# Patient Record
Sex: Male | Born: 1940
Health system: Southern US, Community
[De-identification: ages and names within clinical notes are randomized; demographics above are authoritative.]

## PROBLEM LIST (undated history)

## (undated) DIAGNOSIS — I10 Essential (primary) hypertension: Secondary | ICD-10-CM

## (undated) DIAGNOSIS — I4891 Unspecified atrial fibrillation: Secondary | ICD-10-CM

## (undated) DIAGNOSIS — I420 Dilated cardiomyopathy: Secondary | ICD-10-CM

## (undated) DIAGNOSIS — K219 Gastro-esophageal reflux disease without esophagitis: Secondary | ICD-10-CM

## (undated) DIAGNOSIS — G5603 Carpal tunnel syndrome, bilateral upper limbs: Principal | ICD-10-CM

## (undated) DIAGNOSIS — R0989 Other specified symptoms and signs involving the circulatory and respiratory systems: Secondary | ICD-10-CM

## (undated) DIAGNOSIS — I472 Ventricular tachycardia: Secondary | ICD-10-CM

## (undated) DIAGNOSIS — I4729 Other ventricular tachycardia: Secondary | ICD-10-CM

## (undated) DIAGNOSIS — G5620 Lesion of ulnar nerve, unspecified upper limb: Secondary | ICD-10-CM

## (undated) HISTORY — PX: TRANSURETHRAL RESECTION OF PROSTATE: SHX73

## (undated) HISTORY — PX: HEMORRHOID SURGERY: SHX153

## (undated) HISTORY — DX: Lesion of ulnar nerve, unspecified upper limb: G56.20

## (undated) HISTORY — PX: BACK SURGERY: SHX140

## (undated) HISTORY — DX: Dilated cardiomyopathy: I42.0

## (undated) HISTORY — DX: Ventricular tachycardia: I47.2

## (undated) HISTORY — PX: HERNIA REPAIR: SHX51

## (undated) HISTORY — PX: JOINT REPLACEMENT: SHX530

## (undated) HISTORY — PX: CARDIAC CATHETERIZATION: SHX172

## (undated) HISTORY — DX: Carpal tunnel syndrome, bilateral upper limbs: G56.03

## (undated) HISTORY — PX: FOOT SURGERY: SHX648

## (undated) HISTORY — DX: Other ventricular tachycardia: I47.29

## (undated) HISTORY — DX: Unspecified atrial fibrillation: I48.91

## (undated) HISTORY — PX: CHOLECYSTECTOMY: SHX55

---

## 2000-05-17 ENCOUNTER — Encounter (INDEPENDENT_AMBULATORY_CARE_PROVIDER_SITE_OTHER): Payer: Self-pay | Admitting: *Deleted

## 2000-05-17 ENCOUNTER — Ambulatory Visit (HOSPITAL_BASED_OUTPATIENT_CLINIC_OR_DEPARTMENT_OTHER): Admission: RE | Admit: 2000-05-17 | Discharge: 2000-05-17 | Payer: Self-pay | Admitting: Surgery

## 2009-06-25 ENCOUNTER — Encounter: Admission: RE | Admit: 2009-06-25 | Discharge: 2009-06-25 | Payer: Self-pay | Admitting: Neurosurgery

## 2009-10-01 ENCOUNTER — Encounter: Admission: RE | Admit: 2009-10-01 | Discharge: 2009-10-01 | Payer: Self-pay | Admitting: Neurosurgery

## 2009-12-23 ENCOUNTER — Inpatient Hospital Stay (HOSPITAL_COMMUNITY): Admission: RE | Admit: 2009-12-23 | Discharge: 2009-12-25 | Payer: Self-pay | Admitting: Orthopedic Surgery

## 2010-07-07 ENCOUNTER — Inpatient Hospital Stay (HOSPITAL_COMMUNITY): Admission: RE | Admit: 2010-07-07 | Discharge: 2010-07-09 | Payer: Self-pay | Admitting: Orthopedic Surgery

## 2011-01-08 LAB — DIFFERENTIAL
Basophils Relative: 1 % (ref 0–1)
Eosinophils Absolute: 0.2 10*3/uL (ref 0.0–0.7)
Eosinophils Relative: 3 % (ref 0–5)
Monocytes Absolute: 0.4 10*3/uL (ref 0.1–1.0)
Monocytes Relative: 8 % (ref 3–12)
Neutro Abs: 3.9 10*3/uL (ref 1.7–7.7)

## 2011-01-08 LAB — COMPREHENSIVE METABOLIC PANEL
ALT: 19 U/L (ref 0–53)
AST: 20 U/L (ref 0–37)
Albumin: 4.1 g/dL (ref 3.5–5.2)
Alkaline Phosphatase: 77 U/L (ref 39–117)
CO2: 28 mEq/L (ref 19–32)
Creatinine, Ser: 1 mg/dL (ref 0.4–1.5)
GFR calc Af Amer: >60 mL/min (ref >60)
Sodium: 139 mEq/L (ref 135–145)
Total Protein: 6.3 g/dL (ref 6.0–8.3)

## 2011-01-08 LAB — BASIC METABOLIC PANEL
BUN: 11 mg/dL (ref 6–23)
BUN: 23 mg/dL (ref 6–23)
CO2: 26 mEq/L (ref 19–32)
CO2: 28 mEq/L (ref 19–32)
Creatinine, Ser: 1 mg/dL (ref 0.4–1.5)
GFR calc Af Amer: >60 mL/min (ref >60)
GFR calc non Af Amer: >60 mL/min (ref >60)
Glucose, Bld: 175 mg/dL — ABNORMAL HIGH (ref 70–99)
Potassium: 4.5 mEq/L (ref 3.5–5.1)
Sodium: 136 mEq/L (ref 135–145)
Sodium: 137 mEq/L (ref 135–145)

## 2011-01-08 LAB — CBC
HCT: 29.6 % — ABNORMAL LOW (ref 39.0–52.0)
HCT: 44.2 % (ref 39.0–52.0)
Hemoglobin: 10.1 g/dL — ABNORMAL LOW (ref 13.0–17.0)
Hemoglobin: 15.7 g/dL (ref 13.0–17.0)
MCHC: 34.1 g/dL (ref 30.0–36.0)
MCHC: 34.7 g/dL (ref 30.0–36.0)
MCHC: 35.5 g/dL (ref 30.0–36.0)
MCV: 92.2 fL (ref 78.0–100.0)
Platelets: 187 10*3/uL (ref 150–400)
RBC: 3.21 MIL/uL — ABNORMAL LOW (ref 4.22–5.81)
RBC: 4.97 MIL/uL (ref 4.22–5.81)
RDW: 13.3 % (ref 11.5–15.5)
RDW: 13.7 % (ref 11.5–15.5)
WBC: 10.5 10*3/uL (ref 4.0–10.5)
WBC: 14.2 10*3/uL — ABNORMAL HIGH (ref 4.0–10.5)

## 2011-01-08 LAB — CROSSMATCH

## 2011-01-08 LAB — URINALYSIS, ROUTINE W REFLEX MICROSCOPIC
Protein, ur: NEGATIVE mg/dL
Specific Gravity, Urine: 1.018 (ref 1.005–1.030)
Urobilinogen, UA: 0.2 mg/dL (ref 0.0–1.0)

## 2011-01-08 LAB — PROTIME-INR: INR: 0.93 (ref 0.00–1.49)

## 2011-01-08 LAB — URINE CULTURE: Culture  Setup Time: 201109071438

## 2011-01-08 LAB — APTT: aPTT: 25 seconds (ref 24–37)

## 2011-01-08 LAB — SURGICAL PCR SCREEN: Staphylococcus aureus: NEGATIVE

## 2011-01-14 LAB — URINE CULTURE

## 2011-01-14 LAB — URINALYSIS, ROUTINE W REFLEX MICROSCOPIC
Glucose, UA: NEGATIVE mg/dL
Hgb urine dipstick: NEGATIVE
Protein, ur: NEGATIVE mg/dL
Specific Gravity, Urine: 1.013 (ref 1.005–1.030)
Urobilinogen, UA: 0.2 mg/dL (ref 0.0–1.0)
pH: 7 (ref 5.0–8.0)

## 2011-01-14 LAB — DIFFERENTIAL
Basophils Absolute: 0 10*3/uL (ref 0.0–0.1)
Basophils Relative: 0 % (ref 0–1)
Lymphocytes Relative: 23 % (ref 12–46)
Lymphs Abs: 1.5 10*3/uL (ref 0.7–4.0)
Monocytes Relative: 9 % (ref 3–12)
Neutro Abs: 4.1 10*3/uL (ref 1.7–7.7)
Neutrophils Relative %: 66 % (ref 43–77)

## 2011-01-14 LAB — CROSSMATCH
ABO/RH(D): A POS
Antibody Screen: NEGATIVE

## 2011-01-14 LAB — CBC
Hemoglobin: 14.9 g/dL (ref 13.0–17.0)
MCV: 93 fL (ref 78.0–100.0)
Platelets: 252 10*3/uL (ref 150–400)

## 2011-01-14 LAB — COMPREHENSIVE METABOLIC PANEL
BUN: 12 mg/dL (ref 6–23)
CO2: 28 mEq/L (ref 19–32)
Chloride: 103 mEq/L (ref 96–112)
GFR calc Af Amer: >60 mL/min (ref >60)
GFR calc non Af Amer: >60 mL/min (ref >60)
Glucose, Bld: 100 mg/dL — ABNORMAL HIGH (ref 70–99)
Potassium: 5.1 mEq/L (ref 3.5–5.1)

## 2011-01-14 LAB — APTT: aPTT: 28 seconds (ref 24–37)

## 2011-01-16 LAB — CBC
HCT: 27.7 % — ABNORMAL LOW (ref 39.0–52.0)
MCV: 94.1 fL (ref 78.0–100.0)
Platelets: 192 10*3/uL (ref 150–400)
RBC: 2.95 MIL/uL — ABNORMAL LOW (ref 4.22–5.81)
WBC: 10.5 10*3/uL (ref 4.0–10.5)
WBC: 9.6 10*3/uL (ref 4.0–10.5)

## 2011-01-16 LAB — BASIC METABOLIC PANEL
BUN: 11 mg/dL (ref 6–23)
Chloride: 100 mEq/L (ref 96–112)
Creatinine, Ser: 1.03 mg/dL (ref 0.4–1.5)
GFR calc Af Amer: >60 mL/min (ref >60)
GFR calc non Af Amer: >60 mL/min (ref >60)
Potassium: 3.8 mEq/L (ref 3.5–5.1)
Potassium: 4 mEq/L (ref 3.5–5.1)
Sodium: 131 mEq/L — ABNORMAL LOW (ref 135–145)

## 2011-03-13 NOTE — Op Note (Signed)
[place]  Patient:    Adam Bennett, Adam Bennett                            MRN: [id number] Proc. Date: 05/17/00 Attending:  Currie Paris, M.D.                           Operative Report  OFFICE MR# [id number]  PREOPERATIVE DIAGNOSIS:  Left inguinal hernia.  POSTOPERATIVE DIAGNOSIS:  Left inguinal hernia, indirect.  OPERATION:  Repair of left inguinal hernia.  SURGEON:  Dr. Jamey Ripa.  ANESTHESIA:  MAC.  HISTORY OF PRESENT ILLNESS:  The patient is a 70 year old with a recently acquired left inguinal hernia.  He desired to have this fixed.  DESCRIPTION OF PROCEDURE:  The patient was brought to the operating room, and the area was shaved, prepped and draped.  He was given IV sedation.  A combination of 1% Xylocaine with epinephrine and 0.5% Marcaine plain was mixed equally and used for local.  The area was infiltrated over the incision line as well as fascially above the anterior superior iliac spine.  The incision was made and deepened to the external oblique aponeurosis with additional local infiltration as needed.  Bleeders were electrocoagulated, clamped, and tied with 4-0 Vicryl.  External oblique was opened in line with its fibers. There was fairly diffuse indirect hernia present.  I was able to get around the cord and surround it with a Penrose drain.  The hernia contents were reduced easily, and sac was then stripped off the cord down to its base where it was twisted, clamped, and tied with 2-0 silk suture ligature followed by 2-0 silk tie and amputated.  It retracted neatly and into the deep ring. There was also some fatty tissue protruding out that was just lateral to the hernia sac, and this was trimmed off.  In addition, there was another second piece of what appeared to be preperitoneal material protruding medial to the sac, and this was almost like a second defect, but was lateral to the vessels. This was reduced, and I placed a small mesh  plug into the deep ring, and held it with some 2-0 Prolenes to include the defect.  I then placed a mesh patch on as an onlay graft over the entire inguinal floor, and sutured in with a running suture laterally and inferiorly, starting medially and working laterally.  It was tacked medially to the internal oblique and the tail was crossed laterally to extend well pass the deep ring. The ilio-hypergastric nerve was above this, and the ilioinguinal nerve came through the slit along with cord structures.  The wound appeared to be dry.  I closed the external oblique with 3-0 Vicryl, Scarpas with 3-0 Vicryl, and the skin with 4-0 Monocryl subcuticular plus Steri-Strips.  The patient tolerated the procedure well.  There were no intraoperative complications.  All counts were correct. DD:  05/17/00 TD:  05/18/00 Job: [id number]

## 2011-03-25 IMAGING — CR DG CHEST 2V
2 series · 2 of 2 positions shown · non-contrast
Comparison: None.

CLINICAL DATA: Preoperative film in patient for knee surgery.

CHEST - 2 VIEW

[view not recorded (1 of 2)]
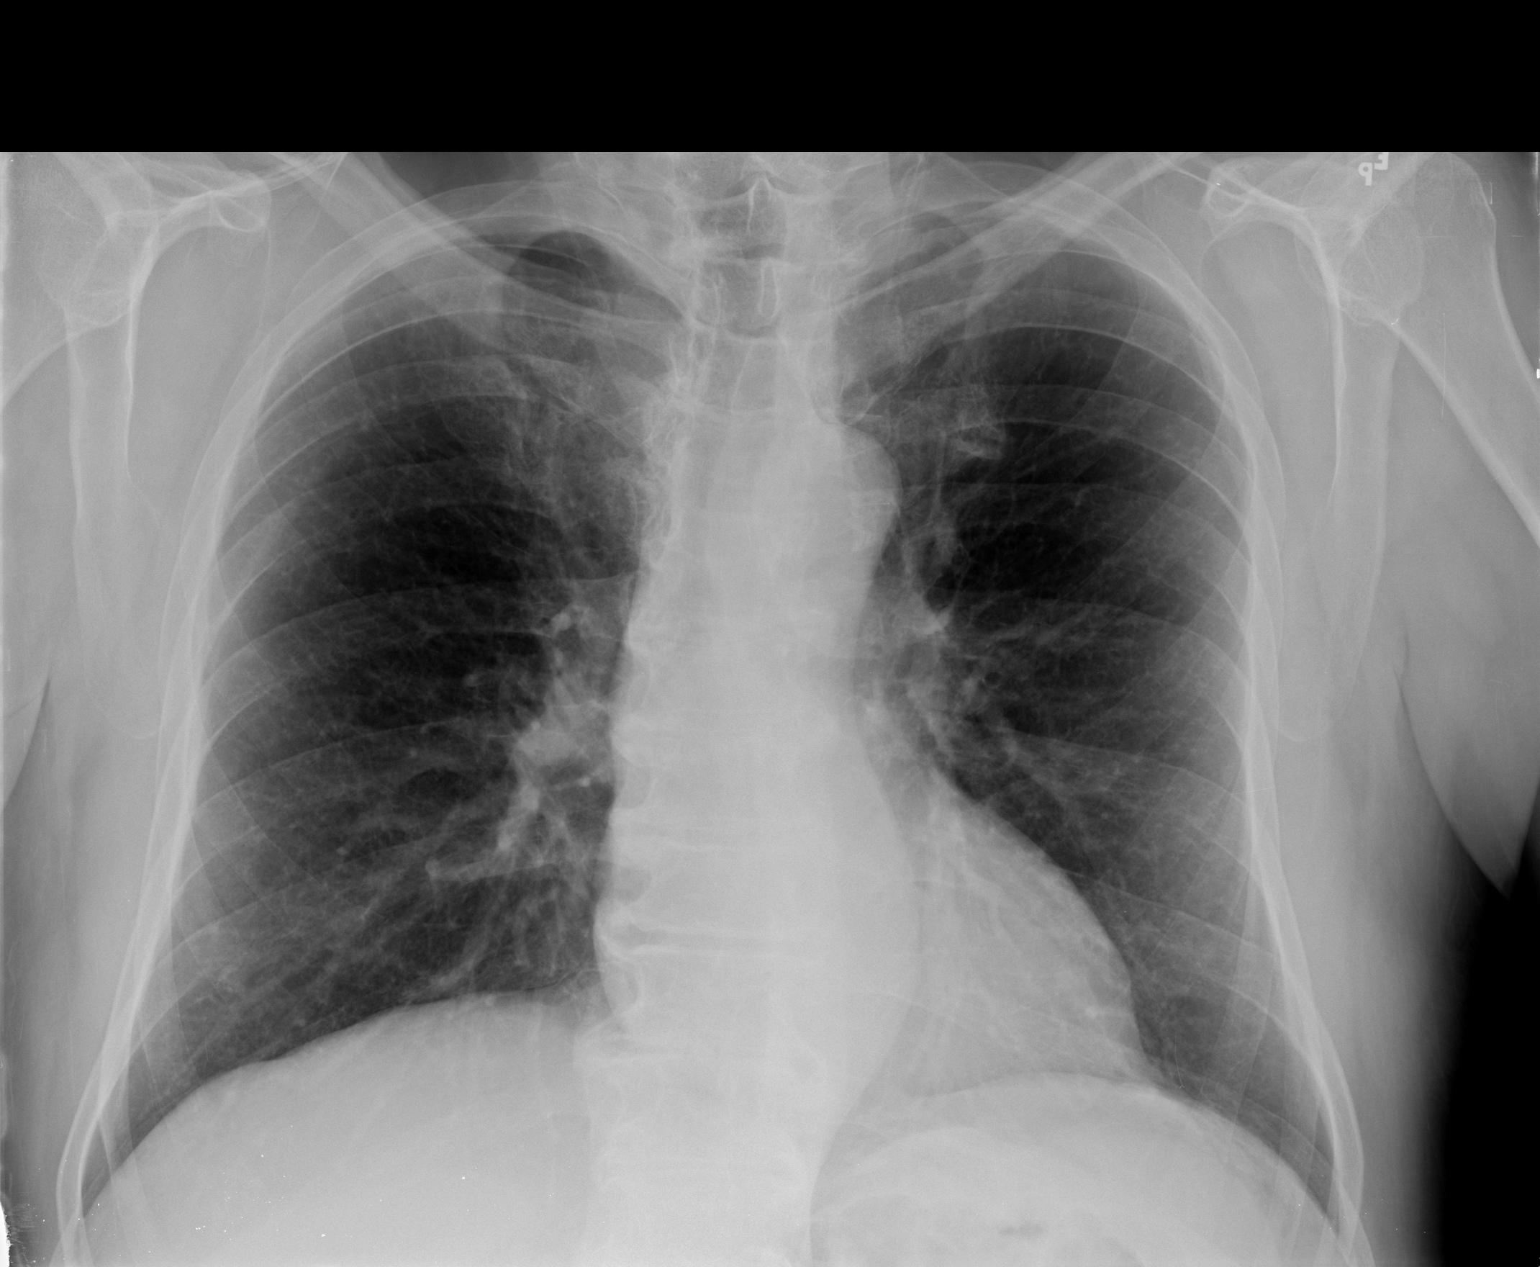

[view not recorded (2 of 2)]
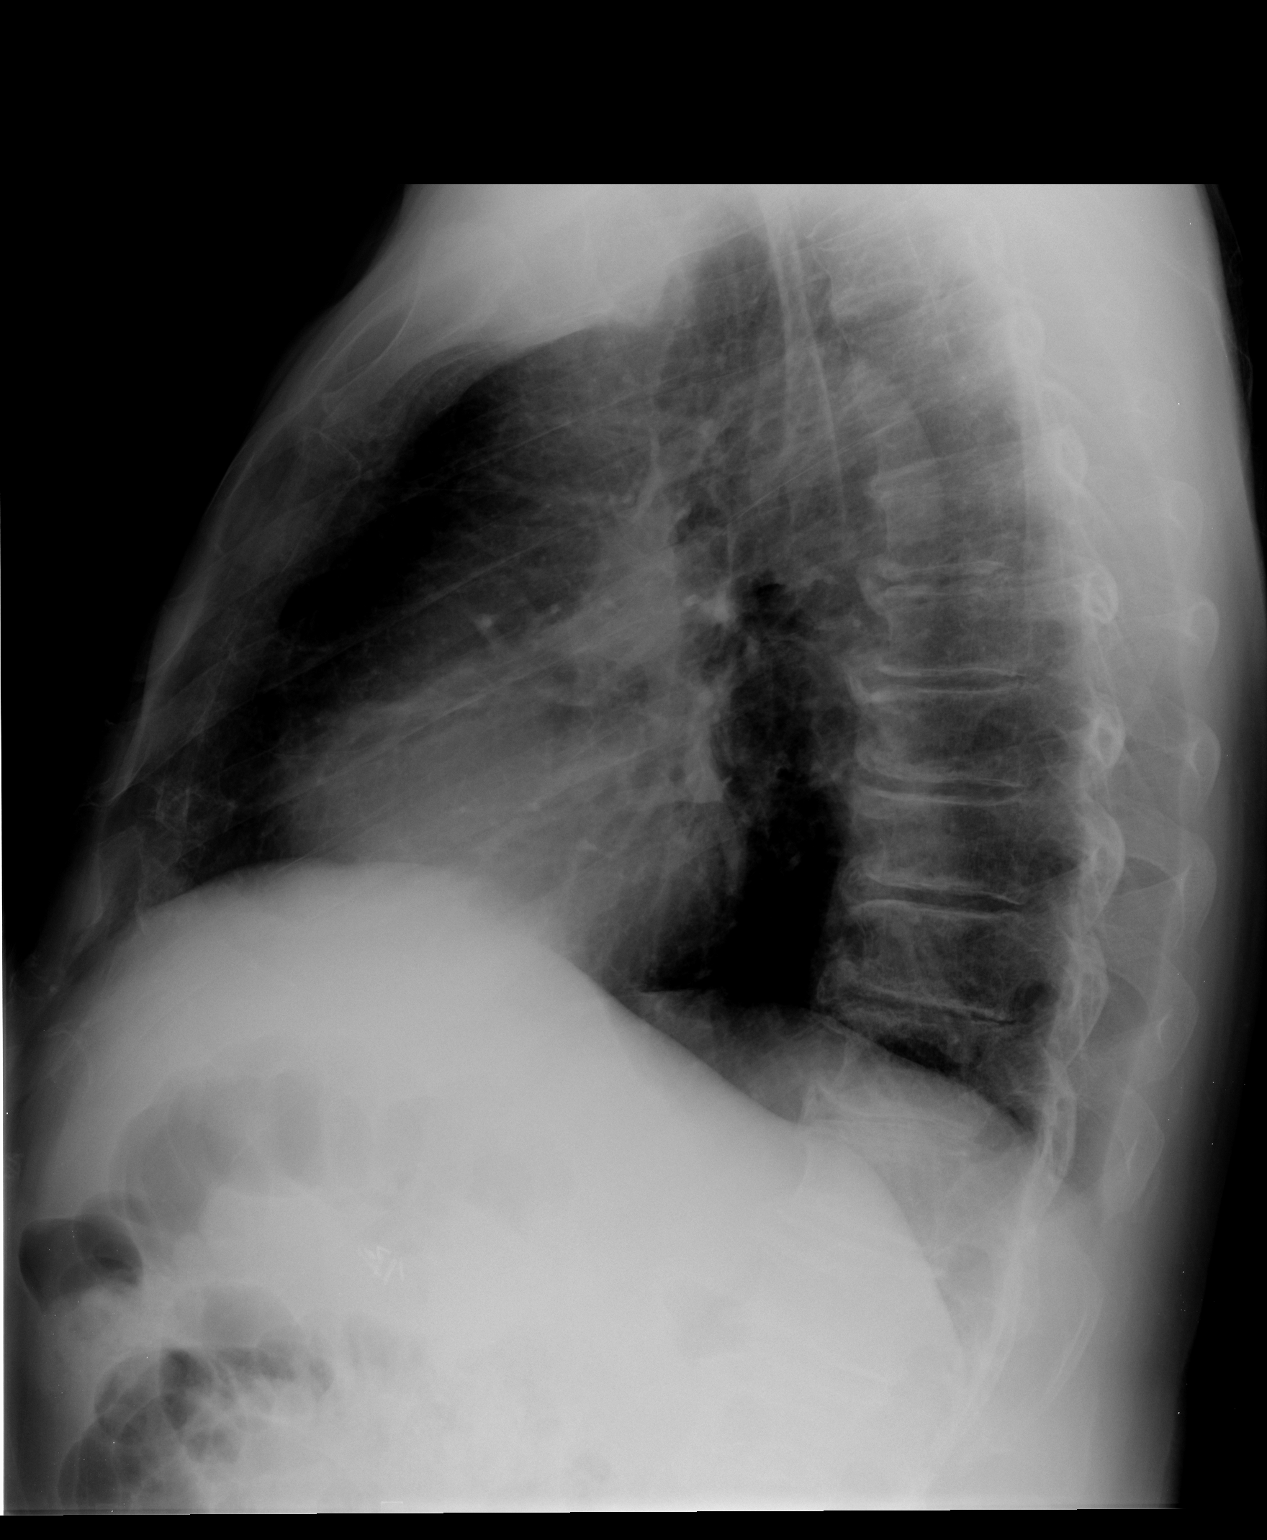

[2 of 2 positions shown; findings below may reference images not displayed]

FINDINGS: Lungs are clear.  No effusion.  Heart size normal.
IMPRESSION: No acute disease.

## 2014-06-15 ENCOUNTER — Encounter (HOSPITAL_COMMUNITY): Payer: Self-pay | Admitting: Pharmacy Technician

## 2014-06-15 ENCOUNTER — Other Ambulatory Visit: Payer: Self-pay | Admitting: Neurosurgery

## 2014-06-20 ENCOUNTER — Encounter (HOSPITAL_COMMUNITY): Payer: Self-pay

## 2014-06-20 ENCOUNTER — Encounter (HOSPITAL_COMMUNITY)
Admission: RE | Admit: 2014-06-20 | Discharge: 2014-06-20 | Disposition: A | Discharge status: Home / Self Care | Payer: Medicare Other | Source: Ambulatory Visit | Attending: Neurosurgery | Admitting: Neurosurgery

## 2014-06-20 HISTORY — DX: Gastro-esophageal reflux disease without esophagitis: K21.9

## 2014-06-20 HISTORY — DX: Essential (primary) hypertension: I10

## 2014-06-20 LAB — BASIC METABOLIC PANEL
ANION GAP: 13 (ref 5–15)
BUN: 14 mg/dL (ref 6–23)
CALCIUM: 9.4 mg/dL (ref 8.4–10.5)
CO2: 26 meq/L (ref 19–32)
CREATININE: 0.89 mg/dL (ref 0.50–1.35)
Chloride: 102 mEq/L (ref 96–112)
GFR calc Af Amer: >90 mL/min (ref >90)
GFR, EST NON AFRICAN AMERICAN: 84 mL/min — AB (ref >90)
Glucose, Bld: 109 mg/dL — ABNORMAL HIGH (ref 70–99)
Potassium: 4.8 mEq/L (ref 3.7–5.3)
SODIUM: 141 meq/L (ref 137–147)

## 2014-06-20 LAB — CBC
HCT: 44.9 % (ref 39.0–52.0)
Hemoglobin: 15.5 g/dL (ref 13.0–17.0)
MCH: 30.2 pg (ref 26.0–34.0)
MCHC: 34.5 g/dL (ref 30.0–36.0)
MCV: 87.5 fL (ref 78.0–100.0)
Platelets: 221 10*3/uL (ref 150–400)
RBC: 5.13 MIL/uL (ref 4.22–5.81)
RDW: 13.7 % (ref 11.5–15.5)
WBC: 8.8 10*3/uL (ref 4.0–10.5)

## 2014-06-20 LAB — SURGICAL PCR SCREEN
MRSA, PCR: NEGATIVE
STAPHYLOCOCCUS AUREUS: NEGATIVE

## 2014-06-20 NOTE — Pre-Procedure Instructions (Signed)
Adam Bennett  06/20/2014   Your procedure is scheduled on:  Friday, August 28 @ 9am  Report to [facility] Admitting at 6 AM.  Call this number if you have problems the morning of surgery: [phone]   Remember:   Do not eat food or drink liquids after midnight.   Take these medicines the morning of surgery with A SIP OF WATER: amLODipine (NORVASC), Chlorpheniramine Maleate(allergy), omeprazole (PRILOSEC), tetrahydrozoline  (VISINE)  STOP NAPROXEN ONE WEEK PRIOR TO SURGERY    Do not wear jewelry.  Do not wear lotions, powders, orcolognes. You may wear deodorant.  Men may shave face and neck.  Do not bring valuables to the hospital.  [facility] is not responsible    for any belongings or valuables.               Contacts, dentures or bridgework may not be worn into surgery.  Leave suitcase in the car. After surgery it may be brought to your room.  For patients admitted to the hospital, discharge time is determined by your                treatment team.               Patients discharged the day of surgery will not be allowed to drive  home.  Name and phone number of your driver:      Please read over the following fact sheets that you were given: Pain Booklet, Coughing and Deep Breathing, Blood Transfusion Information and Surgical Site Infection Prevention

## 2014-06-20 NOTE — Progress Notes (Signed)
This patient scored at an elevated risk for obstructive sleep apnea using the STOP BANG TOOL during a pre surgical screening.

## 2014-06-21 MED ORDER — CEFAZOLIN SODIUM-DEXTROSE 2-3 GM-% IV SOLR
2.0000 g | INTRAVENOUS | Status: AC
  Administered 2014-06-22: 2 g via INTRAVENOUS
  Filled 2014-06-21: qty 50

## 2014-06-21 NOTE — Anesthesia Preprocedure Evaluation (Addendum)
Anesthesia Evaluation   Patient awake    Reviewed: Allergy & Precautions, H&P , NPO status , Patient's Chart, lab work & pertinent test results  Airway Mallampati: III TM Distance: >3 FB Neck ROM: Full    Dental  (+) Teeth Intact, Dental Advisory Given   Pulmonary former smoker,          Cardiovascular hypertension, Pt. on medications  ECHO 05/2014 normal LV function.     Neuro/Psych    GI/Hepatic   Endo/Other    Renal/GU      Musculoskeletal   Abdominal   Peds  Hematology   Anesthesia Other Findings   Reproductive/Obstetrics                        Anesthesia Physical Anesthesia Plan  ASA: III  Anesthesia Plan: General   Post-op Pain Management:    Induction: Intravenous  Airway Management Planned: Oral ETT  Additional Equipment:   Intra-op Plan:   Post-operative Plan: Extubation in OR  Informed Consent: I have reviewed the patients History and Physical, chart, labs and discussed the procedure including the risks, benefits and alternatives for the proposed anesthesia with the patient or authorized representative who has indicated his/her understanding and acceptance.     Plan Discussed with:   Anesthesia Plan Comments:         Anesthesia Quick Evaluation

## 2014-06-21 NOTE — H&P (Signed)
Adam Bennett is an 73 y.o. male.   Chief Complaint: lumbar pain HPI: patient complaining of lumbar pain with radiation to both lower extremities with worsening while walking few blocks. i saw him 6 years ago  With lumbar pain and a that time we knew he had lumbar spondylolisthesis  Past Medical History  Diagnosis Date  . Hypertension   . GERD (gastroesophageal reflux disease)     Past Surgical History  Procedure Laterality Date  . Joint replacement      BILATER KNEE REPLACEMENTS  . Cardiac catheterization      DONE IN [place], 59YRS AGO    No family history on file. Social History:  reports that he has quit smoking. He does not have any smokeless tobacco history on file. He reports that he does not drink alcohol or use illicit drugs.  Allergies:  Allergies  Allergen Reactions  . Norco [Hydrocodone-Acetaminophen] Itching    No prescriptions prior to admission    Results for orders placed during the hospital encounter of 06/20/14 (from the past 48 hour(s))  SURGICAL PCR SCREEN     Status: None   Collection Time    06/20/14  1:44 PM      Result Value Ref Range   MRSA, PCR NEGATIVE  NEGATIVE   Staphylococcus aureus NEGATIVE  NEGATIVE   Comment:            The Xpert SA Assay (FDA     approved for NASAL specimens     in patients over 26 years of age),     is one component of     a comprehensive surveillance     program.  Test performance has     been validated by [employer] for patients greater     than or equal to 9 year old.     It is not intended     to diagnose infection nor to     guide or monitor treatment.  BASIC METABOLIC PANEL     Status: Abnormal   Collection Time    06/20/14  1:57 PM      Result Value Ref Range   Sodium 141  137 - 147 mEq/L   Potassium 4.8  3.7 - 5.3 mEq/L   Chloride 102  96 - 112 mEq/L   CO2 26  19 - 32 mEq/L   Glucose, Bld 109 (*) 70 - 99 mg/dL   BUN 14  6 - 23 mg/dL   Creatinine, Ser 0.89  0.50 - 1.35 mg/dL   Calcium 9.4   8.4 - 10.5 mg/dL   GFR calc non Af Amer 84 (*) >90 mL/min   GFR calc Af Amer >90  >90 mL/min   Comment: (NOTE)     The eGFR has been calculated using the CKD EPI equation.     This calculation has not been validated in all clinical situations.     eGFR's persistently <90 mL/min signify possible Chronic Kidney     Disease.   Anion gap 13  5 - 15  CBC     Status: None   Collection Time    06/20/14  1:57 PM      Result Value Ref Range   WBC 8.8  4.0 - 10.5 K/uL   RBC 5.13  4.22 - 5.81 MIL/uL   Hemoglobin 15.5  13.0 - 17.0 g/dL   HCT 44.9  39.0 - 52.0 %   MCV 87.5  78.0 - 100.0 fL  MCH 30.2  26.0 - 34.0 pg   MCHC 34.5  30.0 - 36.0 g/dL   RDW 13.7  11.5 - 15.5 %   Platelets 221  150 - 400 K/uL  TYPE AND SCREEN     Status: None   Collection Time    06/20/14  1:58 PM      Result Value Ref Range   ABO/RH(D) A POS     Antibody Screen NEG     Sample Expiration 07/04/2014     Dg Chest 2 View  06/20/2014   CLINICAL DATA:  Preop for lumbar surgery  EXAM: CHEST  2 VIEW  COMPARISON:  12/17/2009  FINDINGS: Cardiomediastinal silhouette is stable. No acute infiltrate or pleural effusion. No pulmonary edema. Degenerative changes thoracic spine.  IMPRESSION: No active cardiopulmonary disease.   Electronically Signed   By: Lahoma Crocker M.D.   On: 06/20/2014 14:39    Review of Systems  Constitutional: Negative.   HENT: Negative.   Respiratory: Negative.   Cardiovascular:       Arterial hypertension  Gastrointestinal: Negative.   Genitourinary: Negative.   Musculoskeletal: Positive for back pain.  Skin: Negative.   Neurological: Positive for sensory change and focal weakness.  Endo/Heme/Allergies: Negative.   Psychiatric/Behavioral: Negative.     There were no vitals taken for this visit. Physical Exam hent, nl. Neck, nl. Cv, nl.lungs, clear. Abdomen, soft. Extremities, nl. NEURO 3/5 WEAKNESS OF FEET df. SLR POSITIVE AT 80 DEGREES. MRI SHOWS STENOSIS AT L3-4, 4-5 WITH SPONDYLOLISTHESIS AT  L4-5.   Assessment/Plan Decompression at l34,45 with fusion at l4-5, using cages and pedicle screws. He and his wife are aware of risks and benefits.had cardiac clearance  Tarique Loveall M 06/21/2014, 5:58 PM

## 2014-06-21 NOTE — Progress Notes (Signed)
Anesthesia Chart Review:  Patient is a 73 year old male scheduled for L3 laminectomy and L4-5 diskectomy/posterolateral arthrodesis on 06/22/14 by Dr. Joya Salm.  History includes HTN, GERD, former smoker. He reported a cardiac cath at [facility] 7 years ago.  Cardiologist is Dr. Agustin Cree at [facility] who cleared patient for this procedure.  There is no mention of CAD or CHF history in his 06/11/14 note.   Echo on 06/14/14 [facility]) showed: Technically difficult study with suboptimal views. No clear views of subcostal.  Overall LV systolic function with normal EF between 55-60%. Diastolic filling pattern indicates impaired relaxation. LA is mildly dilated.   EKG on 06/20/14 showed NSR, LAD. Less voltage criteria for LVH when compared to prior tracing.  Preoperative labs and CXR noted.   Anticipate that he can proceed as planned.  George Hugh [facility] Short Stay Center/Anesthesiology Phone [phone] 06/21/2014 4:59 PM

## 2014-06-22 ENCOUNTER — Encounter (HOSPITAL_COMMUNITY)
Admission: RE | Disposition: A | Discharge status: Skilled Nursing Facility | Payer: Medicare Other | Source: Ambulatory Visit | Attending: Neurosurgery

## 2014-06-22 ENCOUNTER — Inpatient Hospital Stay (HOSPITAL_COMMUNITY): Payer: Medicare Other | Admitting: Anesthesiology

## 2014-06-22 ENCOUNTER — Inpatient Hospital Stay (HOSPITAL_COMMUNITY): Payer: Medicare Other

## 2014-06-22 ENCOUNTER — Inpatient Hospital Stay (HOSPITAL_COMMUNITY)
Admission: RE | Admit: 2014-06-22 | Discharge: 2014-06-29 | DRG: 460 | Disposition: A | Discharge status: Skilled Nursing Facility | Payer: Medicare Other | Source: Ambulatory Visit | Attending: Neurosurgery | Admitting: Neurosurgery

## 2014-06-22 ENCOUNTER — Encounter (HOSPITAL_COMMUNITY): Payer: Self-pay | Admitting: *Deleted

## 2014-06-22 ENCOUNTER — Encounter (HOSPITAL_COMMUNITY): Payer: Medicare Other | Admitting: Vascular Surgery

## 2014-06-22 DIAGNOSIS — Z7982 Long term (current) use of aspirin: Secondary | ICD-10-CM

## 2014-06-22 DIAGNOSIS — M4310 Spondylolisthesis, site unspecified: Principal | ICD-10-CM | POA: Diagnosis present

## 2014-06-22 DIAGNOSIS — R55 Syncope and collapse: Secondary | ICD-10-CM | POA: Diagnosis not present

## 2014-06-22 DIAGNOSIS — K644 Residual hemorrhoidal skin tags: Secondary | ICD-10-CM | POA: Diagnosis not present

## 2014-06-22 DIAGNOSIS — I480 Paroxysmal atrial fibrillation: Secondary | ICD-10-CM

## 2014-06-22 DIAGNOSIS — J9819 Other pulmonary collapse: Secondary | ICD-10-CM | POA: Diagnosis not present

## 2014-06-22 DIAGNOSIS — Z79899 Other long term (current) drug therapy: Secondary | ICD-10-CM

## 2014-06-22 DIAGNOSIS — M48061 Spinal stenosis, lumbar region without neurogenic claudication: Secondary | ICD-10-CM | POA: Diagnosis present

## 2014-06-22 DIAGNOSIS — R339 Retention of urine, unspecified: Secondary | ICD-10-CM | POA: Diagnosis not present

## 2014-06-22 DIAGNOSIS — K219 Gastro-esophageal reflux disease without esophagitis: Secondary | ICD-10-CM | POA: Diagnosis present

## 2014-06-22 DIAGNOSIS — I4891 Unspecified atrial fibrillation: Secondary | ICD-10-CM | POA: Diagnosis not present

## 2014-06-22 DIAGNOSIS — I959 Hypotension, unspecified: Secondary | ICD-10-CM | POA: Diagnosis not present

## 2014-06-22 DIAGNOSIS — M549 Dorsalgia, unspecified: Secondary | ICD-10-CM | POA: Diagnosis present

## 2014-06-22 DIAGNOSIS — Z87891 Personal history of nicotine dependence: Secondary | ICD-10-CM

## 2014-06-22 DIAGNOSIS — I10 Essential (primary) hypertension: Secondary | ICD-10-CM | POA: Diagnosis present

## 2014-06-22 DIAGNOSIS — M4316 Spondylolisthesis, lumbar region: Secondary | ICD-10-CM

## 2014-06-22 DIAGNOSIS — Z96659 Presence of unspecified artificial knee joint: Secondary | ICD-10-CM | POA: Diagnosis not present

## 2014-06-22 DIAGNOSIS — E860 Dehydration: Secondary | ICD-10-CM | POA: Diagnosis not present

## 2014-06-22 DIAGNOSIS — Z885 Allergy status to narcotic agent status: Secondary | ICD-10-CM

## 2014-06-22 DIAGNOSIS — D649 Anemia, unspecified: Secondary | ICD-10-CM | POA: Diagnosis not present

## 2014-06-22 DIAGNOSIS — Z8249 Family history of ischemic heart disease and other diseases of the circulatory system: Secondary | ICD-10-CM | POA: Diagnosis not present

## 2014-06-22 DIAGNOSIS — R319 Hematuria, unspecified: Secondary | ICD-10-CM | POA: Diagnosis not present

## 2014-06-22 HISTORY — DX: Spondylolisthesis, lumbar region: M43.16

## 2014-06-22 HISTORY — DX: Other specified symptoms and signs involving the circulatory and respiratory systems: R09.89

## 2014-06-22 LAB — PREPARE RBC (CROSSMATCH)

## 2014-06-22 SURGERY — POSTERIOR LUMBAR FUSION 1 LEVEL
Anesthesia: General | Site: Back

## 2014-06-22 MED ORDER — SODIUM CHLORIDE 0.9 % IJ SOLN: 3.0000 mL | INTRAMUSCULAR | Status: DC | PRN

## 2014-06-22 MED ORDER — PROMETHAZINE HCL 25 MG/ML IJ SOLN
6.2500 mg | INTRAMUSCULAR | Status: DC | PRN
Start: 2014-06-22 — End: 2014-06-22

## 2014-06-22 MED ORDER — NALOXONE HCL 0.4 MG/ML IJ SOLN: 0.4000 mg | INTRAMUSCULAR | Status: DC | PRN

## 2014-06-22 MED ORDER — FENTANYL CITRATE 0.05 MG/ML IJ SOLN
INTRAMUSCULAR | Status: DC | PRN
  Administered 2014-06-22 (×3): 50 ug via INTRAVENOUS
  Administered 2014-06-22: 100 ug via INTRAVENOUS
  Administered 2014-06-22 (×2): 50 ug via INTRAVENOUS

## 2014-06-22 MED ORDER — FENTANYL 10 MCG/ML IV SOLN
INTRAVENOUS | Status: DC
  Administered 2014-06-22: 75 ug/h via INTRAVENOUS
  Filled 2014-06-22 (×2): qty 50

## 2014-06-22 MED ORDER — LIDOCAINE HCL (CARDIAC) 20 MG/ML IV SOLN
INTRAVENOUS | Status: AC
  Filled 2014-06-22: qty 5

## 2014-06-22 MED ORDER — ALBUMIN HUMAN 5 % IV SOLN
INTRAVENOUS | Status: DC | PRN
  Administered 2014-06-22 (×2): via INTRAVENOUS

## 2014-06-22 MED ORDER — SODIUM CHLORIDE 0.9 % IV SOLN
INTRAVENOUS | Status: DC
  Administered 2014-06-23 (×2): via INTRAVENOUS

## 2014-06-22 MED ORDER — AMLODIPINE BESYLATE 10 MG PO TABS
10.0000 mg | ORAL_TABLET | Freq: Every day | ORAL | Status: DC
  Administered 2014-06-23 – 2014-06-25 (×3): 10 mg via ORAL
  Filled 2014-06-22 (×4): qty 1

## 2014-06-22 MED ORDER — FENTANYL CITRATE 0.05 MG/ML IJ SOLN
INTRAMUSCULAR | Status: AC
  Filled 2014-06-22: qty 2

## 2014-06-22 MED ORDER — ROCURONIUM BROMIDE 50 MG/5ML IV SOLN
INTRAVENOUS | Status: AC
  Filled 2014-06-22: qty 1

## 2014-06-22 MED ORDER — ACETAMINOPHEN 325 MG PO TABS
650.0000 mg | ORAL_TABLET | ORAL | Status: DC | PRN
  Administered 2014-06-22 – 2014-06-27 (×7): 650 mg via ORAL
  Filled 2014-06-22 (×7): qty 2

## 2014-06-22 MED ORDER — CEFAZOLIN SODIUM 1-5 GM-% IV SOLN
1.0000 g | Freq: Three times a day (TID) | INTRAVENOUS | Status: AC
  Administered 2014-06-22 – 2014-06-23 (×2): 1 g via INTRAVENOUS
  Filled 2014-06-22 (×2): qty 50

## 2014-06-22 MED ORDER — PHENOL 1.4 % MT LIQD
1.0000 | OROMUCOSAL | Status: DC | PRN
  Administered 2014-06-22: 1 via OROMUCOSAL
  Filled 2014-06-22: qty 177

## 2014-06-22 MED ORDER — SODIUM CHLORIDE 0.9 % IJ SOLN: 9.0000 mL | INTRAMUSCULAR | Status: DC | PRN

## 2014-06-22 MED ORDER — 0.9 % SODIUM CHLORIDE (POUR BTL) OPTIME
TOPICAL | Status: DC | PRN
  Administered 2014-06-22: 1000 mL

## 2014-06-22 MED ORDER — ROCURONIUM BROMIDE 100 MG/10ML IV SOLN
INTRAVENOUS | Status: DC | PRN
  Administered 2014-06-22: 10 mg via INTRAVENOUS
  Administered 2014-06-22: 20 mg via INTRAVENOUS
  Administered 2014-06-22: 50 mg via INTRAVENOUS
  Administered 2014-06-22 (×2): 10 mg via INTRAVENOUS

## 2014-06-22 MED ORDER — LACTATED RINGERS IV SOLN
INTRAVENOUS | Status: DC | PRN
  Administered 2014-06-22 (×3): via INTRAVENOUS

## 2014-06-22 MED ORDER — FENTANYL CITRATE 0.05 MG/ML IJ SOLN
25.0000 ug | INTRAMUSCULAR | Status: DC | PRN
  Administered 2014-06-22 (×3): 50 ug via INTRAVENOUS

## 2014-06-22 MED ORDER — GLYCOPYRROLATE 0.2 MG/ML IJ SOLN
INTRAMUSCULAR | Status: DC | PRN
  Administered 2014-06-22: .8 mg via INTRAVENOUS

## 2014-06-22 MED ORDER — PROPOFOL 10 MG/ML IV BOLUS
INTRAVENOUS | Status: DC | PRN
  Administered 2014-06-22: 170 mg via INTRAVENOUS

## 2014-06-22 MED ORDER — ONDANSETRON HCL 4 MG/2ML IJ SOLN
INTRAMUSCULAR | Status: AC
  Filled 2014-06-22: qty 2

## 2014-06-22 MED ORDER — ZOLPIDEM TARTRATE 5 MG PO TABS: 5.0000 mg | ORAL_TABLET | Freq: Every evening | ORAL | Status: DC | PRN

## 2014-06-22 MED ORDER — ACETAMINOPHEN 10 MG/ML IV SOLN
INTRAVENOUS | Status: AC
  Administered 2014-06-22: 1000 mg via INTRAVENOUS
  Filled 2014-06-22: qty 100

## 2014-06-22 MED ORDER — LACTATED RINGERS IV SOLN
INTRAVENOUS | Status: DC
  Administered 2014-06-22: 50 mL/h via INTRAVENOUS
  Administered 2014-06-25: 21:00:00 via INTRAVENOUS

## 2014-06-22 MED ORDER — LIDOCAINE HCL (CARDIAC) 20 MG/ML IV SOLN
INTRAVENOUS | Status: DC | PRN
  Administered 2014-06-22: 50 mg via INTRAVENOUS

## 2014-06-22 MED ORDER — SODIUM CHLORIDE 0.9 % IJ SOLN
3.0000 mL | Freq: Two times a day (BID) | INTRAMUSCULAR | Status: DC
  Administered 2014-06-25 – 2014-06-28 (×4): 3 mL via INTRAVENOUS

## 2014-06-22 MED ORDER — DIAZEPAM 5 MG PO TABS
ORAL_TABLET | ORAL | Status: AC
  Filled 2014-06-22: qty 2

## 2014-06-22 MED ORDER — VANCOMYCIN HCL 1000 MG IV SOLR
INTRAVENOUS | Status: AC
  Filled 2014-06-22: qty 1000

## 2014-06-22 MED ORDER — NEOSTIGMINE METHYLSULFATE 10 MG/10ML IV SOLN
INTRAVENOUS | Status: DC | PRN
  Administered 2014-06-22: 5 mg via INTRAVENOUS

## 2014-06-22 MED ORDER — LISINOPRIL 5 MG PO TABS
5.0000 mg | ORAL_TABLET | Freq: Every day | ORAL | Status: DC
  Administered 2014-06-23 – 2014-06-25 (×3): 5 mg via ORAL
  Filled 2014-06-22 (×4): qty 1

## 2014-06-22 MED ORDER — ONDANSETRON HCL 4 MG/2ML IJ SOLN
INTRAMUSCULAR | Status: DC | PRN
  Administered 2014-06-22: 4 mg via INTRAVENOUS

## 2014-06-22 MED ORDER — ACETAMINOPHEN 650 MG RE SUPP: 650.0000 mg | RECTAL | Status: DC | PRN

## 2014-06-22 MED ORDER — PHENYLEPHRINE 40 MCG/ML (10ML) SYRINGE FOR IV PUSH (FOR BLOOD PRESSURE SUPPORT)
PREFILLED_SYRINGE | INTRAVENOUS | Status: AC
  Filled 2014-06-22: qty 10

## 2014-06-22 MED ORDER — PROPOFOL 10 MG/ML IV BOLUS
INTRAVENOUS | Status: AC
  Filled 2014-06-22: qty 20

## 2014-06-22 MED ORDER — DIAZEPAM 5 MG PO TABS
10.0000 mg | ORAL_TABLET | Freq: Four times a day (QID) | ORAL | Status: DC | PRN
  Administered 2014-06-22 – 2014-06-25 (×9): 10 mg via ORAL
  Filled 2014-06-22 (×11): qty 2

## 2014-06-22 MED ORDER — VANCOMYCIN HCL 1000 MG IV SOLR
INTRAVENOUS | Status: DC | PRN
  Administered 2014-06-22: 1000 mg

## 2014-06-22 MED ORDER — DIPHENHYDRAMINE HCL 12.5 MG/5ML PO ELIX: 12.5000 mg | ORAL_SOLUTION | Freq: Four times a day (QID) | ORAL | Status: DC | PRN

## 2014-06-22 MED ORDER — FENTANYL CITRATE 0.05 MG/ML IJ SOLN
INTRAMUSCULAR | Status: AC
  Filled 2014-06-22: qty 5

## 2014-06-22 MED ORDER — MEPERIDINE HCL 25 MG/ML IJ SOLN
6.2500 mg | INTRAMUSCULAR | Status: DC | PRN
Start: 2014-06-22 — End: 2014-06-22

## 2014-06-22 MED ORDER — SODIUM CHLORIDE 0.9 % IV SOLN
250.0000 mL | INTRAVENOUS | Status: DC
  Administered 2014-06-27: 250 mL via INTRAVENOUS

## 2014-06-22 MED ORDER — ONDANSETRON HCL 4 MG/2ML IJ SOLN: 4.0000 mg | Freq: Four times a day (QID) | INTRAMUSCULAR | Status: DC | PRN

## 2014-06-22 MED ORDER — FENTANYL 10 MCG/ML IV SOLN
INTRAVENOUS | Status: DC
  Administered 2014-06-22: 30 ug via INTRAVENOUS
  Administered 2014-06-22: 15:00:00 via INTRAVENOUS
  Filled 2014-06-22 (×2): qty 50

## 2014-06-22 MED ORDER — PHENYLEPHRINE HCL 10 MG/ML IJ SOLN
INTRAMUSCULAR | Status: DC | PRN
  Administered 2014-06-22: 80 ug via INTRAVENOUS
  Administered 2014-06-22 (×2): 40 ug via INTRAVENOUS

## 2014-06-22 MED ORDER — BUPIVACAINE LIPOSOME 1.3 % IJ SUSP
INTRAMUSCULAR | Status: DC | PRN
  Administered 2014-06-22: 20 mL

## 2014-06-22 MED ORDER — MENTHOL 3 MG MT LOZG
1.0000 | LOZENGE | OROMUCOSAL | Status: DC | PRN
  Filled 2014-06-22 (×2): qty 9

## 2014-06-22 MED ORDER — GLYCOPYRROLATE 0.2 MG/ML IJ SOLN
INTRAMUSCULAR | Status: AC
  Filled 2014-06-22: qty 4

## 2014-06-22 MED ORDER — BUPIVACAINE LIPOSOME 1.3 % IJ SUSP
20.0000 mL | Freq: Once | INTRAMUSCULAR | Status: DC
  Filled 2014-06-22: qty 20

## 2014-06-22 MED ORDER — NEOSTIGMINE METHYLSULFATE 10 MG/10ML IV SOLN
INTRAVENOUS | Status: AC
  Filled 2014-06-22: qty 1

## 2014-06-22 MED ORDER — DEXTROSE 5 % IV SOLN
10.0000 mg | INTRAVENOUS | Status: DC | PRN
  Administered 2014-06-22: 25 ug/min via INTRAVENOUS

## 2014-06-22 MED ORDER — ARTIFICIAL TEARS OP OINT
TOPICAL_OINTMENT | OPHTHALMIC | Status: DC | PRN
  Administered 2014-06-22: 1 via OPHTHALMIC

## 2014-06-22 MED ORDER — ONDANSETRON HCL 4 MG/2ML IJ SOLN: 4.0000 mg | INTRAMUSCULAR | Status: DC | PRN

## 2014-06-22 MED ORDER — THROMBIN 20000 UNITS EX SOLR
CUTANEOUS | Status: DC | PRN
  Administered 2014-06-22: 10:00:00 via TOPICAL

## 2014-06-22 MED ORDER — SODIUM CHLORIDE 0.9 % IV SOLN
Freq: Once | INTRAVENOUS | Status: DC
Start: 2014-06-22 — End: 2014-06-29

## 2014-06-22 MED ORDER — DIPHENHYDRAMINE HCL 50 MG/ML IJ SOLN: 12.5000 mg | Freq: Four times a day (QID) | INTRAMUSCULAR | Status: DC | PRN

## 2014-06-22 SURGICAL SUPPLY — 83 items
APL SKNCLS STERI-STRIP NONHPOA (GAUZE/BANDAGES/DRESSINGS) ×1
APL SRG 60D 8 XTD TIP BNDBL (TIP) ×1
BENZOIN TINCTURE PRP APPL 2/3 (GAUZE/BANDAGES/DRESSINGS) ×2 IMPLANT
BLADE SURG ROTATE 9660 (MISCELLANEOUS) IMPLANT
BUR ACORN 6.0 (BURR) ×3 IMPLANT
BUR ACORN 6.0 PRECISION (BURR) ×1 IMPLANT
BUR MATCHSTICK NEURO 3.0 LAGG (BURR) ×2 IMPLANT
CANISTER SUCT 3000ML (MISCELLANEOUS) ×2 IMPLANT
CAP LOCKING THREADED (Cap) ×4 IMPLANT
CONT SPEC 4OZ CLIKSEAL STRL BL (MISCELLANEOUS) ×2 IMPLANT
COVER BACK TABLE 24X17X13 BIG (DRAPES) IMPLANT
COVER TABLE BACK 60X90 (DRAPES) ×2 IMPLANT
CROSSLINK SPINAL FUSION (Cage) ×1 IMPLANT
DRAPE C-ARM 42X72 X-RAY (DRAPES) ×4 IMPLANT
DRAPE LAPAROTOMY 100X72X124 (DRAPES) ×2 IMPLANT
DRAPE POUCH INSTRU U-SHP 10X18 (DRAPES) ×2 IMPLANT
DRSG OPSITE POSTOP 4X6 (GAUZE/BANDAGES/DRESSINGS) ×1 IMPLANT
DRSG OPSITE POSTOP 4X8 (GAUZE/BANDAGES/DRESSINGS) ×2 IMPLANT
DURAPREP 26ML APPLICATOR (WOUND CARE) ×2 IMPLANT
DURASEAL APPLICATOR TIP (TIP) ×1 IMPLANT
DURASEAL SPINE SEALANT 3ML (MISCELLANEOUS) ×1 IMPLANT
ELECT REM PT RETURN 9FT ADLT (ELECTROSURGICAL) ×2
ELECTRODE REM PT RTRN 9FT ADLT (ELECTROSURGICAL) ×1 IMPLANT
EVACUATOR 1/8 PVC DRAIN (DRAIN) IMPLANT
EVACUATOR 3/16  PVC DRAIN (DRAIN) ×1
EVACUATOR 3/16 PVC DRAIN (DRAIN) ×1 IMPLANT
GAUZE SPONGE 4X4 12PLY STRL (GAUZE/BANDAGES/DRESSINGS) ×2 IMPLANT
GAUZE SPONGE 4X4 16PLY XRAY LF (GAUZE/BANDAGES/DRESSINGS) ×2 IMPLANT
GLOVE BIOGEL M 8.0 STRL (GLOVE) ×3 IMPLANT
GLOVE BIOGEL PI IND STRL 7.0 (GLOVE) IMPLANT
GLOVE BIOGEL PI IND STRL 7.5 (GLOVE) IMPLANT
GLOVE BIOGEL PI INDICATOR 7.0 (GLOVE) ×2
GLOVE BIOGEL PI INDICATOR 7.5 (GLOVE) ×1
GLOVE ECLIPSE 7.5 STRL STRAW (GLOVE) ×6 IMPLANT
GLOVE EXAM NITRILE LRG STRL (GLOVE) IMPLANT
GLOVE EXAM NITRILE MD LF STRL (GLOVE) IMPLANT
GLOVE EXAM NITRILE XL STR (GLOVE) IMPLANT
GLOVE EXAM NITRILE XS STR PU (GLOVE) IMPLANT
GLOVE SS BIOGEL STRL SZ 7 (GLOVE) IMPLANT
GLOVE SUPERSENSE BIOGEL SZ 7 (GLOVE) ×1
GLOVE SURG SS PI 7.0 STRL IVOR (GLOVE) ×2 IMPLANT
GOWN STRL REUS W/ TWL LRG LVL3 (GOWN DISPOSABLE) ×3 IMPLANT
GOWN STRL REUS W/ TWL XL LVL3 (GOWN DISPOSABLE) IMPLANT
GOWN STRL REUS W/TWL 2XL LVL3 (GOWN DISPOSABLE) IMPLANT
GOWN STRL REUS W/TWL LRG LVL3 (GOWN DISPOSABLE) ×6
GOWN STRL REUS W/TWL XL LVL3 (GOWN DISPOSABLE) ×8
GRAFT DURAGEN MATRIX 1WX1L (Tissue) ×1 IMPLANT
KIT BASIN OR (CUSTOM PROCEDURE TRAY) ×2 IMPLANT
KIT INFUSE LRG II (Orthopedic Implant) ×1 IMPLANT
KIT ROOM TURNOVER OR (KITS) ×2 IMPLANT
MILL MEDIUM DISP (BLADE) ×1 IMPLANT
NDL HYPO 18GX1.5 BLUNT FILL (NEEDLE) IMPLANT
NDL HYPO 21X1.5 SAFETY (NEEDLE) IMPLANT
NEEDLE HYPO 18GX1.5 BLUNT FILL (NEEDLE) IMPLANT
NEEDLE HYPO 21X1.5 SAFETY (NEEDLE) IMPLANT
NEEDLE HYPO 25X1 1.5 SAFETY (NEEDLE) IMPLANT
NS IRRIG 1000ML POUR BTL (IV SOLUTION) ×2 IMPLANT
PACK LAMINECTOMY NEURO (CUSTOM PROCEDURE TRAY) ×2 IMPLANT
PAD ABD 8X10 STRL (GAUZE/BANDAGES/DRESSINGS) IMPLANT
PAD ARMBOARD 7.5X6 YLW CONV (MISCELLANEOUS) ×6 IMPLANT
PATTIES SURGICAL .5 X1 (DISPOSABLE) ×2 IMPLANT
PATTIES SURGICAL .5 X3 (DISPOSABLE) IMPLANT
ROD CREO 50MM (Rod) ×2 IMPLANT
SCREW SPINE CREO 6.5X50 (Screw) ×4 IMPLANT
SPACER RISE 8X22 11-17MM-15 (Spacer) ×2 IMPLANT
SPONGE LAP 4X18 X RAY DECT (DISPOSABLE) IMPLANT
SPONGE NEURO XRAY DETECT 1X3 (DISPOSABLE) IMPLANT
SPONGE SURGIFOAM ABS GEL 100 (HEMOSTASIS) ×2 IMPLANT
STRIP CLOSURE SKIN 1/2X4 (GAUZE/BANDAGES/DRESSINGS) ×2 IMPLANT
SUT ETHILON 2 0 FS 18 (SUTURE) ×1 IMPLANT
SUT PROLENE 6 0 BV (SUTURE) ×2 IMPLANT
SUT VIC AB 1 CT1 18XBRD ANBCTR (SUTURE) ×2 IMPLANT
SUT VIC AB 1 CT1 8-18 (SUTURE) ×4
SUT VIC AB 2-0 CP2 18 (SUTURE) ×2 IMPLANT
SUT VIC AB 3-0 SH 8-18 (SUTURE) ×2 IMPLANT
SYR 20CC LL (SYRINGE) IMPLANT
SYR 20ML ECCENTRIC (SYRINGE) ×2 IMPLANT
SYR 5ML LL (SYRINGE) IMPLANT
TAPE STRIPS DRAPE STRL (GAUZE/BANDAGES/DRESSINGS) ×1 IMPLANT
TOWEL OR 17X24 6PK STRL BLUE (TOWEL DISPOSABLE) ×2 IMPLANT
TOWEL OR 17X26 10 PK STRL BLUE (TOWEL DISPOSABLE) ×2 IMPLANT
TRAY FOLEY CATH 14FRSI W/METER (CATHETERS) ×2 IMPLANT
WATER STERILE IRR 1000ML POUR (IV SOLUTION) ×2 IMPLANT

## 2014-06-22 NOTE — Progress Notes (Signed)
UR completed.  Takako Minckler, RN BSN MHA CCM Trauma/Neuro ICU Case Manager [phone]  

## 2014-06-22 NOTE — Transfer of Care (Signed)
Immediate Anesthesia Transfer of Care Note  Patient: Adam Bennett  Procedure(s) Performed: Procedure(s) with comments: L3 Laminectomy with L4-5 Diskectomy/cages/posterolateral arthrodesis/pedicle screws (N/A) - L3 Laminectomy with L4-5 Diskectomy/cages/posterolateral arthrodesis/pedicle screws  Patient Location: PACU  Anesthesia Type:General  Level of Consciousness: awake, alert  and oriented  Airway & Oxygen Therapy: Patient Spontanous Breathing and Patient connected to nasal cannula oxygen  Post-op Assessment: Report given to PACU RN, Post -op Vital signs reviewed and stable and Patient moving all extremities X 4  Post vital signs: Reviewed and stable  Complications: No apparent anesthesia complications

## 2014-06-22 NOTE — Anesthesia Postprocedure Evaluation (Signed)
  Anesthesia Post-op Note  Patient: Adam Bennett  Procedure(s) Performed: Procedure(s) with comments: L3 Laminectomy with L4-5 Diskectomy/cages/posterolateral arthrodesis/pedicle screws (N/A) - L3 Laminectomy with L4-5 Diskectomy/cages/posterolateral arthrodesis/pedicle screws  Patient Location: PACU  Anesthesia Type:General  Level of Consciousness: alert  and oriented  Airway and Oxygen Therapy: Patient Spontanous Breathing and Patient connected to nasal cannula oxygen  Post-op Pain: mild  Post-op Assessment: Post-op Vital signs reviewed, Patient's Cardiovascular Status Stable and Respiratory Function Stable  Post-op Vital Signs: Reviewed and stable  Last Vitals:  Filed Vitals:   06/22/14 1340  BP: 108/60  Pulse: 76  Temp:   Resp: 18    Complications: No apparent anesthesia complications

## 2014-06-22 NOTE — Anesthesia Procedure Notes (Signed)
Procedure Name: Intubation Date/Time: 06/22/2014 9:06 AM Performed by: Gaylene Brooks Pre-anesthesia Checklist: Timeout performed, Patient identified, Emergency Drugs available, Suction available and Patient being monitored Patient Re-evaluated:Patient Re-evaluated prior to inductionOxygen Delivery Method: Circle system utilized Preoxygenation: Pre-oxygenation with 100% oxygen Intubation Type: IV induction Ventilation: Mask ventilation without difficulty Laryngoscope Size: Miller and 2 Grade View: Grade II Tube type: Oral Tube size: 7.5 mm Number of attempts: 1 Airway Equipment and Method: Stylet Placement Confirmation: ETT inserted through vocal cords under direct vision,  breath sounds checked- equal and bilateral,  positive ETCO2 and CO2 detector Secured at: 23 cm Tube secured with: Tape Dental Injury: Teeth and Oropharynx as per pre-operative assessment

## 2014-06-22 NOTE — Progress Notes (Signed)
Patient is an admission from PACU. Patient is alert and oriented x4. Patient is oriented to the room. Family at bedside.

## 2014-06-23 MED ORDER — HYDROMORPHONE 0.3 MG/ML IV SOLN
INTRAVENOUS | Status: DC
  Administered 2014-06-23: 01:00:00 via INTRAVENOUS
  Administered 2014-06-23: 1.5 mg via INTRAVENOUS
  Administered 2014-06-24 (×2): 0.6 mg via INTRAVENOUS
  Filled 2014-06-23 (×2): qty 25

## 2014-06-23 MED ORDER — FAMOTIDINE 20 MG PO TABS
20.0000 mg | ORAL_TABLET | Freq: Two times a day (BID) | ORAL | Status: DC
  Administered 2014-06-23 – 2014-06-29 (×12): 20 mg via ORAL
  Filled 2014-06-23 (×5): qty 1
  Filled 2014-06-23: qty 2
  Filled 2014-06-23 (×6): qty 1

## 2014-06-23 MED ORDER — DIPHENHYDRAMINE HCL 12.5 MG/5ML PO ELIX
12.5000 mg | ORAL_SOLUTION | Freq: Four times a day (QID) | ORAL | Status: DC | PRN
  Administered 2014-06-23 (×2): 12.5 mg via ORAL
  Filled 2014-06-23 (×2): qty 10

## 2014-06-23 MED ORDER — DOCUSATE SODIUM 100 MG PO CAPS
100.0000 mg | ORAL_CAPSULE | Freq: Two times a day (BID) | ORAL | Status: DC
  Administered 2014-06-23 – 2014-06-29 (×13): 100 mg via ORAL
  Filled 2014-06-23 (×13): qty 1

## 2014-06-23 MED ORDER — DIPHENHYDRAMINE HCL 50 MG/ML IJ SOLN
12.5000 mg | Freq: Four times a day (QID) | INTRAMUSCULAR | Status: DC | PRN
Start: 2014-06-23 — End: 2014-06-24

## 2014-06-23 MED ORDER — ONDANSETRON HCL 4 MG/2ML IJ SOLN: 4.0000 mg | Freq: Four times a day (QID) | INTRAMUSCULAR | Status: DC | PRN

## 2014-06-23 MED ORDER — NALOXONE HCL 0.4 MG/ML IJ SOLN: 0.4000 mg | INTRAMUSCULAR | Status: DC | PRN

## 2014-06-23 MED ORDER — SODIUM CHLORIDE 0.9 % IJ SOLN: 9.0000 mL | INTRAMUSCULAR | Status: DC | PRN

## 2014-06-23 NOTE — Op Note (Signed)
Adam Bennett, Adam Bennett                   ACCOUNT NO.:  [id number]  MEDICAL RECORD NO.:  [id number]  LOCATION:  [id number]                        FACILITY:  [facility]  PHYSICIAN:  Leeroy Cha, M.D.   DATE OF BIRTH:  1940/12/07  DATE OF PROCEDURE:  06/22/2014 DATE OF DISCHARGE:                              OPERATIVE REPORT   PREOPERATIVE DIAGNOSIS:  Lumbar stenosis at L3-L4, L4-L5 with grade 1 and grade 2 spondylolisthesis at L4-L5.  Chronic radiculopathy lumbar.  POSTOPERATIVE DIAGNOSIS:  Lumbar stenosis at L3-L4, L4-L5 with grade 1 and grade 2 spondylolisthesis at L4-L5.  Chronic radiculopathy lumbar.  PROCEDURE:  Bilateral L3-L4 laminectomy and foraminotomy bilaterally, L4- L5 diskectomy more than normal to introduce 2 cages.  Decompression of the thecal sac.  Lysis of adhesions.  Pedicle screws at L4-L5. Posterolateral arthrodesis from L3-L5 using BMP and autograft. Microscope.  Cell Saver.  C-arm.  SURGEON:  Leeroy Cha, M.D.  ASSISTANT:  Dr. Kathyrn Bennett.  CLINICAL HISTORY:  Adam Bennett is a gentleman who was seen in my office complaining of back pain with radiation to both legs.  I have been following this gentleman for many years, but now he is getting worse. Any type of walking activity bring the pain down to both lower extremities.  X-ray showed severe stenosis at the level of 3-4, and 4-5 with spondylolisthesis at 4-5.  Surgery was advised.  He and his wife knew the risk of the surgery including infection, CSF leak, paralysis, no improvement whatsoever, need for further surgery.  Also, he had been taking anti-inflammatory for a long period of time, so he is fully aware of the possibility of hematoma.  PROCEDURE:  The patient was taken to the OR, and after intubation, he was positioned in a prone manner.  The back was cleaned with DuraPrep and drapes were applied.  Midline incision from L2 to L4-5 was made and muscle were retracted all the way laterally until we were able to  feel and see the facet of 3-4 and 4-5.  Immediately, we found that the patient has a diffuse oozing most likely secondary to the anti- inflammatory.  X-ray showed that we were at the level of L4-5.  We proceeded to remove the spinous process of L3-L4 as well as the lamina. The area was quite narrow.  The patient had quite a bit of stenosis with thickening of the yellow ligament.  Removal was done and at the end we had plenty of space for the thecal sac.  Foraminotomy was accomplished. Then, we removed the facet of L4 bilaterally.  This was done using a drill.  Lysis was accomplished.  We retracted thecal sac in the left side and we entered the disk space with total gross diskectomy accomplished.  The diskectomy went medially and laterally.  The same procedure was done on the right side.  The patient had quite a bit of calcification with adhesion, and there was a small opening in the dura, which was taken care with 3 stitches of 6-0 Nurolon with the help of the microscope.  We proceeded with total diskectomy medially and way laterally.  We introduced 2 cages expandable, which were brought all the way  up to 17 mm.  The cage had BMP inside.  So the rest of the disk space was filled with BMP and autograft.  We had good decompression of the thecal sac at the L3, L4, and L5 nerve root.  Valsalva 3 times up to 40 was negative.  Then, using the C-arm first in AP view and then a lateral view, we made a hole in the pedicle of L4-L5.  Prior to introduction of the screws, we feel all 4 quadrants just to be sure that we were surrounded by bone.  We introduced 4 screws of 6.5 x 50.  They were kept in place with a rod and caps.  A cross-link from right to left was used.  Then, we went laterally and we removed the periosteum of 3-4 and 4-5, lateral facet, as well as the takeoff of the transverse process.  A mix of BMP and autograft was used for arthrodesis.  A drain was left.  The area was irrigated.   Vancomycin powder was left in the muscle area, and the wound was closed with several layers of Vicryl and nylon.  The patient did well, he woke up, moving all 4 extremities.          ______________________________ Leeroy Cha, M.D.     EB/MEDQ  D:  06/22/2014  T:  06/23/2014  Job:  [id number]

## 2014-06-23 NOTE — Progress Notes (Signed)
Patient ID: Adam Bennett, male   DOB: 06-Jun-1941, 73 y.o.   MRN: [id number] Subjective: Patient reports no headache, appropriate back pain, no leg pain or numbness tingling or weakness.  Objective: Vital signs in last 24 hours: Temp:  [98 F (36.7 C)-100.4 F (38 C)] 98.5 F (36.9 C) (08/29 0630) Pulse Rate:  [66-111] 96 (08/29 0630) Resp:  [12-22] 16 (08/29 0800) BP: (108-147)/(57-96) 145/88 mmHg (08/29 0630) SpO2:  [94 %-100 %] 97 % (08/29 0800) Weight:  [95.709 kg (211 lb)] 95.709 kg (211 lb) (08/28 1637)  Intake/Output from previous day: 08/28 0701 - 08/29 0700 In: 3150 [I.V.:2200; Blood:450; IV Piggyback:500] Out: 8250 [Urine:3845; Drains:400; Blood:1000] Intake/Output this shift:    Neurologic: Grossly normal  Lab Results: Lab Results  Component Value Date   WBC 8.8 06/20/2014   HGB 15.5 06/20/2014   HCT 44.9 06/20/2014   MCV 87.5 06/20/2014   PLT 221 06/20/2014   Lab Results  Component Value Date   INR 0.93 07/02/2010   BMET Lab Results  Component Value Date   NA 141 06/20/2014   K 4.8 06/20/2014   CL 102 06/20/2014   CO2 26 06/20/2014   GLUCOSE 109* 06/20/2014   BUN 14 06/20/2014   CREATININE 0.89 06/20/2014   CALCIUM 9.4 06/20/2014    Studies/Results: Dg Lumbar Spine 2-3 Views  06/22/2014   CLINICAL DATA:  Post fusion  EXAM: LUMBAR SPINE - 2-3 VIEW; DG C-ARM 61-120 MIN  COMPARISON:  None  FINDINGS: Two views of the lumbar spine submitted. Pedicular screws are noted L4 and L5 vertebral body. Postsurgical intervertebral discs material noted L4-L5 level. There is disc space flattening with mild anterior spurring at L5-S1 level.  IMPRESSION: Postsurgical changes at L4-L5 level.  The alignment is preserved.  Fluoroscopy time was 34 seconds.   Electronically Signed   By: Lahoma Crocker M.D.   On: 06/22/2014 13:09   Dg Lumbar Spine 1 View  06/22/2014   CLINICAL DATA:  L4-5 fusion.  EXAM: LUMBAR SPINE - 1 VIEW  COMPARISON:  MRI scan of June 13, 2014.  FINDINGS: Single lateral  intraoperative view of the lumbar spine demonstrates surgical retractors in the soft tissues posterior to L3, L4 and L5. One surgical probe is seen directed toward the posterior portion of the L4 vertebral body, while another is directed toward the posterior portion of the L5 vertebral body. Degenerative disc disease is noted at L4-5 and L5-S1.  IMPRESSION: Surgical localization as described above.   Electronically Signed   By: Sabino Dick M.D.   On: 06/22/2014 13:00   Dg C-arm 1-60 Min  06/22/2014   CLINICAL DATA:  Post fusion  EXAM: LUMBAR SPINE - 2-3 VIEW; DG C-ARM 61-120 MIN  COMPARISON:  None  FINDINGS: Two views of the lumbar spine submitted. Pedicular screws are noted L4 and L5 vertebral body. Postsurgical intervertebral discs material noted L4-L5 level. There is disc space flattening with mild anterior spurring at L5-S1 level.  IMPRESSION: Postsurgical changes at L4-L5 level.  The alignment is preserved.  Fluoroscopy time was 34 seconds.   Electronically Signed   By: Lahoma Crocker M.D.   On: 06/22/2014 13:09    Assessment/Plan: Continue flat bedrest for now. Continue pain control   LOS: 1 day    Suren Payne S 06/23/2014, 8:47 AM

## 2014-06-24 MED ORDER — OXYCODONE-ACETAMINOPHEN 5-325 MG PO TABS
2.0000 | ORAL_TABLET | ORAL | Status: DC | PRN
  Administered 2014-06-24 – 2014-06-25 (×4): 2 via ORAL
  Administered 2014-06-26 – 2014-06-27 (×2): 1 via ORAL
  Filled 2014-06-24 (×7): qty 2

## 2014-06-24 MED ORDER — HYDROMORPHONE HCL PF 1 MG/ML IJ SOLN: 0.5000 mg | INTRAMUSCULAR | Status: DC | PRN

## 2014-06-24 NOTE — Progress Notes (Signed)
Wasted 18 ml of fentanyl PCA syringe with Richie Washington and Emergency planning/management officer.

## 2014-06-24 NOTE — Progress Notes (Signed)
Wasted 7.5 ml of Fentanyl PCA with [facility]

## 2014-06-24 NOTE — Progress Notes (Signed)
No issues overnight. Pt has appropriate back pain.   EXAM:  BP 118/75  Pulse 105  Temp(Src) 98.5 F (36.9 C) (Oral)  Resp 20  Ht 6\' 3"  (1.905 m)  Wt 95.709 kg (211 lb)  BMI 26.37 kg/m2  SpO2 96%  Awake, alert, oriented  Speech fluent, appropriate  Dressing has some serosanguinous shadowing, no obvious CSF leak Hemovac in place  IMPRESSION:  73 y.o. male POD#2 s/p L4-5 PLIF, neurologically well - Fever likely related to atelectasis   PLAN: - Remain flat in bed through weekend - Keep Hemovac in place - Encouraged IS use

## 2014-06-25 ENCOUNTER — Inpatient Hospital Stay (HOSPITAL_COMMUNITY): Payer: Medicare Other

## 2014-06-25 ENCOUNTER — Encounter (HOSPITAL_COMMUNITY): Payer: Self-pay | Admitting: Internal Medicine

## 2014-06-25 DIAGNOSIS — I10 Essential (primary) hypertension: Secondary | ICD-10-CM | POA: Diagnosis present

## 2014-06-25 DIAGNOSIS — R55 Syncope and collapse: Secondary | ICD-10-CM | POA: Diagnosis not present

## 2014-06-25 DIAGNOSIS — I4891 Unspecified atrial fibrillation: Secondary | ICD-10-CM | POA: Diagnosis not present

## 2014-06-25 DIAGNOSIS — I959 Hypotension, unspecified: Secondary | ICD-10-CM | POA: Diagnosis not present

## 2014-06-25 DIAGNOSIS — K219 Gastro-esophageal reflux disease without esophagitis: Secondary | ICD-10-CM | POA: Diagnosis present

## 2014-06-25 HISTORY — DX: Syncope and collapse: R55

## 2014-06-25 HISTORY — DX: Essential (primary) hypertension: I10

## 2014-06-25 HISTORY — DX: Hypotension, unspecified: I95.9

## 2014-06-25 HISTORY — DX: Unspecified atrial fibrillation: I48.91

## 2014-06-25 LAB — BASIC METABOLIC PANEL
Anion gap: 9 (ref 5–15)
BUN: 15 mg/dL (ref 6–23)
CHLORIDE: 100 meq/L (ref 96–112)
CO2: 27 meq/L (ref 19–32)
CREATININE: 1.09 mg/dL (ref 0.50–1.35)
Calcium: 8.5 mg/dL (ref 8.4–10.5)
GFR calc non Af Amer: 66 mL/min — ABNORMAL LOW (ref >90)
GFR, EST AFRICAN AMERICAN: 77 mL/min — AB (ref >90)
Glucose, Bld: 156 mg/dL — ABNORMAL HIGH (ref 70–99)
POTASSIUM: 4.8 meq/L (ref 3.7–5.3)
SODIUM: 136 meq/L — AB (ref 137–147)

## 2014-06-25 LAB — TYPE AND SCREEN
ABO/RH(D): A POS
Antibody Screen: NEGATIVE
UNIT DIVISION: 0

## 2014-06-25 LAB — CBC
HCT: 34.6 % — ABNORMAL LOW (ref 39.0–52.0)
Hemoglobin: 11.7 g/dL — ABNORMAL LOW (ref 13.0–17.0)
MCH: 29.6 pg (ref 26.0–34.0)
MCHC: 33.8 g/dL (ref 30.0–36.0)
MCV: 87.6 fL (ref 78.0–100.0)
PLATELETS: 179 10*3/uL (ref 150–400)
RBC: 3.95 MIL/uL — ABNORMAL LOW (ref 4.22–5.81)
RDW: 13.5 % (ref 11.5–15.5)
WBC: 14.5 10*3/uL — AB (ref 4.0–10.5)

## 2014-06-25 LAB — MAGNESIUM: Magnesium: 1.8 mg/dL (ref 1.5–2.5)

## 2014-06-25 LAB — TSH: TSH: 0.781 u[IU]/mL (ref 0.350–4.500)

## 2014-06-25 MED ORDER — METOPROLOL TARTRATE 1 MG/ML IV SOLN
2.5000 mg | Freq: Once | INTRAVENOUS | Status: AC
  Administered 2014-06-25: 2.5 mg via INTRAVENOUS
  Filled 2014-06-25: qty 5

## 2014-06-25 MED ORDER — IOHEXOL 350 MG/ML SOLN
80.0000 mL | Freq: Once | INTRAVENOUS | Status: AC | PRN
  Administered 2014-06-25: 80 mL via INTRAVENOUS

## 2014-06-25 MED ORDER — BISACODYL 10 MG RE SUPP: 10.0000 mg | Freq: Every day | RECTAL | Status: DC | PRN

## 2014-06-25 MED ORDER — SODIUM CHLORIDE 0.9 % IV SOLN
INTRAVENOUS | Status: AC
  Administered 2014-06-25: 16:00:00 via INTRAVENOUS

## 2014-06-25 MED FILL — Sodium Chloride IV Soln 0.9%: INTRAVENOUS | Qty: 1000 | Status: AC

## 2014-06-25 MED FILL — Heparin Sodium (Porcine) Inj 1000 Unit/ML: INTRAMUSCULAR | Qty: 30 | Status: AC

## 2014-06-25 MED FILL — Sodium Chloride Irrigation Soln 0.9%: Qty: 3000 | Status: AC

## 2014-06-25 NOTE — Consult Note (Signed)
[facility] Medical Consultation  Adam Bennett TDV:[id number] DOB: November 18, 1940 DOA: 06/22/2014 PCP: [name], MD   Requesting physician: Joya Salm Date of consultation: 06/25/14 Reason for consultation: syncope  Impression/Recommendations Near  Syncope secondary to hypotension: better after fluid bolus. Hold lisinopril and norvasc. Continue IVF. CXR ok. Check ua to r/o UTI. hgb ok    Atrial fibrillation, with RVR, new. Given hypotension, recent surgery will get CT angio of chest to r/o PE.  Patient just had echo with [facility] in [place]. Will need that report when the office opens.  Check TSH. As blood pressure is better, will give small dose of metoprolol.    Hypotension, unspecified: see above.     Essential hypertension, benign    GERD (gastroesophageal reflux disease)   [facility] to follow.  Thanks, Dr. Joya Salm, for this consultation.  Chief Complaint: near syncope  HPI:  Adam Bennett is a 73 year old white male with a history of hypertension, status post lumbar laminectomy and discectomy 3 days ago by Dr. Joya Salm. He had been on bedrest for several days. While in a chair today for the first time with physical therapy, he became lightheaded. Blood pressure was checked and noted to be 76/51. He was given a liter of saline and placed back in bed. Blood pressure is currently 103/51. Of note, 6 AM blood pressure this morning was 88/37. He received a fluid bolus at that time. He received 10 mg of amlodipine and 5 mg of lisinopril at 1 PM today. EKG was checked and shows atrial fibrillation with a rate of about 110. Currently he feels very weak. Preoperative EKG showed normal sinus rhythm. Patient reports having had an echocardiogram in [place] with [facility] as part of his preoperative evaluation. He reports also having had carotid Dopplers at [facility]. Both were reportedly unremarkable.  Patient reports that he still feels weak. Denies cough, shortness  of breath, palpitations, chest pain vomiting, diarrhea, fevers or chills. No history of heart disease, DVT or PE. He reports he has a history of "heart murmur". Patient has had sequential compression devices postoperatively  Review of Systems:  Systems reviewed. As above otherwise negative.  Past Medical History  Diagnosis Date  . Hypertension   . GERD (gastroesophageal reflux disease)    Past Surgical History  Procedure Laterality Date  . Joint replacement      BILATER KNEE REPLACEMENTS  . Cardiac catheterization      DONE IN [place], 82YRS AGO  . Cholecystectomy    . Hernia repair    . Foot surgery     Social History:  reports that he has never smoked. He does not have any smokeless tobacco history on file. He reports that he drinks alcohol. He reports that he does not use illicit drugs.  Allergies  Allergen Reactions  . Norco [Hydrocodone-Acetaminophen] Itching  . Oxycodone Itching    CAN TAKE WITH BENADRYL   Family History  Problem Relation Age of Onset  . Congestive Heart Failure Father   . Congestive Heart Failure Brother   . CAD Father   . CAD Brother   . Hypertension    . Diabetes    . Colon cancer Mother     Prior to Admission medications   Medication Sig Start Date End Date Taking? Authorizing Provider  amLODipine (NORVASC) 10 MG tablet Take 10 mg by mouth daily.   Yes Historical Provider, MD  aspirin EC 81 MG tablet Take 81 mg by mouth daily.   Yes Historical Provider,  MD  Chlorpheniramine Maleate (ALLERGY PO) Take 1 tablet by mouth daily as needed (allergies).   Yes Historical Provider, MD  lisinopril (PRINIVIL,ZESTRIL) 5 MG tablet Take 5 mg by mouth daily.   Yes Historical Provider, MD  Multiple Vitamin (MULTIVITAMIN WITH MINERALS) TABS tablet Take 1 tablet by mouth daily.   Yes Historical Provider, MD  naproxen sodium (ANAPROX) 220 MG tablet Take 220 mg by mouth 2 (two) times daily with a meal.   Yes Historical Provider, MD  omeprazole (PRILOSEC) 20 MG  capsule Take 20 mg by mouth 2 (two) times daily before a meal.   Yes Historical Provider, MD  tetrahydrozoline (VISINE) 0.05 % ophthalmic solution Place 1 drop into both eyes daily as needed (dry eyes).   Yes Historical Provider, MD   Physical Exam: Blood pressure 103/51, pulse 107, temperature 97.4 F (36.3 C), temperature source Oral, resp. rate 18, height 6\' 3"  (1.905 m), weight 95.709 kg (211 lb), SpO2 98.00%. Filed Vitals:   06/25/14 1621  BP: 103/51  Pulse: 107  Temp:   Resp:     BP 103/51  Pulse 107  Temp(Src) 97.4 F (36.3 C) (Oral)  Resp 18  Ht 6\' 3"  (1.905 m)  Wt 95.709 kg (211 lb)  BMI 26.37 kg/m2  SpO2 98%  Telemetry: Atrial fibrillation with rate of 130  General Appearance:    Alert, slightly pale, anxious appearing.  Breathing nonlabored.oriented.   Head:    Normocephalic, without obvious abnormality, atraumatic  Eyes:    PERRL, conjunctiva/corneas clear, EOM's intact       Nose:   Nares normal, septum midline, mucosa normal, no drainage   or sinus tenderness  Throat:   Lips, mucosa, and tongue normal; teeth and gums normal  Neck:   Supple, symmetrical, trachea midline, no adenopathy;       thyroid:  No enlargement/tenderness/nodules; no carotid   bruit or JVD  Back:     dressing clean dry and intact.   Lungs:     Clear to auscultation bilaterally, respirations unlabored  Chest wall:    No tenderness or deformity  Heart:    irregularly irregular without murmurs gallops rubs   Abdomen:     Soft, non-tender, bowel sounds active all four quadrants,    no masses, no organomegaly  Genitalia:    deferred   Rectal:    for   Extremities:   Extremities normal, atraumatic, no cyanosis or edema  Pulses:   2+ and symmetric all extremities  Skin:   Skin color, texture, turgor normal, no rashes or lesions  Lymph nodes:   Cervical, supraclavicular, and axillary nodes normal  Neurologic:   CNII-XII intact. no focal deficits noted.     psychiatric: Anxious. Cooperative.    Labs on Admission:  Basic Metabolic Panel:  Recent Labs Lab 06/20/14 1357 06/25/14 1547  NA 141 136*  K 4.8 4.8  CL 102 100  CO2 26 27  GLUCOSE 109* 156*  BUN 14 15  CREATININE 0.89 1.09  CALCIUM 9.4 8.5   Liver Function Tests: No results found for this basename: AST, ALT, ALKPHOS, BILITOT, PROT, ALBUMIN,  in the last 168 hours No results found for this basename: LIPASE, AMYLASE,  in the last 168 hours No results found for this basename: AMMONIA,  in the last 168 hours CBC:  Recent Labs Lab 06/20/14 1357 06/25/14 1547  WBC 8.8 14.5*  HGB 15.5 11.7*  HCT 44.9 34.6*  MCV 87.5 87.6  PLT 221 179   Cardiac Enzymes:  No results found for this basename: CKTOTAL, CKMB, CKMBINDEX, TROPONINI,  in the last 168 hours BNP: No components found with this basename: POCBNP,  CBG: No results found for this basename: GLUCAP,  in the last 168 hours  Radiological Exams on Admission: Dg Chest Port 1 View  06/25/2014   CLINICAL DATA:  Syncope and shortness of breath  EXAM: PORTABLE CHEST - 1 VIEW  COMPARISON:  PA and lateral chest of June 20, 2014  FINDINGS: The lungs are adequately inflated. There is no focal infiltrate. The heart and pulmonary vascularity are normal. There is no pleural effusion. There is degenerative disc change of the midcervical spine.  IMPRESSION: There is no acute cardiopulmonary abnormality.   Electronically Signed   By: David  Martinique   On: 06/25/2014 15:41    EKG: Atrial fibrillation with rapid ventricular response Left axis deviation Minimal voltage criteria for LVH, may be normal variant Nonspecific ST abnormality  Time spent: 90 minutes  Delfina Redwood, MD [facility] Pager [phone]  If 7PM-7AM, please contact night-coverage [email or web address] Password [email or web address] 06/25/2014, 5:59 PM

## 2014-06-25 NOTE — Evaluation (Addendum)
Physical Therapy Evaluation Patient Details Name: Adam Bennett MRN: [id number] DOB: 1941-02-16 Today's Date: 06/25/2014   History of Present Illness  73 y.o. s/p L3 Laminectomy with L4-5 Diskectomy/cages/posterolateral arthrodesis/pedicle screws (N/A) - L3 Laminectomy with L4-5 Diskectomy/cages/posterolateral arthrodesis/pedicle screws  Clinical Impression  Patient is s/p above surgery resulting in the deficits listed below (see PT Problem List).  Patient will benefit from skilled PT to increase their independence and safety with mobility (while adhering to their precautions) to allow discharge home with wife. Pt limited in evaluation due to recent syncopal episode with nursing. Pt had became orthostatic (see in Vitals tab); with LEs elevated pt became more alert and BP recovered; nursing requesting (A) transferring pt back to bed. Anticipate pt will progress well with therapy once orthostatics are resolved. Pt very motivated and requesting LE exercises for while in bed tonight.      Follow Up Recommendations Home health PT;Supervision/Assistance - 24 hour;Other (comment) (may progress and not require HHPT )    Equipment Recommendations  None recommended by PT    Recommendations for Other Services       Precautions / Restrictions Precautions Precautions: Back;Fall Precaution Booklet Issued: Yes (comment) Precaution Comments: pt given handout and discussed with wife at end of session; pt too fatigued to review at end of session Required Braces or Orthoses: Spinal Brace Spinal Brace: Lumbar corset;Applied in sitting position Restrictions Weight Bearing Restrictions: No      Mobility  Bed Mobility Overal bed mobility: Needs Assistance Bed Mobility: Rolling;Sit to Sidelying Rolling: Min assist Sidelying to sit: Max assist     Sit to sidelying: Mod assist General bed mobility comments: cues for log rolling technique and at this time pt was more alert and cooperative; (A) to bring LEs  into sidelying position due to generalized weakness and pain   Transfers Overall transfer level: Needs assistance Equipment used: Rolling walker (2 wheeled) Transfers: Sit to/from [facility] Sit to Stand: Min assist;+2 safety/equipment Stand pivot transfers: +2 safety/equipment;Min assist       General transfer comment: 2 person (A) for safety due to recent syncopal episode; pt was alert and communicating; pt also (A) with transfers; limited to SPT only due to syncopal episode ; max cues for safety and sequencing; use of RW to balance   Ambulation/Gait             General Gait Details: pivotal steps only due to weakness and recent syncopal episode   Stairs            Wheelchair Mobility    Modified Rankin (Stroke Patients Only)       Balance Overall balance assessment: Needs assistance Sitting-balance support: Feet supported;No upper extremity supported Sitting balance-Leahy Scale: Fair     Standing balance support: During functional activity;Bilateral upper extremity supported Standing balance-Leahy Scale: Poor Standing balance comment: relies on RW for balance and (A) to maintain upright position                              Pertinent Vitals/Pain Pain Assessment: 0-10 Pain Score: 4  Pain Location: at surgical site  Pain Descriptors / Indicators: Operative site guarding;Cramping;Sharp Pain Intervention(s): Monitored during session;Limited activity within patient's tolerance;Repositioned;Premedicated before session    [place] expects to be discharged to:: Private residence Living Arrangements: Spouse/significant other Available Help at Discharge: Family;Available 24 hours/day Type of Home: House Home Access: Stairs to enter Entrance Stairs-Rails: None Entrance Stairs-Number of  Steps: 1 Home Layout: One level Home Equipment: [place] - 2 wheels;Bedside commode;Shower seat      Prior Function Level of  Independence: Independent               Hand Dominance   Dominant Hand: Right    Extremity/Trunk Assessment   Upper Extremity Assessment: Defer to OT evaluation           Lower Extremity Assessment: Generalized weakness      Cervical / Trunk Assessment: Normal  Communication   Communication: No difficulties  Cognition Arousal/Alertness: Lethargic;Suspect due to medications Behavior During Therapy: [facility] for tasks assessed/performed Overall Cognitive Status: Within Functional Limits for tasks assessed Area of Impairment: Following commands;Orientation Orientation Level: Disoriented to;Place     Following Commands: Follows one step commands with increased time       General Comments: Pt had recently had syncope episode and nursing requesting (A) from PT for mobility; initially pt disoriented; with LEs elevated pt become more alert and oriented    General Comments General comments (skin integrity, edema, etc.): watch orthostatics; pt with recent syncopal episode; educated wife on "BAT" and handout  for back precautions     Exercises General Exercises - Lower Extremity Ankle Circles/Pumps: AROM;Both;15 reps;Supine Heel Slides: AROM;Strengthening;Both;5 reps;Supine      Assessment/Plan    PT Assessment Patient needs continued PT services  PT Diagnosis Difficulty walking;Generalized weakness;Acute pain   PT Problem List Decreased strength;Decreased balance;Decreased activity tolerance;Decreased mobility;Decreased knowledge of precautions;Decreased safety awareness;Decreased knowledge of use of DME;Pain  PT Treatment Interventions DME instruction;Gait training;Stair training;Functional mobility training;Therapeutic activities;Therapeutic exercise;Balance training;Neuromuscular re-education;Patient/family education   PT Goals (Current goals can be found in the Care Plan section) Acute Rehab PT Goals Patient Stated Goal: to get stronger and come home  PT Goal  Formulation: With patient/family Time For Goal Achievement: 06/30/14 Potential to Achieve Goals: Good    Frequency Min 5X/week   Barriers to discharge        Co-evaluation               End of Session Equipment Utilized During Treatment: Gait belt;Back brace;Oxygen Activity Tolerance: Other (comment) (secondary to lightheadedness) Patient left: in bed;with call bell/phone within reach;with bed alarm set;with family/visitor present Nurse Communication: Mobility status;Precautions         Time: 4536-4680 PT Time Calculation (min): 23 min   Charges:   PT Evaluation $Initial PT Evaluation Tier I: 1 Procedure PT Treatments $Therapeutic Activity: 8-22 mins   PT G CodesGustavus Bryant, [place]  [phone] 06/25/2014, 4:57 PM

## 2014-06-25 NOTE — Evaluation (Signed)
Occupational Therapy Evaluation Patient Details Name: Adam Bennett MRN: [id number] DOB: 09/10/1941 Today's Date: 06/25/2014    History of Present Illness 73 y.o. s/p L3 Laminectomy with L4-5 Diskectomy/cages/posterolateral arthrodesis/pedicle screws (N/A) - L3 Laminectomy with L4-5 Diskectomy/cages/posterolateral arthrodesis/pedicle screws   Clinical Impression   Pt s/p above. Pt independent with ADLs, PTA. Feel pt will benefit from acute OT to increase independence prior to d/c.     Follow Up Recommendations  No OT follow up;Supervision/Assistance - 24 hour    Equipment Recommendations  Other (comment) (AE)    Recommendations for Other Services       Precautions / Restrictions Precautions Precautions: Back;Fall Precaution Booklet Issued: No Precaution Comments: Educated on precautions Required Braces or Orthoses: Spinal Brace Spinal Brace: Lumbar corset;Applied in sitting position Restrictions Weight Bearing Restrictions: No      Mobility Bed Mobility Overal bed mobility: Needs Assistance Bed Mobility: Rolling;Sidelying to Sit Rolling: Mod assist Sidelying to sit: Max assist       General bed mobility comments: Heavy assist to lift trunk to sit EOB.  Transfers Overall transfer level: Needs assistance Equipment used: Rolling walker (2 wheeled) Transfers: Sit to/from [facility] Sit to Stand: +2 physical assistance;Mod assist Stand pivot transfers: +2 physical assistance;Min assist       General transfer comment: Assist from elevated bed. OT able to assist pt with sit to stand from chair with Max A. Cues for technique.     Balance   Mod A for balance sitting EOB donning brace.                                         ADL Overall ADL's : Needs assistance/impaired     Grooming: Set up;Sitting           Upper Body Dressing : Minimal assistance; sitting (Pt did require Mod A for balance and assistance with back brace  sitting EOB without back support)    Lower Body Dressing: Maximal assistance;Sit to/from stand;With adaptive equipment   Toilet Transfer: +2 for physical assistance;Minimal assistance;Stand-pivot;RW (chair)           Functional mobility during ADLs: +2 for physical assistance;Minimal assistance;Rolling walker (stand pivot) General ADL Comments: Educated on AE for LB ADLs and pt practiced with reacher and sockaid. Educated on back brace wear as well as use of cup for teeth care and placement of grooming items to avoid breaking precautions.      Vision                     Perception     Praxis      Pertinent Vitals/Pain Pain Assessment: No/denies pain     Hand Dominance Right   Extremity/Trunk Assessment Upper Extremity Assessment Upper Extremity Assessment: Overall WFL for tasks assessed   Lower Extremity Assessment Lower Extremity Assessment: Defer to PT evaluation       Communication Communication Communication: No difficulties   Cognition Arousal/Alertness: Awake/alert Behavior During Therapy: WFL for tasks assessed/performed Overall Cognitive Status: Within Functional Limits for tasks assessed                     General Comments       Exercises       Shoulder Instructions      Home Living Family/patient expects to be discharged to:: Private residence Living Arrangements: Spouse/significant other Available Help at  Discharge: Family;Available 24 hours/day Type of Home: House Home Access: Stairs to enter [employer] of Steps: 1 Entrance Stairs-Rails: None Home Layout: One level     Bathroom Shower/Tub: Tub/shower unit;Walk-in shower   Bathroom Toilet: Handicapped height (sink close)     Home Equipment: Walker - 2 wheels;Bedside commode;Shower seat          Prior Functioning/Environment Level of Independence: Independent             OT Diagnosis: Acute pain;Generalized weakness   OT Problem List: Decreased  strength;Decreased range of motion;Decreased activity tolerance;Impaired balance (sitting and/or standing);Decreased knowledge of use of DME or AE;Decreased knowledge of precautions;Pain   OT Treatment/Interventions: Self-care/ADL training;DME and/or AE instruction;Therapeutic activities;Patient/family education;Balance training    OT Goals(Current goals can be found in the care plan section) Acute Rehab OT Goals Patient Stated Goal: not stated OT Goal Formulation: With patient Time For Goal Achievement: 08/01/14 Potential to Achieve Goals: Good ADL Goals Pt Will Perform Grooming: with modified independence;standing Pt Will Perform Lower Body Dressing: with modified independence;with adaptive equipment;sit to/from stand Pt Will Transfer to Toilet: with modified independence;ambulating;grab bars (elevated commode) Additional ADL Goal #1: Pt will independently verbalize 3/3 back precautions.  Additional ADL Goal #2: Pt will perform bed mobility at supervision level as precursor for ADLs.   OT Frequency: Min 2X/week   Barriers to D/C:            Co-evaluation              End of Session Equipment Utilized During Treatment: Gait belt;Rolling walker;Back brace  Activity Tolerance: Patient tolerated treatment well Patient left: in chair;with call bell/phone within reach;with family/visitor present   Time: 1345-1419 OT Time Calculation (min): 34 min Charges:  OT General Charges $OT Visit: 1 Procedure OT Evaluation $Initial OT Evaluation Tier I: 1 Procedure OT Treatments $Self Care/Home Management : 8-22 mins G-CodesBenito Mccreedy OTR/L [phone] 06/25/2014, 3:04 PM

## 2014-06-25 NOTE — Progress Notes (Signed)
Patient ID: Adam Bennett, male   DOB: 1940-12-05, 73 y.o.   MRN: [id number] Stable. Cbc, lytes wnl. No weakness,no headache. hospitalist to help [place]

## 2014-06-25 NOTE — Care Management Note (Addendum)
  Page 1 of 1   06/25/2014     3:33:41 PM CARE MANAGEMENT NOTE 06/25/2014  Patient:  Adam Bennett, Adam Bennett   Account Number:  [id number]  Date Initiated:  06/25/2014  Documentation initiated by:  Lorne Skeens  Subjective/Objective Assessment:   Patient was admitted for a lumbar lam with decompression and hardware. Lives at home with spouse.     Action/Plan:   Will follow for discharge needs pending PT/OT evals and physician orders.   Anticipated DC Date:     Anticipated DC Plan:  HOME/SELF CARE         Choice offered to / List presented to:             Status of service:   Medicare Important Message given?  YES (If response is "NO", the following Medicare IM given date fields will be blank) Date Medicare IM given:  06/25/2014 Medicare IM given by:  Lorne Skeens Date Additional Medicare IM given:   Additional Medicare IM given by:    Discharge Disposition:    Per UR Regulation:    If discussed at Long Length of Stay Meetings, dates discussed:    Comments:  06/25/14 [place] RN, MSN, CM- Medicare IM letter provided.

## 2014-06-25 NOTE — Progress Notes (Signed)
PT got patient up to chair with 2 assist. 30 minutes later patient stated that he felt like he was going to faint.  VSs completed and unstable. Notified Dr. Joya Salm. 1liter bolus, stat CXR and labs ordered. He stated to get patient back to bed when VSs were stable and patient did not feel faint.  PT assisted with getting patient back to bed.  Wife and family in the room during this time.

## 2014-06-25 NOTE — Progress Notes (Signed)
Patient ID: Adam Bennett, male   DOB: 13-Apr-1941, 73 y.o.   MRN: [id number] No weakness, wound dry. Plan oob

## 2014-06-26 DIAGNOSIS — K219 Gastro-esophageal reflux disease without esophagitis: Secondary | ICD-10-CM

## 2014-06-26 LAB — URINALYSIS, ROUTINE W REFLEX MICROSCOPIC
BILIRUBIN URINE: NEGATIVE
Glucose, UA: NEGATIVE mg/dL
Ketones, ur: NEGATIVE mg/dL
Leukocytes, UA: NEGATIVE
NITRITE: NEGATIVE
PH: 6 (ref 5.0–8.0)
Protein, ur: 30 mg/dL — AB
Urobilinogen, UA: 1 mg/dL (ref 0.0–1.0)

## 2014-06-26 LAB — URINE MICROSCOPIC-ADD ON

## 2014-06-26 MED ORDER — TAMSULOSIN HCL 0.4 MG PO CAPS
0.8000 mg | ORAL_CAPSULE | Freq: Every day | ORAL | Status: DC
  Administered 2014-06-26 – 2014-06-29 (×4): 0.8 mg via ORAL
  Filled 2014-06-26 (×4): qty 2

## 2014-06-26 MED ORDER — BISACODYL 10 MG RE SUPP
10.0000 mg | Freq: Every day | RECTAL | Status: DC | PRN
  Administered 2014-06-26: 10 mg via RECTAL
  Filled 2014-06-26: qty 1

## 2014-06-26 MED ORDER — METOPROLOL TARTRATE 1 MG/ML IV SOLN
5.0000 mg | INTRAVENOUS | Status: DC | PRN
  Administered 2014-06-26: 5 mg via INTRAVENOUS
  Filled 2014-06-26: qty 5

## 2014-06-26 MED ORDER — SODIUM CHLORIDE 0.9 % IV SOLN
INTRAVENOUS | Status: DC
  Administered 2014-06-26: 14:00:00 via INTRAVENOUS

## 2014-06-26 NOTE — Progress Notes (Signed)
RN alerted that pt HR was sustaining in the 130's. Pt in afib. BP 122/62. pt complaining "I just dont feel good". MD notified orders given for metoprolol. Will continue to monitor pt.

## 2014-06-26 NOTE — Clinical Documentation Improvement (Signed)
Clinical Documentation Clarification #1:   Possible Clinical Conditions?   ______Dural Tear ______Other Condition ______Cannot Clinically Determine    Risk Factors: There was a small opening in the dura, which was taken care with 3 stitches of 6-0 Nurolon with the help of the  Microscope, per 8/29 Op note.  Treatment: Continue flat bedrest for now per 8/29 progress notes.  ________________________________________________________________________________________________ Clinical Documentation Clarification #2:   Possible Clinical Conditions?  ______Acute Blood Loss Anemia ______Other Condition ______Cannot Clinically Determine   Risk Factors: Diffuse oozing most likely secondary to the anti inflammatory, per 8/29 progress notes. EBL: 1000 ml per 8/28 Anesthesia record.  Labs: h/h: 8/26: 15.5/44.9 8/31: 11.7/34.6  Treatment: Cell saver: 450 ml per 8/28 Anesthesia record. Albumin, human: 500 ml per 8/28 Anesthesia record. LR: 2200 ml per 8/28 Anesthesia record.   Thank You, Theron Arista, Clinical Documentation Specialist:  [phone]  [facility]

## 2014-06-26 NOTE — Progress Notes (Signed)
UR COMPLETED  

## 2014-06-26 NOTE — Progress Notes (Signed)
RN notified by off-going RN that pt. Has not voided since foley discontinued. Bladder scan of pt. Bladder showed 200cc. Pt. Denies any pain or discomfort to abdomen at this time. RN will continue to monitor for changes in condition. Kolby Myung, Katherine Roan

## 2014-06-26 NOTE — Progress Notes (Signed)
Patient ID: Adam Bennett, male   DOB: May 12, 1941, 73 y.o.   MRN: [id number] Stable, some weakness at the right quadriceps level, sensory niormal. Foley in place. On flomax. Rehabilitation medicine to see.

## 2014-06-26 NOTE — Progress Notes (Signed)
Physical Therapy Treatment Patient Details Name: Adam Bennett MRN: [id number] DOB: Nov 15, 1940 Today's Date: 06/26/2014    History of Present Illness 73 y.o. s/p L3 Laminectomy with L4-5 Diskectomy/cages/posterolateral arthrodesis/pedicle screws (N/A) - L3 Laminectomy with L4-5 Diskectomy/cages/posterolateral arthrodesis/pedicle screws    PT Comments    Pt slowly progressing. Provided LE there ex plan to improve LE strength. Pt with improved amb to 2' with RW with max encouragement. Pt con't to require min to modA for transfers. If pt can not achieve supervision level of function for safe transition home with spouse pt will need ST-SNF to allow for maximal functional recovery for safe transition home with spouse.   Follow Up Recommendations  Home health PT;Supervision/Assistance - 24 hour     Equipment Recommendations  Rolling walker with 5" wheels;3in1 (PT)    Recommendations for Other Services       Precautions / Restrictions Precautions Precautions: Back;Fall Precaution Booklet Issued: Yes (comment) Precaution Comments: pt re-educated due to poor recall Required Braces or Orthoses: Spinal Brace Spinal Brace: Lumbar corset;Applied in sitting position Restrictions Weight Bearing Restrictions: No    Mobility  Bed Mobility Overal bed mobility: Needs Assistance Bed Mobility: Rolling;Sit to Sidelying Rolling: Supervision (max directional v/c's for technique) Sidelying to sit: Mod assist       General bed mobility comments: pt extremely slow, max directional v/c's, mina for LE management, modA for trunk elevation, pt with difficulty pushing self up with both R and L UEs  Transfers Overall transfer level: Needs assistance Equipment used: Rolling walker (2 wheeled) Transfers: Sit to/from Stand Sit to Stand: Mod assist;From elevated surface         General transfer comment: max v/c's for safe hand placement, modA to initiate transfers, max encouragement to achieve terminal  knee extension once in standing  Ambulation/Gait Ambulation/Gait assistance: Mod assist;+2 safety/equipment Ambulation Distance (Feet): 50 Feet Assistive device: Rolling walker (2 wheeled) Gait Pattern/deviations: Step-to pattern;Narrow base of support;Trunk flexed Gait velocity: slow   General Gait Details: pt initiatl modA to maintain balance with 2nd person for IV pole and chair but as the "stiffness" improved pt able to increased step length. however remained in trunk flexion and significant bilat UE WBing. max encouragement to complete 50 feet of ambulation   Stairs            Wheelchair Mobility    Modified Rankin (Stroke Patients Only)       Balance Overall balance assessment: Needs assistance Sitting-balance support: Feet supported;Single extremity supported Sitting balance-Leahy Scale: Poor Sitting balance - Comments: pt with weak trunk to maintain static sitting balance     Standing balance-Leahy Scale: Poor Standing balance comment: requires RW                    Cognition Arousal/Alertness: Awake/alert Behavior During Therapy: Flat affect Overall Cognitive Status: Within Functional Limits for tasks assessed                      Exercises General Exercises - Lower Extremity Gluteal Sets: AROM;Both;10 reps;Seated Long Arc Quad: AROM;Both;10 reps;Seated Hip Flexion/Marching: AROM;Both;10 reps;Seated    General Comments        Pertinent Vitals/Pain Pain Assessment: 0-10 Pain Score: 8  Pain Location: at surgical site Pain Descriptors / Indicators: Constant Pain Intervention(s): Patient requesting pain meds-RN notified    Home Living                      Prior Function  PT Goals (current goals can now be found in the care plan section) Acute Rehab PT Goals Patient Stated Goal: to get stronger and come home  Progress towards PT goals: Progressing toward goals    Frequency  Min 5X/week    PT Plan Current  plan remains appropriate    Co-evaluation             End of Session Equipment Utilized During Treatment: Gait belt;Back brace Activity Tolerance: Patient limited by fatigue;Patient limited by pain Patient left: in chair;with call bell/phone within reach;with family/visitor present;with chair alarm set     Time: 9390-3009 PT Time Calculation (min): 38 min  Charges:  $Gait Training: 23-37 mins $Therapeutic Activity: 8-22 mins                    G Codes:      Kingsley Callander 06/26/2014, 9:48 AM  Kittie Plater, PT, DPT Pager #: [phone] Office #: [phone]

## 2014-06-26 NOTE — Progress Notes (Signed)
Bladder scan was done on Pt because Pt has not void since foley being d/c'd 1330 06-25-14. Bladder scan showed 470cc. Pt was In and Out cath per order and 500cc was removed.

## 2014-06-26 NOTE — Consult Note (Signed)
[facility] Progress Note   Adam Bennett DTO:[id number] DOB: 05/05/1941 DOA: 06/22/2014 PCP: [name], MD  Brief narrative: Adam Bennett is a 73 y.o. male admitted for lumbar lumbar laminectomy and discectomy on 8/28. Developed orthostatic hypotension, syncope and rapid- a-fib on 8/31 and therefore, hospitalist team consulted.     Subjective: No complaints.   Assessment/Plan: Principal Problem:  Syncope - likely due to orthostasis and rapid a-fib  - CT chest negative for PE    Hypotension, unspecified - due to dehydration- noted urine specific gravity > 1.046 - resolved with IVF- will cont slow IVF at 50 cc/ hr and monitor I and O   Active Problems:  Atrial fibrillation - reviewed ECHO report from [facility]- has mild dilatation of left atrium - I spoke with his cardiology Dr Kirkland Hun who is currently suspecting this episode was likely related to recent surgery and severe dehydration- he is recommeneding  we continue ASA for now and hold off on further anticoagulation. He will see the patient in the office in a few wks and consider an event monitor.     Spondylolisthesis of lumbar region - management per NS     Essential hypertension, benign - BP still too low to resume antihypertensives  Urinary retention - foley replaced yesterday - recommend voiding trial in next 24-48 hrs - cont Flomax    GERD (gastroesophageal reflux disease) - cont Pepcid    Code Status: full code Family Communication: with family at bedside Disposition Plan: per NS   Antibiotics: Anti-infectives   Start     Dose/Rate Route Frequency Ordered Stop   06/22/14 1530  ceFAZolin (ANCEF) IVPB 1 g/50 mL premix     1 g 100 mL/hr over 30 Minutes Intravenous Every 8 hours 06/22/14 1522 06/23/14 0213   06/22/14 1243  vancomycin (VANCOCIN) powder  Status:  Discontinued       As needed 06/22/14 1243 06/22/14 1338   06/22/14 1231  vancomycin (VANCOCIN) 1000 MG powder    Comments:  Latricia Heft   : cabinet override      06/22/14 1231 06/23/14 0044   06/22/14 0600  ceFAZolin (ANCEF) IVPB 2 g/50 mL premix     2 g 100 mL/hr over 30 Minutes Intravenous On call to O.R. 06/21/14 1406 06/22/14 0914         Objective: Filed Weights   06/22/14 0610 06/22/14 1637  Weight: 95.709 kg (211 lb) 95.709 kg (211 lb)    Intake/Output Summary (Last 24 hours) at 06/26/14 1336 Last data filed at 06/26/14 1000  Gross per 24 hour  Intake   2475 ml  Output   1750 ml  Net    725 ml     Vitals Filed Vitals:   06/25/14 2055 06/26/14 0151 06/26/14 0539 06/26/14 0957  BP: 108/65 99/56 106/62 107/66  Pulse: 109 93 103 110  Temp: 100.2 F (37.9 C) 97.8 F (36.6 C) 99.2 F (37.3 C) 98.4 F (36.9 C)  TempSrc: Oral Oral Oral Oral  Resp: 18 20 20 20   Height:      Weight:      SpO2: 96% 97% 99% 99%    Exam: General: No acute respiratory distress Lungs: Clear to auscultation bilaterally without wheezes or crackles Cardiovascular: Regular rate and rhythm without murmur gallop or rub normal S1 and S2 Abdomen: Nontender, nondistended, soft, bowel sounds positive, no rebound, no ascites, no appreciable mass Extremities: No significant cyanosis, clubbing, or edema bilateral lower extremities  Data Reviewed: Basic Metabolic Panel:  Recent Labs Lab 06/20/14 1357 06/25/14 1547 06/25/14 2030  NA 141 136*  --   K 4.8 4.8  --   CL 102 100  --   CO2 26 27  --   GLUCOSE 109* 156*  --   BUN 14 15  --   CREATININE 0.89 1.09  --   CALCIUM 9.4 8.5  --   MG  --   --  1.8   Liver Function Tests: No results found for this basename: AST, ALT, ALKPHOS, BILITOT, PROT, ALBUMIN,  in the last 168 hours No results found for this basename: LIPASE, AMYLASE,  in the last 168 hours No results found for this basename: AMMONIA,  in the last 168 hours CBC:  Recent Labs Lab 06/20/14 1357 06/25/14 1547  WBC 8.8 14.5*  HGB 15.5 11.7*  HCT 44.9 34.6*  MCV 87.5 87.6  PLT 221 179   Cardiac  Enzymes: No results found for this basename: CKTOTAL, CKMB, CKMBINDEX, TROPONINI,  in the last 168 hours BNP (last 3 results) No results found for this basename: PROBNP,  in the last 8760 hours CBG: No results found for this basename: GLUCAP,  in the last 168 hours  Recent Results (from the past 240 hour(s))  SURGICAL PCR SCREEN     Status: None   Collection Time    06/20/14  1:44 PM      Result Value Ref Range Status   MRSA, PCR NEGATIVE  NEGATIVE Final   Staphylococcus aureus NEGATIVE  NEGATIVE Final   Comment:            The Xpert SA Assay (FDA     approved for NASAL specimens     in patients over 45 years of age),     is one component of     a comprehensive surveillance     program.  Test performance has     been validated by [employer] for patients greater     than or equal to 18 year old.     It is not intended     to diagnose infection nor to     guide or monitor treatment.     Studies:  Recent x-ray studies have been reviewed in detail by the Attending Physician  Scheduled Meds:  Scheduled Meds: . sodium chloride   Intravenous Once  . bupivacaine liposome  20 mL Infiltration Once  . docusate sodium  100 mg Oral BID  . famotidine  20 mg Oral BID  . sodium chloride  3 mL Intravenous Q12H  . tamsulosin  0.8 mg Oral Daily   Continuous Infusions: . sodium chloride    . sodium chloride 100 mL/hr at 06/23/14 1856  . lactated ringers 50 mL/hr at 06/25/14 2129    Time spent on care of this patient: >35 min   [place], MD 06/26/2014, 1:36 PM  LOS: 4 days   [facility] Office  [phone] Pager - Text Page per [email or web address]  If 7PM-7AM, please contact night-coverage [email or web address]

## 2014-06-26 NOTE — Progress Notes (Signed)
Patient ID: Adam Bennett, male   DOB: Jan 25, 1941, 73 y.o.   MRN: [id number] Wound dry. Ambulated with help

## 2014-06-26 NOTE — Progress Notes (Signed)
On coming RN notified of pt. Inability to void after foley catheter discontinued. Bladder scan completed. 470cc noted in bladder. On coming RN to complete I/O cath per order. Tyisha Cressy, Katherine Roan

## 2014-06-26 NOTE — Progress Notes (Addendum)
Occupational Therapy Treatment Patient Details Name: Adam Bennett MRN: [id number] DOB: 10/10/1941 Today's Date: 06/26/2014    History of present illness 73 y.o. s/p L3 Laminectomy with L4-5 Diskectomy/cages/posterolateral arthrodesis/pedicle screws (N/A) - L3 Laminectomy with L4-5 Diskectomy/cages/posterolateral arthrodesis/pedicle screws   OT comments  Pt progressing. Education provided. Recommending HHOT upon d/c if pt can achieve supervision level. If not, recommend SNF for d/c prior to d/c home.   Follow Up Recommendations  Home health OT;Supervision/Assistance - 24 hour    Equipment Recommendations  Other (comment) (AE)    Recommendations for Other Services      Precautions / Restrictions Precautions Precautions: Back;Fall Precaution Booklet Issued: Yes (comment) Precaution Comments: Reviewed precautions Required Braces or Orthoses: Spinal Brace Spinal Brace: Lumbar corset;Applied in sitting position Restrictions Weight Bearing Restrictions: No       Mobility Bed Mobility Overal bed mobility: Needs Assistance Bed Mobility: Rolling;Sidelying to Sit;Sit to Sidelying Rolling: Supervision Sidelying to sit: Min guard     Sit to sidelying: Min assist;Mod assist General bed mobility comments: Cues for technique. Assist with LE's when going to sidelying position.  Transfers Overall transfer level: Needs assistance Equipment used: Rolling walker (2 wheeled) Transfers: Sit to/from Stand Sit to Stand: Mod assist;Max assist         General transfer comment: cues for technique.    Balance Overall balance assessment: Needs assistance  Sitting balance-Leahy Scale: Poor (decreased balance while trying to don brace) Standing balance support: During functional activity Standing balance-Leahy Scale: Poor Standing balance comment: requires RW                   ADL Overall ADL's : Needs assistance/impaired     Grooming: Wash/dry face;Oral care;Moderate  assistance;Sitting;Standing           Upper Body Dressing : Moderate assistance;Sitting (back brace)       Toilet Transfer: Moderate assistance;Ambulation;BSC;RW           Functional mobility during ADLs: Moderate assistance;Rolling walker General ADL Comments: Educated on safety-use of bag on walker, safe shoewear, rugs, and recommended sitting for LB ADLs. Reviewed to use cup for teeth care and placement of grooming items. Pt ambulated to bathroom and sat on BSC to brush teeth (legs needed a break). Stood at sink to perform another grooming task and Mod A required for balance.  Discussed incorporating back precautions into functional activities. Reviewed how to use reacher for LB dressing. Educated to be pumping ankles. Discussed positioning of pillows.      Vision                     Perception     Praxis      Cognition   Behavior During Therapy: WFL for tasks assessed/performed Overall Cognitive Status: Within Functional Limits for tasks assessed                       Extremity/Trunk Assessment                  Shoulder Instructions       General Comments      Pertinent Vitals/ Pain       Pain Assessment: 0-10 Pain Score: 4  Pain Location: back Pain Descriptors / Indicators: Aching Pain Intervention(s): Repositioned  Home Living  Prior Functioning/Environment              Frequency Min 2X/week     Progress Toward Goals  OT Goals(current goals can now be found in the care plan section)  Progress towards OT goals: Progressing toward goals  Acute Rehab OT Goals Patient Stated Goal: get stronger OT Goal Formulation: With patient Time For Goal Achievement: 08/01/14 Potential to Achieve Goals: Good ADL Goals Pt Will Perform Grooming: with modified independence;standing Pt Will Perform Lower Body Dressing: with modified independence;with adaptive equipment;sit  to/from stand Pt Will Transfer to Toilet: with modified independence;ambulating;grab bars (elevated commode) Additional ADL Goal #1: Pt will independently verbalize 3/3 back precautions.  Additional ADL Goal #2: Pt will perform bed mobility at supervision level as precursor for ADLs.   Plan Discharge plan needs to be updated    Co-evaluation                 End of Session Equipment Utilized During Treatment: Gait belt;Rolling walker;Back brace   Activity Tolerance Patient tolerated treatment well   Patient Left in bed;with call bell/phone within reach;with bed alarm set   Nurse Communication          Time: 8413-2440 OT Time Calculation (min): 30 min  Charges: OT General Charges $OT Visit: 1 Procedure OT Treatments $Self Care/Home Management : 8-22 mins $Therapeutic Activity: 8-22 mins  Benito Mccreedy OTR/L [phone] 06/26/2014, 11:56 AM

## 2014-06-26 NOTE — Progress Notes (Signed)
Physical medicine and rehabilitation consult requested. [employer] Medicare will not approve this patient for inpatient rehabilitation services. Recommendations are for home versus skilled nursing facility.

## 2014-06-27 ENCOUNTER — Encounter (HOSPITAL_COMMUNITY): Payer: Self-pay | Admitting: Physician Assistant

## 2014-06-27 DIAGNOSIS — I10 Essential (primary) hypertension: Secondary | ICD-10-CM

## 2014-06-27 DIAGNOSIS — D649 Anemia, unspecified: Secondary | ICD-10-CM

## 2014-06-27 DIAGNOSIS — I4891 Unspecified atrial fibrillation: Secondary | ICD-10-CM

## 2014-06-27 LAB — BASIC METABOLIC PANEL
ANION GAP: 13 (ref 5–15)
BUN: 12 mg/dL (ref 6–23)
CO2: 24 meq/L (ref 19–32)
CREATININE: 0.75 mg/dL (ref 0.50–1.35)
Calcium: 8.6 mg/dL (ref 8.4–10.5)
Chloride: 100 mEq/L (ref 96–112)
GFR calc Af Amer: >90 mL/min (ref >90)
GFR calc non Af Amer: >90 mL/min (ref >90)
GLUCOSE: 145 mg/dL — AB (ref 70–99)
Potassium: 4.4 mEq/L (ref 3.7–5.3)
Sodium: 137 mEq/L (ref 137–147)

## 2014-06-27 LAB — HEMOGLOBIN A1C
HEMOGLOBIN A1C: 6.1 % — AB (ref <5.7)
Mean Plasma Glucose: 128 mg/dL — ABNORMAL HIGH (ref <117)

## 2014-06-27 LAB — CBC
HEMATOCRIT: 33.2 % — AB (ref 39.0–52.0)
HEMOGLOBIN: 11.3 g/dL — AB (ref 13.0–17.0)
MCH: 30.4 pg (ref 26.0–34.0)
MCHC: 34 g/dL (ref 30.0–36.0)
MCV: 89.2 fL (ref 78.0–100.0)
Platelets: 299 10*3/uL (ref 150–400)
RBC: 3.72 MIL/uL — ABNORMAL LOW (ref 4.22–5.81)
RDW: 13.7 % (ref 11.5–15.5)
WBC: 7.5 10*3/uL (ref 4.0–10.5)

## 2014-06-27 LAB — POCT I-STAT 4, (NA,K, GLUC, HGB,HCT)
Glucose, Bld: 138 mg/dL — ABNORMAL HIGH (ref 70–99)
HCT: 35 % — ABNORMAL LOW (ref 39.0–52.0)
Hemoglobin: 11.9 g/dL — ABNORMAL LOW (ref 13.0–17.0)
Potassium: 4.7 mEq/L (ref 3.7–5.3)
SODIUM: 135 meq/L — AB (ref 137–147)

## 2014-06-27 MED ORDER — HYDROCODONE-ACETAMINOPHEN 7.5-325 MG/15ML PO SOLN
10.0000 mL | ORAL | Status: DC | PRN
  Administered 2014-06-27 – 2014-06-29 (×6): 10 mL via ORAL
  Filled 2014-06-27 (×6): qty 15

## 2014-06-27 MED ORDER — DILTIAZEM HCL 25 MG/5ML IV SOLN
15.0000 mg | Freq: Once | INTRAVENOUS | Status: DC
  Filled 2014-06-27: qty 5

## 2014-06-27 MED ORDER — MINERAL OIL RE ENEM
1.0000 | ENEMA | Freq: Once | RECTAL | Status: AC
  Administered 2014-06-27: 1 via RECTAL
  Filled 2014-06-27: qty 1

## 2014-06-27 MED ORDER — SODIUM CHLORIDE 0.9 % IV BOLUS (SEPSIS)
250.0000 mL | Freq: Once | INTRAVENOUS | Status: AC
  Administered 2014-06-27: 250 mL via INTRAVENOUS

## 2014-06-27 MED ORDER — DILTIAZEM HCL 30 MG PO TABS
60.0000 mg | ORAL_TABLET | Freq: Three times a day (TID) | ORAL | Status: DC
  Administered 2014-06-27 – 2014-06-28 (×3): 60 mg via ORAL
  Filled 2014-06-27 (×3): qty 2

## 2014-06-27 NOTE — Progress Notes (Signed)
Patient ID: Adam Bennett, male   DOB: [birth year], 73 y.o.   MRN: [id number] Temp down this am. Pain in lower quadrant. Foley working. Vital signs wnl. Rehabilitation medicine to see

## 2014-06-27 NOTE — Consult Note (Signed)
Cardiology Consultation Note  Patient ID: Adam Bennett, MRN: [id number], DOB/AGE: [birth year] 73 y.o. Admit date: 06/22/2014   Date of Consult: 06/27/2014 Primary Physician: Adam Brome, MD Primary Cardiologist: Dr. Agustin Bennett (sees him for HTN)  Chief Complaint: weakness Reason for Consult: post-op AF  HPI: Adam Bennett is a 73 y/o M with history of HTN, GERD and no significant cardiac history who presented to [facility] 8/29 for planned lumbar laminectomy and decompression due to known spinal stenosis, spondylolisthesis, and lumbar radiculopathic symptoms. Pre-op EKG 06/20/14 showed NSR. Admitting HR 06/23/14 was 75. During his stay since surgery it appears his HR has ranged from 60s then occasionally low 100s. On 06/24/14 he had low grade fever felt due to atelectasis. On 06/25/14 PT got the patient up to a chair with 2 person assist and he felt very weak like he was going to faint. He did not fully pass out. BP was 76/51, pulse documented as 68 at 2:40pm. He was given a liter of NS and placed back in bed. Next several HRs - 106->64->98->107->126. EKG was obtained at 4:39pm showing atrial fib with RVR 117bpm, left axis deviation, nonspecific ST-T changes. TSH was normal. IV beta blocker was given once on 8/31 and again on 9/1 due to higher HRs (briefly 130-150s at times) once BP had improved. CTA was negative for PE. His home Lisinopril/Norvasc were held and he was given IV fluids. Note UA showed high specific gravity and moderate Hgb. He has had some constipation as well as urinary retention requiring foley. HR currently about 115. He is unaware of palpitations. He just feels tired. He is very active at home usually albeit with leg pain recently due to radiculopathy symptoms. He denies any recent chest pain, SOB, nausea, vomiting, LEE or syncope. He does have intermittent blood in his sinus drainage that he attributes to chronic sinus issues but no major h/o bleeding. No h/o TIA/CVA. Temp 100.3 yesterday but normalized  today without any obvious source of infection. It appears he did convert to NSR for a brief period of time ~11am-12pm yesterday but went back into AF.   2D Echo 06/14/14 done for pre-op eval from [facility] cardiologist: NSR with extra systolic beats. Technically difficult with suboptimal views. Mildly dilated LV. EF 55-60%. Diastolic filling pattern indicates impaired relaxation. LA mildly dilated. Mild AV sclerosis. Trace TR. RVSP 29mmHg. Per primary cardiologist notes he was supposed to have a sleep study recently but it was postponed.  Past Medical History  Diagnosis Date  . Hypertension   . GERD (gastroesophageal reflux disease)   . Chronic sinus complaints       Most Recent Cardiac Studies: 2D Echo 06/14/14 NSR with extra systolic beats. Technically difficult with suboptimal views. Mildly dilated LV. EF 55-60%. Diastolic filling pattern indicates impaired relaxation. LA mildly dilated. Mild AV sclerosis. Trace TR. RVSP 32mmHg.    Surgical History:  Past Surgical History  Procedure Laterality Date  . Joint replacement      BILATER KNEE REPLACEMENTS  . Cardiac catheterization      DONE IN [place], 71YRS AGO  . Cholecystectomy    . Hernia repair    . Foot surgery    . Transurethral resection of prostate       Home Meds: Prior to Admission medications   Medication Sig Start Date End Date Taking? Authorizing Provider  amLODipine (NORVASC) 10 MG tablet Take 10 mg by mouth daily.   Yes Historical Provider, MD  aspirin EC 81 MG tablet Take 81 mg  by mouth daily.   Yes Historical Provider, MD  Chlorpheniramine Maleate (ALLERGY PO) Take 1 tablet by mouth daily as needed (allergies).   Yes Historical Provider, MD  lisinopril (PRINIVIL,ZESTRIL) 5 MG tablet Take 5 mg by mouth daily.   Yes Historical Provider, MD  Multiple Vitamin (MULTIVITAMIN WITH MINERALS) TABS tablet Take 1 tablet by mouth daily.   Yes Historical Provider, MD  naproxen sodium (ANAPROX) 220 MG tablet Take 220 mg by mouth  2 (two) times daily with a meal.   Yes Historical Provider, MD  omeprazole (PRILOSEC) 20 MG capsule Take 20 mg by mouth 2 (two) times daily before a meal.   Yes Historical Provider, MD  tetrahydrozoline (VISINE) 0.05 % ophthalmic solution Place 1 drop into both eyes daily as needed (dry eyes).   Yes Historical Provider, MD    Inpatient Medications:  . sodium chloride   Intravenous Once  . bupivacaine liposome  20 mL Infiltration Once  . diltiazem  15 mg Intravenous Once  . diltiazem  60 mg Oral 3 times per day  . docusate sodium  100 mg Oral BID  . famotidine  20 mg Oral BID  . mineral oil  1 enema Rectal Once  . sodium chloride  250 mL Intravenous Once  . sodium chloride  3 mL Intravenous Q12H  . tamsulosin  0.8 mg Oral Daily   . sodium chloride    . sodium chloride 100 mL/hr at 06/23/14 1856  . sodium chloride 50 mL/hr at 06/26/14 1402    Allergies:  Allergies  Allergen Reactions  . Norco [Hydrocodone-Acetaminophen] Itching  . Oxycodone Itching    CAN TAKE WITH BENADRYL    History   Social History  . Marital Status: Married    Spouse Name: N/A    Number of Children: N/A  . Years of Education: N/A   Occupational History  . Not on file.   Social History Main Topics  . Smoking status: Never Smoker   . Smokeless tobacco: Not on file  . Alcohol Use: Yes     Comment: occasional/rare  . Drug Use: No  . Sexual Activity: Not on file   Other Topics Concern  . Not on file   Social History Narrative  . No narrative on file     Family History  Problem Relation Age of Onset  . Congestive Heart Failure Father   . Congestive Heart Failure Brother   . CAD Father   . CAD Brother   . Hypertension    . Diabetes    . Colon cancer Mother      Review of Systems: General: negative for chills or weight changes.  Cardiovascular: negative for chest pain, edema, orthopnea, palpitations, paroxysmal nocturnal dyspnea, shortness of breath or dyspnea on  exertion Dermatological: negative for rash Respiratory: negative for cough or wheezing. Occasionally has blood tinged nasal drainage from chronic sinus issues per wife/patient (coughed this up x 1 but no significant other coughing) Urologic: negative for hematuria Abdominal: negative for nausea, vomiting, diarrhea, bright red blood per rectum, melena, or hematemesis Neurologic: negative for visual changes, syncope, or dizziness All other systems reviewed and are otherwise negative except as noted above.  Labs:  Lab Results  Component Value Date   WBC 14.5* 06/25/2014   HGB 11.7* 06/25/2014   HCT 34.6* 06/25/2014   MCV 87.6 06/25/2014   PLT 179 06/25/2014     Recent Labs Lab 06/25/14 1547  NA 136*  K 4.8  CL 100  CO2 27  BUN 15  CREATININE 1.09  CALCIUM 8.5  GLUCOSE 156*   Radiology/Studies:  Dg Chest 2 View  06/20/2014   CLINICAL DATA:  Preop for lumbar surgery  EXAM: CHEST  2 VIEW  COMPARISON:  12/17/2009  FINDINGS: Cardiomediastinal silhouette is stable. No acute infiltrate or pleural effusion. No pulmonary edema. Degenerative changes thoracic spine.  IMPRESSION: No active cardiopulmonary disease.   Electronically Signed   By: Lahoma Crocker M.D.   On: 06/20/2014 14:39   Dg Lumbar Spine 2-3 Views 06/22/2014   CLINICAL DATA:  Post fusion  EXAM: LUMBAR SPINE - 2-3 VIEW; DG C-ARM 61-120 MIN  COMPARISON:  None  FINDINGS: Two views of the lumbar spine submitted. Pedicular screws are noted L4 and L5 vertebral body. Postsurgical intervertebral discs material noted L4-L5 level. There is disc space flattening with mild anterior spurring at L5-S1 level.  IMPRESSION: Postsurgical changes at L4-L5 level.  The alignment is preserved.  Fluoroscopy time was 34 seconds.   Electronically Signed   By: Lahoma Crocker M.D.   On: 06/22/2014 13:09   Ct Angio Chest Pe W/cm &/or Wo Cm 06/25/2014   CLINICAL DATA:  Hypoxia.  Recent lumbar spine surgery.  EXAM: CT ANGIOGRAPHY CHEST WITH CONTRAST  TECHNIQUE:  Multidetector CT imaging of the chest was performed using the standard protocol during bolus administration of intravenous contrast. Multiplanar CT image reconstructions and MIPs were obtained to evaluate the vascular anatomy.  CONTRAST:  24mL OMNIPAQUE IOHEXOL 350 MG/ML SOLN  COMPARISON:  Current chest radiograph  FINDINGS: There is no evidence of a pulmonary embolus. The thoracic aorta is normal in caliber with no evidence of dissection.  Heart is normal in size. No neck base, axillary, mediastinal or hilar masses or pathologically enlarged lymph nodes. Prominent soft tissue along the hila is most likely due to sub cm lymph nodes.  Small, right greater left, pleural effusions. There is dependent subsegmental atelectasis. No lung consolidation or convincing pulmonary edema. No pneumothorax  Limited visualization of the upper abdomen is unremarkable.  There are degenerative changes throughout the visualized spine.  Review of the MIP images confirms the above findings.  IMPRESSION: 1. No evidence of a pulmonary embolus. 2. Small bilateral pleural effusions. Dependent subsegmental atelectasis. No convincing pneumonia or pulmonary edema.   Electronically Signed   By: Lajean Manes M.D.   On: 06/25/2014 19:56   Dg Lumbar Spine 1 View 06/22/2014   CLINICAL DATA:  L4-5 fusion.  EXAM: LUMBAR SPINE - 1 VIEW  COMPARISON:  MRI scan of June 13, 2014.  FINDINGS: Single lateral intraoperative view of the lumbar spine demonstrates surgical retractors in the soft tissues posterior to L3, L4 and L5. One surgical probe is seen directed toward the posterior portion of the L4 vertebral body, while another is directed toward the posterior portion of the L5 vertebral body. Degenerative disc disease is noted at L4-5 and L5-S1.  IMPRESSION: Surgical localization as described above.   Electronically Signed   By: Sabino Dick M.D.   On: 06/22/2014 13:00   Dg Chest Port 1 View 06/25/2014   CLINICAL DATA:  Syncope and shortness of  breath  EXAM: PORTABLE CHEST - 1 VIEW  COMPARISON:  PA and lateral chest of June 20, 2014  FINDINGS: The lungs are adequately inflated. There is no focal infiltrate. The heart and pulmonary vascularity are normal. There is no pleural effusion. There is degenerative disc change of the midcervical spine.  IMPRESSION: There is no acute cardiopulmonary abnormality.   Electronically Signed  By: David  Martinique   On: 06/25/2014 15:41   Dg C-arm 1-60 Min 06/22/2014   CLINICAL DATA:  Post fusion  EXAM: LUMBAR SPINE - 2-3 VIEW; DG C-ARM 61-120 MIN  COMPARISON:  None  FINDINGS: Two views of the lumbar spine submitted. Pedicular screws are noted L4 and L5 vertebral body. Postsurgical intervertebral discs material noted L4-L5 level. There is disc space flattening with mild anterior spurring at L5-S1 level.  IMPRESSION: Postsurgical changes at L4-L5 level.  The alignment is preserved.  Fluoroscopy time was 34 seconds.   Electronically Signed   By: Lahoma Crocker M.D.   On: 06/22/2014 13:09   EKG:  EKGs reviewed and show atrial fibrillation with left anxis deviation, mild LVH, nonspecific ST-T changes Pre-op EKG from 8/26 showed NSR with left axis deviation, less voltage criteria for LVH, nonsepcific inferior TW changes  Physical Exam: Blood pressure 129/81, pulse 115, temperature 98.1 F (36.7 C), temperature source Oral, resp. rate 17, height 6\' 3"  (1.905 m), weight 211 lb (95.709 kg), SpO2 96.00%. General: Well developed, well nourished WM, in no acute distress. Head: Normocephalic, atraumatic, sclera non-icteric, no xanthomas, nares are without discharge.  Neck: Negative for carotid bruits. JVD not elevated. Lungs: Clear bilaterally to auscultation without wheezes, rales, or rhonchi. Breathing is unlabored. Heart: Irregularly irregular with S1 S2. No murmurs, rubs, or gallops appreciated. Abdomen: Soft, non-tender, non-distended with normoactive bowel sounds. No hepatomegaly. No rebound/guarding. No obvious  abdominal masses. Msk:  Strength and tone appear normal for age. Extremities: No clubbing or cyanosis. No edema.  Distal pedal pulses are 2+ and equal bilaterally. Neuro: Alert and oriented X 3. No facial asymmetry. No focal deficit. Moves all extremities spontaneously. Psych:  Responds to questions appropriately with a normal affect.   Assessment and Plan:  73 y/o M with h/o HTN, GERD, sinus problems and no prior cardiac hx was admitted 8/29 for planned lumbar surgery. On 8/31 had near-fainting episode with hypotension BP 76/41, was placed on telemetry shortly later that day and noted to be in atrial fib with RVR. Brief conversion 06/26/14 then back to AF. Onset not completely clear as HR's on 8/29 & 8/30 were also in low 100s at times; pulse at time of near-fainting episode was documented as 68. CTA for PE and TSH wnl.  1. Lumbar radiculopathy with stenosis/spondylolisthesis s/p laminectomy/decompression 06/25/14 2. PAF, newly recognized  - Dr. Wynelle Cleveland spoke with primary cardiologist Dr. Kirkland Hun 9/1 "who is currently suspecting this episode was likely related to recent surgery and severe dehydration- he is recommeneding we continue ASA for now and hold off on further anticoagulation. He will see the patient in the office in a few wks and consider an event monitor."  - CHADSVASC = 2 for age, HTN - have also sent off A1C given hyperglycemia - if atrial fib persists beyond expected post-op healing course, we may have to revisit issue of anticoagulation - will d/c IV diltiazem order and just continue oral dilt as ordered (do not feel he needs both) - he should f/u for sleep study as originally planned by primary cardiologist 3. Hypertension with near-syncope when hypotensive 06/25/14  - usual meds on hold while we deal with #2 4. Mild anemia  - Hgb 15->11, possibly related to post-op and IV fluids  - continue to trend and observe for any significant bleeding 5. Urinary retention/abnormal UA  -  suspect picture of dehydration, being tx with IVF  - hematuria may be due to foley but would recommend f/u  for this  Addendum: I originally ordered f/u BMET/CBC for AM but wife is anxious about why we aren't doing these now. Either way is fine with me so for piece of mind I have put them in to be ordered today.  Signed, Melina Copa PA-C 06/27/2014, 12:18 PM

## 2014-06-27 NOTE — Progress Notes (Signed)
Occupational Therapy Treatment Patient Details Name: TYSHAUN VINZANT MRN: [id number] DOB: 1941/03/09 Today's Date: 06/27/2014    History of present illness 73 y.o. s/p L3 Laminectomy with L4-5 Diskectomy/cages/posterolateral arthrodesis/pedicle screws (N/A) - L3 Laminectomy with L4-5 Diskectomy/cages/posterolateral arthrodesis/pedicle screws   OT comments  Pt progressing. Pt's HR below 120 in session. Pt able to ambulate in hallway and ambulated to bathroom to perform grooming tasks. Recommending SNF at this time unless pt can achieve supervision/Mod I level prior to d/c.   Follow Up Recommendations  Supervision/Assistance - 24 hour;SNF    Equipment Recommendations  Other (comment) (AE)    Recommendations for Other Services      Precautions / Restrictions Precautions Precautions: Back;Fall Precaution Booklet Issued: Yes (comment) Precaution Comments: Reviewed precautions Required Braces or Orthoses: Spinal Brace Spinal Brace: Lumbar corset;Applied in sitting position Restrictions Weight Bearing Restrictions: No       Mobility Bed Mobility Overal bed mobility: Needs Assistance Bed Mobility: Rolling;Sidelying to Sit;Sit to Sidelying Rolling: Supervision Sidelying to sit: Mod assist     Sit to sidelying: Min assist General bed mobility comments: Assist with trunk to sit EOB. Assist with LE when getting back in bed.  Transfers Overall transfer level: Needs assistance Equipment used: Rolling walker (2 wheeled) Transfers: Sit to/from Stand Sit to Stand: Mod assist         General transfer comment: Assist to rise.        ADL Overall ADL's : Needs assistance/impaired     Grooming: Wash/dry face;Oral care;Moderate assistance;Sitting;Standing           Upper Body Dressing : Moderate assistance;Sitting;Standing (back brace)       Toilet Transfer: Minimal assistance;Ambulation;BSC           Functional mobility during ADLs: Minimal assistance;+2 for  safety/equipment;Rolling walker General ADL Comments: Pt ambulated to bathroom and performed grooming tasks. Reviewed to use cup for teeth care-cues for precautions during session. Educated on back brace wear. Reviewed to use bag on walker.  Pt ambulated short distance in hallway.  Decreased balance while standing.       Vision                     Perception     Praxis      Cognition   Behavior During Therapy: WFL for tasks assessed/performed Overall Cognitive Status: Within Functional Limits for tasks assessed                       Extremity/Trunk Assessment               Exercises     Shoulder Instructions       General Comments      Pertinent Vitals/ Pain       Pain Assessment: 0-10 Pain Score:  (15) Pain Location: RLE Pain Intervention(s): Repositioned;Monitored during session  Home Living                                          Prior Functioning/Environment              Frequency Min 2X/week     Progress Toward Goals  OT Goals(current goals can now be found in the care plan section)  Progress towards OT goals: Progressing toward goals  Acute Rehab OT Goals Patient Stated Goal: get stronger OT Goal Formulation: With patient Time For  Goal Achievement: 08/01/14 Potential to Achieve Goals: Good ADL Goals Pt Will Perform Grooming: with modified independence;standing Pt Will Perform Lower Body Dressing: with modified independence;with adaptive equipment;sit to/from stand Pt Will Transfer to Toilet: with modified independence;ambulating;grab bars (3 in 1 over commode) Additional ADL Goal #1: Pt will independently verbalize 3/3 back precautions.  Additional ADL Goal #2: Pt will perform bed mobility at supervision level as precursor for ADLs.   Plan Discharge plan needs to be updated    Co-evaluation                 End of Session Equipment Utilized During Treatment: Gait belt;Rolling walker;Back brace    Activity Tolerance Patient tolerated treatment well   Patient Left in bed;with call bell/phone within reach;with bed alarm set;with family/visitor present   Nurse Communication          Time: 2060-1561 OT Time Calculation (min): 21 min  Charges: OT General Charges $OT Visit: 1 Procedure OT Treatments $Self Care/Home Management : 8-22 mins  Benito Mccreedy OTR/L [phone] 06/27/2014, 6:07 PM

## 2014-06-27 NOTE — Consult Note (Signed)
Personally seen and examined. Agree with above.  Paroxsymal atrial fibrillation in the setting of surgery. Hopefully will resolve in the next few days as inflammatory response subsides. PE unremarkable except for irreg irreg, mildly tachycardic.  His main complaint now is urinary retention.  ASA for now (when comfortable from surgery perspective).  OK with DC to rehab once heart rate at rest is below 110 consistently. (Using low dose diltiazem, may need to uptitrate as BP allows). He will follow up with his primary cardiologist.   Candee Furbish, MD

## 2014-06-27 NOTE — Progress Notes (Addendum)
Occupational Therapy Treatment Patient Details Name: Adam Bennett MRN: [id number] DOB: Jul 01, 1941 Today's Date: 06/27/2014    History of present illness 73 y.o. s/p L3 Laminectomy with L4-5 Diskectomy/cages/posterolateral arthrodesis/pedicle screws (N/A) - L3 Laminectomy with L4-5 Diskectomy/cages/posterolateral arthrodesis/pedicle screws   OT comments  Pt session limited due to increased HR. Cardiologist came in during session and pt HR elevated in 120's-140's. Pt sat EOB a few minutes and then OT helped him return to supine due to increased HR. Spouse spoke with OT about being frustrated with care pt has received while here. Spoke with pt and family about going to SNF for rehab prior to d/c home. Will continue to assess SNF versus HHOT.  Follow Up Recommendations  Supervision/Assistance - 24 hour;SNF    Equipment Recommendations  Other (comment) (AE)    Recommendations for Other Services      Precautions / Restrictions Precautions Precautions: Back;Fall Required Braces or Orthoses: Spinal Brace Spinal Brace: Lumbar corset;Applied in sitting position (did not don today-session limited) Restrictions Weight Bearing Restrictions: No       Mobility Bed Mobility Overal bed mobility: Needs Assistance Bed Mobility: Rolling;Sidelying to Sit;Sit to Sidelying Rolling: Supervision;Min assist Sidelying to sit: Min assist     Sit to sidelying: Min assist General bed mobility comments: Min A to help sit EOB. Assistance with LE's when returning to bed.  Transfers                      Balance                                   ADL                                                Vision                     Perception     Praxis      Cognition   Behavior During Therapy: WFL for tasks assessed/performed Overall Cognitive Status: Within Functional Limits for tasks assessed                       Extremity/Trunk  Assessment               Exercises     Shoulder Instructions       General Comments      Pertinent Vitals/ Pain       Pain Assessment: 0-10 Pain Score: 4  Pain Location: right leg Pain Intervention(s): Repositioned  Home Living                                          Prior Functioning/Environment              Frequency Min 2X/week     Progress Toward Goals  OT Goals(current goals can now be found in the care plan section)  Progress towards OT goals: Progressing toward goals  Acute Rehab OT Goals Patient Stated Goal: get stronger OT Goal Formulation: With patient Time For Goal Achievement: 08/01/14 Potential to Achieve Goals: Good ADL Goals Pt Will Perform Grooming: with modified independence;standing Pt Will Perform Lower  Body Dressing: with modified independence;with adaptive equipment;sit to/from stand Pt Will Transfer to Toilet: with modified independence;ambulating;grab bars (elevated commode) Additional ADL Goal #1: Pt will independently verbalize 3/3 back precautions.  Additional ADL Goal #2: Pt will perform bed mobility at supervision level as precursor for ADLs.   Plan Discharge plan needs to be updated    Co-evaluation                 End of Session Equipment Utilized During Treatment: Gait belt;Rolling walker   Activity Tolerance Other (comment) (limited by elevated HR)   Patient Left in bed;with call bell/phone within reach   Nurse Communication          Time: 1051-1101 OT Time Calculation (min): 10 min  Charges: OT General Charges $OT Visit: 1 Procedure OT Treatments $Therapeutic Activity: 8-22 mins  Benito Mccreedy OTR/L [phone] 06/27/2014, 2:32 PM

## 2014-06-27 NOTE — Progress Notes (Signed)
Bladder scan: Post void bladder scan 149ml. Pt denies discomfort at this time.

## 2014-06-27 NOTE — Progress Notes (Signed)
Patient ID: Adam Bennett, male   DOB: 06/10/1941, 73 y.o.   MRN: [id number] Flatus, no bm. Still unable to void

## 2014-06-27 NOTE — Progress Notes (Signed)
[facility] PROGRESS NOTE  SHIA EBER XBM:[id number] DOB: 1940-12-11 DOA: 06/22/2014 PCP: [name], MD  Assessment/Plan: Principal Problem:   Hypotension, unspecified Active Problems:   Spondylolisthesis of lumbar region   Syncope   Atrial fibrillation   Essential hypertension, benign   GERD (gastroesophageal reflux disease)        73 y/o M with h/o HTN, GERD, sinus problems and no prior cardiac hx was admitted 8/29 for planned lumbar surgery. On 8/31 had near-fainting episode with hypotension BP 76/41, was placed on telemetry shortly later that day and noted to be in atrial fib with RVR. Brief conversion 06/26/14 then back to AF.   1. Lumbar radiculopathy with stenosis/spondylolisthesis s/p laminectomy/decompression 06/25/14   2. PAF, newly recognized -cardiology consulted  - Dr. Wynelle Cleveland spoke with primary cardiologist Dr. Kirkland Hun 9/1 "who is currently suspecting this episode was likely related to recent surgery and severe dehydration- he is recommeneding we continue ASA for now and hold off on further anticoagulation. He will see the patient in the office in a few wks and consider an event monitor."  - CHADSVASC = 2 for age, HTN - have also sent off A1C given hyperglycemia  - if atrial fib persists beyond expected post-op healing course, we may have to revisit issue of anticoagulation  Continue PO cardizem and prn metoprolol - he should f/u for sleep study    3. Hypertension with near-syncope when hypotensive 06/25/14  - usual meds on hold     4. Mild anemia  - Hgb 15->11, possibly related to post-op and IV fluids  - continue to trend and observe for any significant bleeding  Follow CBC , will differ to primary  5. Urinary retention/abnormal UA  - suspect picture of dehydration, being tx with IVF  - hematuria may be due to foley but would recommend f/u for this      Code Status: full Family Communication: family updated about patient's clinical  progress Disposition Plan:  As above     Consultants:  cardiology      HPI/Subjective: HR 145's , patient lightheaded  Objective: Filed Vitals:   06/26/14 2137 06/27/14 0129 06/27/14 0556 06/27/14 1028  BP: 120/57 140/90 119/86 129/81  Pulse: 103 110 98 115  Temp: 98.9 F (37.2 C) 99 F (37.2 C) 98.7 F (37.1 C) 98.1 F (36.7 C)  TempSrc: Oral Oral Oral Oral  Resp: 18 16 16 17   Height:      Weight:      SpO2: 100% 96% 100% 96%    Intake/Output Summary (Last 24 hours) at 06/27/14 1256 Last data filed at 06/27/14 1028  Gross per 24 hour  Intake    243 ml  Output   1475 ml  Net  -1232 ml    Exam:  General: alert & oriented x 3 In NAD  Cardiovascular: RRR, nl S1 s2  Respiratory: Decreased breath sounds at the bases, scattered rhonchi, no crackles  Abdomen: soft +BS NT/ND, no masses palpable  Extremities: No cyanosis and no edema      Data Reviewed: Basic Metabolic Panel:  Recent Labs Lab 06/20/14 1357 06/25/14 1547 06/25/14 2030  NA 141 136*  --   K 4.8 4.8  --   CL 102 100  --   CO2 26 27  --   GLUCOSE 109* 156*  --   BUN 14 15  --   CREATININE 0.89 1.09  --   CALCIUM 9.4 8.5  --   MG  --   --  1.8    Liver Function Tests: No results found for this basename: AST, ALT, ALKPHOS, BILITOT, PROT, ALBUMIN,  in the last 168 hours No results found for this basename: LIPASE, AMYLASE,  in the last 168 hours No results found for this basename: AMMONIA,  in the last 168 hours  CBC:  Recent Labs Lab 06/20/14 1357 06/25/14 1547  WBC 8.8 14.5*  HGB 15.5 11.7*  HCT 44.9 34.6*  MCV 87.5 87.6  PLT 221 179    Cardiac Enzymes: No results found for this basename: CKTOTAL, CKMB, CKMBINDEX, TROPONINI,  in the last 168 hours BNP (last 3 results) No results found for this basename: PROBNP,  in the last 8760 hours   CBG: No results found for this basename: GLUCAP,  in the last 168 hours  Recent Results (from the past 240 hour(s))  SURGICAL PCR  SCREEN     Status: None   Collection Time    06/20/14  1:44 PM      Result Value Ref Range Status   MRSA, PCR NEGATIVE  NEGATIVE Final   Staphylococcus aureus NEGATIVE  NEGATIVE Final   Comment:            The Xpert SA Assay (FDA     approved for NASAL specimens     in patients over 34 years of age),     is one component of     a comprehensive surveillance     program.  Test performance has     been validated by [employer] for patients greater     than or equal to 12 year old.     It is not intended     to diagnose infection nor to     guide or monitor treatment.     Studies: Dg Chest 2 View  06/20/2014   CLINICAL DATA:  Preop for lumbar surgery  EXAM: CHEST  2 VIEW  COMPARISON:  12/17/2009  FINDINGS: Cardiomediastinal silhouette is stable. No acute infiltrate or pleural effusion. No pulmonary edema. Degenerative changes thoracic spine.  IMPRESSION: No active cardiopulmonary disease.   Electronically Signed   By: Lahoma Crocker M.D.   On: 06/20/2014 14:39   Dg Lumbar Spine 2-3 Views  06/22/2014   CLINICAL DATA:  Post fusion  EXAM: LUMBAR SPINE - 2-3 VIEW; DG C-ARM 61-120 MIN  COMPARISON:  None  FINDINGS: Two views of the lumbar spine submitted. Pedicular screws are noted L4 and L5 vertebral body. Postsurgical intervertebral discs material noted L4-L5 level. There is disc space flattening with mild anterior spurring at L5-S1 level.  IMPRESSION: Postsurgical changes at L4-L5 level.  The alignment is preserved.  Fluoroscopy time was 34 seconds.   Electronically Signed   By: Lahoma Crocker M.D.   On: 06/22/2014 13:09   Ct Angio Chest Pe W/cm &/or Wo Cm  06/25/2014   CLINICAL DATA:  Hypoxia.  Recent lumbar spine surgery.  EXAM: CT ANGIOGRAPHY CHEST WITH CONTRAST  TECHNIQUE: Multidetector CT imaging of the chest was performed using the standard protocol during bolus administration of intravenous contrast. Multiplanar CT image reconstructions and MIPs were obtained to evaluate the vascular  anatomy.  CONTRAST:  62mL OMNIPAQUE IOHEXOL 350 MG/ML SOLN  COMPARISON:  Current chest radiograph  FINDINGS: There is no evidence of a pulmonary embolus. The thoracic aorta is normal in caliber with no evidence of dissection.  Heart is normal in size. No neck base, axillary, mediastinal or hilar masses or pathologically enlarged lymph nodes. Prominent  soft tissue along the hila is most likely due to sub cm lymph nodes.  Small, right greater left, pleural effusions. There is dependent subsegmental atelectasis. No lung consolidation or convincing pulmonary edema. No pneumothorax  Limited visualization of the upper abdomen is unremarkable.  There are degenerative changes throughout the visualized spine.  Review of the MIP images confirms the above findings.  IMPRESSION: 1. No evidence of a pulmonary embolus. 2. Small bilateral pleural effusions. Dependent subsegmental atelectasis. No convincing pneumonia or pulmonary edema.   Electronically Signed   By: Lajean Manes M.D.   On: 06/25/2014 19:56   Dg Lumbar Spine 1 View  06/22/2014   CLINICAL DATA:  L4-5 fusion.  EXAM: LUMBAR SPINE - 1 VIEW  COMPARISON:  MRI scan of June 13, 2014.  FINDINGS: Single lateral intraoperative view of the lumbar spine demonstrates surgical retractors in the soft tissues posterior to L3, L4 and L5. One surgical probe is seen directed toward the posterior portion of the L4 vertebral body, while another is directed toward the posterior portion of the L5 vertebral body. Degenerative disc disease is noted at L4-5 and L5-S1.  IMPRESSION: Surgical localization as described above.   Electronically Signed   By: Sabino Dick M.D.   On: 06/22/2014 13:00   Dg Chest Port 1 View  06/25/2014   CLINICAL DATA:  Syncope and shortness of breath  EXAM: PORTABLE CHEST - 1 VIEW  COMPARISON:  PA and lateral chest of June 20, 2014  FINDINGS: The lungs are adequately inflated. There is no focal infiltrate. The heart and pulmonary vascularity are normal.  There is no pleural effusion. There is degenerative disc change of the midcervical spine.  IMPRESSION: There is no acute cardiopulmonary abnormality.   Electronically Signed   By: David  Martinique   On: 06/25/2014 15:41   Dg C-arm 1-60 Min  06/22/2014   CLINICAL DATA:  Post fusion  EXAM: LUMBAR SPINE - 2-3 VIEW; DG C-ARM 61-120 MIN  COMPARISON:  None  FINDINGS: Two views of the lumbar spine submitted. Pedicular screws are noted L4 and L5 vertebral body. Postsurgical intervertebral discs material noted L4-L5 level. There is disc space flattening with mild anterior spurring at L5-S1 level.  IMPRESSION: Postsurgical changes at L4-L5 level.  The alignment is preserved.  Fluoroscopy time was 34 seconds.   Electronically Signed   By: Lahoma Crocker M.D.   On: 06/22/2014 13:09    Scheduled Meds: . sodium chloride   Intravenous Once  . bupivacaine liposome  20 mL Infiltration Once  . diltiazem  60 mg Oral 3 times per day  . docusate sodium  100 mg Oral BID  . famotidine  20 mg Oral BID  . sodium chloride  3 mL Intravenous Q12H  . tamsulosin  0.8 mg Oral Daily   Continuous Infusions: . sodium chloride 250 mL (06/27/14 1235)  . sodium chloride 100 mL/hr at 06/23/14 1856  . sodium chloride 50 mL/hr at 06/26/14 1402    Principal Problem:   Hypotension, unspecified Active Problems:   Spondylolisthesis of lumbar region   Syncope   Atrial fibrillation   Essential hypertension, benign   GERD (gastroesophageal reflux disease)    Time spent: 40 minutes   [facility] Pager [phone]. If 7PM-7AM, please contact night-coverage at [email or web address], password [facility] 06/27/2014, 12:56 PM  LOS: 5 days

## 2014-06-27 NOTE — Progress Notes (Signed)
Physical Therapy Treatment Patient Details Name: Adam Bennett MRN: [id number] DOB: [birth year] Today's Date: 06/27/2014    History of Present Illness 73 y.o. s/p L3 Laminectomy with L4-5 Diskectomy/cages/posterolateral arthrodesis/pedicle screws (N/A) - L3 Laminectomy with L4-5 Diskectomy/cages/posterolateral arthrodesis/pedicle screws    PT Comments    Pt with improved ability to complete bed mobility and tolerate ambulation however con't to require overall assist. Pt and wife concerned about pt's weakness due to wife not being able to physically assist pt at the minA level of care. May need to consider ST-SNF placement upon d/c to achieve safe mod I functional level for safe transition home. Pt's HR did stay below 120 t/o PT session however pt's pain increased to 12/10. Acute PT to con't to follow.   Follow Up Recommendations  Home health PT;Supervision/Assistance - 24 hour     Equipment Recommendations  Rolling walker with 5" wheels;3in1 (PT)    Recommendations for Other Services       Precautions / Restrictions Precautions Precautions: Back;Fall Precaution Booklet Issued: Yes (comment) Precaution Comments: Reviewed precautions Required Braces or Orthoses: Spinal Brace Spinal Brace: Lumbar corset;Applied in sitting position Restrictions Weight Bearing Restrictions: No    Mobility  Bed Mobility Overal bed mobility: Needs Assistance Bed Mobility: Rolling;Sidelying to Sit Rolling: Supervision Sidelying to sit: Min guard     Sit to sidelying: Min assist General bed mobility comments: pt with improved ability this date and required less time. pt with definite use of bed rail  Transfers Overall transfer level: Needs assistance Equipment used: Rolling walker (2 wheeled) Transfers: Sit to/from Stand Sit to Stand: Mod assist         General transfer comment: bed elevated, assist for initial anterior weight shift  Ambulation/Gait Ambulation/Gait assistance: Min  assist;Mod assist Ambulation Distance (Feet): 75 Feet Assistive device: Rolling walker (2 wheeled) Gait Pattern/deviations: Step-through pattern;Decreased stride length;Narrow base of support;Shuffle Gait velocity: slow   General Gait Details: pt with significant bilat UE WBing, pt hugging left side of walker with lean to the R.  Pt HR around 115. Pt with R knee instability toward end of walking   Stairs            Wheelchair Mobility    Modified Rankin (Stroke Patients Only)       Balance           Standing balance support: During functional activity Standing balance-Leahy Scale: Poor Standing balance comment: requires RW                    Cognition Arousal/Alertness: Awake/alert Behavior During Therapy: WFL for tasks assessed/performed Overall Cognitive Status: Within Functional Limits for tasks assessed                      Exercises      General Comments        Pertinent Vitals/Pain Pain Assessment: 0-10 Pain Score:  ("12" s/p ambulation) Pain Location: back and R LE Pain Intervention(s): Limited activity within patient's tolerance    Home Living                      Prior Function            PT Goals (current goals can now be found in the care plan section) Acute Rehab PT Goals Patient Stated Goal: get stronger Progress towards PT goals: Progressing toward goals    Frequency  Min 5X/week    PT Plan Current plan remains  appropriate    Co-evaluation             End of Session Equipment Utilized During Treatment: Gait belt;Back brace Activity Tolerance: Patient limited by fatigue;Patient limited by pain Patient left: in chair;with call bell/phone within reach;with family/visitor present;with chair alarm set     Time: 1450-1516 PT Time Calculation (min): 26 min  Charges:  $Gait Training: 23-37 mins                    G Codes:      Kingsley Callander 06/27/2014, 4:40 PM  Kittie Plater, PT,  DPT Pager #: [phone] Office #: [phone]

## 2014-06-28 LAB — GLUCOSE, CAPILLARY: GLUCOSE-CAPILLARY: 145 mg/dL — AB (ref 70–99)

## 2014-06-28 MED ORDER — MAGNESIUM CITRATE PO SOLN
1.0000 | Freq: Once | ORAL | Status: AC
  Administered 2014-06-28: 1 via ORAL
  Filled 2014-06-28: qty 296

## 2014-06-28 MED ORDER — ASPIRIN 81 MG PO CHEW
81.0000 mg | CHEWABLE_TABLET | Freq: Every day | ORAL | Status: DC
  Administered 2014-06-28 – 2014-06-29 (×2): 81 mg via ORAL
  Filled 2014-06-28 (×2): qty 1

## 2014-06-28 MED ORDER — DILTIAZEM HCL ER COATED BEADS 240 MG PO CP24
240.0000 mg | ORAL_CAPSULE | Freq: Every day | ORAL | Status: DC
  Administered 2014-06-28 – 2014-06-29 (×2): 240 mg via ORAL
  Filled 2014-06-28 (×2): qty 1

## 2014-06-28 MED ORDER — FLEET ENEMA 7-19 GM/118ML RE ENEM
1.0000 | ENEMA | Freq: Every day | RECTAL | Status: DC | PRN
  Administered 2014-06-28: 1 via RECTAL
  Filled 2014-06-28: qty 1

## 2014-06-28 NOTE — Clinical Social Work Placement (Signed)
Clinical Social Work Department CLINICAL SOCIAL WORK PLACEMENT NOTE 06/28/2014  Patient:  [name], [name]  Account Number:  [id number] Admit date:  06/22/2014  Clinical Social Worker:  Daiva Huge  Date/time:  06/28/2014 01:11 PM  Clinical Social Work is seeking post-discharge placement for this patient at the following level of care:   [place]   (*CSW will update this form in Epic as items are completed)   06/28/2014  Patient/family provided with [place] Department of Clinical Social Work's list of facilities offering this level of care within the geographic area requested by the patient (or if unable, by the patient's family).  06/28/2014  Patient/family informed of their freedom to choose among providers that offer the needed level of care, that participate in Medicare, Medicaid or managed care program needed by the patient, have an available bed and are willing to accept the patient.  06/28/2014  Patient/family informed of [facility]' ownership interest in [facility], as well as of the fact that they are under no obligation to receive care at this facility.  PASARR submitted to EDS on 06/28/2014 PASARR number received on 06/28/2014  FL2 transmitted to all facilities in geographic area requested by pt/family on  06/28/2014 FL2 transmitted to all facilities within larger geographic area on   Patient informed that his/her managed care company has contracts with or will negotiate with  certain facilities, including the following:     Patient/family informed of bed offers received:  06/28/2014 Patient chooses bed at Other Physician recommends and patient chooses bed at    Patient to be transferred to Other on  06/28/2014 Patient to be transferred to facility by ems Patient and family notified of transfer on 06/28/2014 Name of family member notified:  wife and patient at bedside  The following physician request were entered in Epic:   Additional  Comments:

## 2014-06-28 NOTE — Progress Notes (Signed)
Patient ID: Adam Bennett, male   DOB: [birth year], 73 y.o.   MRN: [id number] recvtal examination shos external hemorrhoids. Tone somewhat weak BUT present. Foley in place. i personally desimpacted him and gave a SS enema with 1500 cc of water. Was able to walk today with pt

## 2014-06-28 NOTE — Progress Notes (Signed)
Patient ID: Adam Bennett, male   DOB: 1941-05-30, 73 y.o.   MRN: [id number] No bm. Rectal tone present. I/o cath last night. No lbp. To SNF

## 2014-06-28 NOTE — Clinical Social Work Note (Signed)
SNF bed accepted at [facility] [place]-  Patient and wife pleased- awaiting medical clearance and dc orders/summary  Eduard Clos, MSW, [place]

## 2014-06-28 NOTE — Clinical Social Work Psychosocial (Signed)
Clinical Social Work Department BRIEF PSYCHOSOCIAL ASSESSMENT 06/28/2014  Patient:  XAINE, SANSOM     Account Number:  [id number]     Admit date:  06/22/2014  Clinical Social Worker:  Daiva Huge  Date/Time:  06/28/2014 01:02 PM  Referred by:  Physician  Date Referred:  06/28/2014 Referred for  SNF Placement   Other Referral:   Interview type:  Other - See comment Other interview type:   Met with wife and patient at bedside    PSYCHOSOCIAL DATA Living Status:  FAMILY Admitted from facility:   Level of care:   Primary support name:  wife Primary support relationship to patient:  FAMILY Degree of support available:   good    CURRENT CONCERNS Current Concerns  Post-Acute Placement   Other Concerns:    SOCIAL WORK ASSESSMENT / PLAN Met with patient and his wife at bedside this morning- they are interested in SNF placement at [facility] [place] which is near their home-  Dr. Joya Salm has indicated he is ready for dc today and I have confirmed a bed for him- awaiting MD for dc orders and summary   Assessment/plan status:  Other - See comment Other assessment/ plan:   FL2 and PASARR completed-   Information/referral to community resources:    PATIENT'S/FAMILY'S RESPONSE TO PLAN OF CARE: Wife appreciative of CSW assistance and is pleased that they can take her husband at [employer]- Patient in good spirits and jokingly plans to "drive the car" to the rehab facility-  Will await final dc papers from MD for d/c.   Eduard Clos, MSW, Latanya Presser [phone]

## 2014-06-28 NOTE — Progress Notes (Signed)
Physical Therapy Treatment Patient Details Name: Adam Bennett MRN: [id number] DOB: 10/05/1941 Today's Date: 06/28/2014    History of Present Illness 73 y.o. s/p L3 Laminectomy with L4-5 Diskectomy/cages/posterolateral arthrodesis/pedicle screws (N/A) - L3 Laminectomy with L4-5 Diskectomy/cages/posterolateral arthrodesis/pedicle screws    PT Comments    Pt slowly mobilizing. Pt very shaky due to generalized weakness and pain. Pt continues to be motivated. Planned for D/C to SNF tomorrow if he has BM.   Follow Up Recommendations  Supervision/Assistance - 24 hour;SNF     Equipment Recommendations  Rolling walker with 5" wheels;3in1 (PT)    Recommendations for Other Services       Precautions / Restrictions Precautions Precautions: Back;Fall Precaution Comments: pt able to recall 2/3 back precautions independently  Required Braces or Orthoses: Spinal Brace Spinal Brace: Lumbar corset;Applied in sitting position Restrictions Weight Bearing Restrictions: No    Mobility  Bed Mobility Overal bed mobility: Needs Assistance Bed Mobility: Rolling;Sidelying to Sit Rolling: Supervision Sidelying to sit: Min guard       General bed mobility comments: cues for log rolling technique and min facilitation to bring trunk to sitting position   Transfers Overall transfer level: Needs assistance Equipment used: Rolling walker (2 wheeled) Transfers: Sit to/from Stand Sit to Stand: Min assist;From elevated surface         General transfer comment: min (A) to achieve standing position; cues for safety and hand placement   Ambulation/Gait Ambulation/Gait assistance: Min assist Ambulation Distance (Feet): 100 Feet Assistive device: Rolling walker (2 wheeled) Gait Pattern/deviations: Step-through pattern;Decreased stride length;Narrow base of support;Shuffle Gait velocity: very slow and guarded Gait velocity interpretation: Below normal speed for age/gender General Gait Details: cues  for back precaution safety with directional changes; pt with bil LEs shaky due to weakness and fatigue; min (A) to balanec and manage RW    Stairs            Wheelchair Mobility    Modified Rankin (Stroke Patients Only)       Balance Overall balance assessment: Needs assistance Sitting-balance support: Feet supported;No upper extremity supported Sitting balance-Leahy Scale: Fair Sitting balance - Comments: guarded sitting EOB due to pain    Standing balance support: During functional activity;Bilateral upper extremity supported Standing balance-Leahy Scale: Poor Standing balance comment: requires RW for bil UE support                     Cognition Arousal/Alertness: Awake/alert Behavior During Therapy: WFL for tasks assessed/performed Overall Cognitive Status: Within Functional Limits for tasks assessed       Memory: Decreased recall of precautions              Exercises      General Comments General comments (skin integrity, edema, etc.): pt denied any dizziness       Pertinent Vitals/Pain Pain Assessment: 0-10 Pain Score: 5  Pain Location: surgical site and Rt LE Pain Descriptors / Indicators: Aching;Constant Pain Intervention(s): Premedicated before session;Repositioned    Home Living                      Prior Function            PT Goals (current goals can now be found in the care plan section) Acute Rehab PT Goals Patient Stated Goal: to go to rehab tomorrow after i can poop  PT Goal Formulation: With patient/family Time For Goal Achievement: 06/30/14 Potential to Achieve Goals: Good Progress towards PT goals: Progressing toward  goals    Frequency  Min 5X/week    PT Plan Discharge plan needs to be updated    Co-evaluation             End of Session Equipment Utilized During Treatment: Gait belt;Back brace Activity Tolerance: Patient tolerated treatment well Patient left: in chair;with call bell/phone within  reach;with family/visitor present     Time: 2197-5883 PT Time Calculation (min): 24 min  Charges:  [employer] Training: 23-37 mins                    G CodesElie Bennett Adam Bennett, Adam Bennett 06/28/2014, 5:03 PM

## 2014-06-28 NOTE — Clinical Social Work Note (Signed)
SNF transfer on hold until tomorrow per MD- advised the SNF rep and wife- will f/u tomorrow morning for ?dc.  Eduard Clos, MSW, [place]

## 2014-06-28 NOTE — Progress Notes (Addendum)
[facility] PROGRESS NOTE  Adam Bennett HEN:[id number] DOB: 1941-06-28 DOA: 06/22/2014 PCP: [name], MD  Assessment/Plan: Principal Problem:   Hypotension, unspecified Active Problems:   Spondylolisthesis of lumbar region   Syncope   Atrial fibrillation   Essential hypertension, benign   GERD (gastroesophageal reflux disease)   1. Lumbar radiculopathy with stenosis/spondylolisthesis s/p laminectomy/decompression 06/25/14  2. PAF, newly recognized  - Dr. Wynelle Cleveland spoke with primary cardiologist Dr. Kirkland Hun 9/1 "who is currently suspecting this episode was likely related to recent surgery and severe dehydration- he is recommeneding we continue ASA for now and hold off on further anticoagulation. He will see the patient in the office in a few wks and consider an event monitor." Needs close f/u with primary cardiologist - wife aware to call for appointment  - CHADSVASC = 2 for age, HTN  - if atrial fib persists beyond expected post-op healing course, primary cardiologist may have to revisit issue of anticoagulation  Started on long-acting diltiazem 240mg  daily (next dose due at 1400)  - he should f/u for sleep study as originally planned by primary cardiologist  Restart aspirin when okay with neurosurgery Ok to DC when cardiology clears him Will sign off   3. Hypertension with near-syncope when hypotensive 06/25/14  - would not resume amlodipine or lisinopril at this time since we are focusing on rate control    4. Mild anemia  - Hgb 15->11, possibly related to post-op and IV fluids, will need f/u as outpatient  - also has h/o internal hemorrhoids   5. Urinary retention/abnormal UA  - suspect picture of dehydration, being tx with IVF  - rx of urinary retention per primary team  - hematuria may be due to foley but would recommend f/u for this   HPI/Subjective: No CP or SOB. Having issues straining with BM this AM.  Per wife, they plan to send him to rehab  today.   Objective: Filed Vitals:   06/27/14 2130 06/28/14 0228 06/28/14 0614 06/28/14 1000  BP: 144/71 133/74 131/78 131/80  Pulse: 62 95 99 98  Temp: 98.8 F (37.1 C) 98.7 F (37.1 C) 99.2 F (37.3 C) 97.8 F (36.6 C)  TempSrc: Oral Oral Oral Oral  Resp: 18 20 20 20   Height:      Weight:      SpO2: 97% 96% 98% 96%    Intake/Output Summary (Last 24 hours) at 06/28/14 1210 Last data filed at 06/28/14 0920  Gross per 24 hour  Intake 12579.22 ml  Output   1730 ml  Net 10849.22 ml    Exam:  General: alert & oriented x 3 In NAD  Cardiovascular: RRR, nl S1 s2  Respiratory: Decreased breath sounds at the bases, scattered rhonchi, no crackles  Abdomen: soft +BS NT/ND, no masses palpable  Extremities: No cyanosis and no edema      Data Reviewed: Basic Metabolic Panel:  Recent Labs Lab 06/22/14 1155 06/25/14 1547 06/25/14 2030 06/27/14 1604  NA 135* 136*  --  137  K 4.7 4.8  --  4.4  CL  --  100  --  100  CO2  --  27  --  24  GLUCOSE 138* 156*  --  145*  BUN  --  15  --  12  CREATININE  --  1.09  --  0.75  CALCIUM  --  8.5  --  8.6  MG  --   --  1.8  --     Liver Function Tests: No results  found for this basename: AST, ALT, ALKPHOS, BILITOT, PROT, ALBUMIN,  in the last 168 hours No results found for this basename: LIPASE, AMYLASE,  in the last 168 hours No results found for this basename: AMMONIA,  in the last 168 hours  CBC:  Recent Labs Lab 06/22/14 1155 06/25/14 1547 06/27/14 1604  WBC  --  14.5* 7.5  HGB 11.9* 11.7* 11.3*  HCT 35.0* 34.6* 33.2*  MCV  --  87.6 89.2  PLT  --  179 299    Cardiac Enzymes: No results found for this basename: CKTOTAL, CKMB, CKMBINDEX, TROPONINI,  in the last 168 hours BNP (last 3 results) No results found for this basename: PROBNP,  in the last 8760 hours   CBG:  Recent Labs Lab 06/28/14 0756  GLUCAP 145*    Recent Results (from the past 240 hour(s))  SURGICAL PCR SCREEN     Status: None   Collection  Time    06/20/14  1:44 PM      Result Value Ref Range Status   MRSA, PCR NEGATIVE  NEGATIVE Final   Staphylococcus aureus NEGATIVE  NEGATIVE Final   Comment:            The Xpert SA Assay (FDA     approved for NASAL specimens     in patients over 22 years of age),     is one component of     a comprehensive surveillance     program.  Test performance has     been validated by [employer] for patients greater     than or equal to 20 year old.     It is not intended     to diagnose infection nor to     guide or monitor treatment.     Studies: Dg Chest 2 View  06/20/2014   CLINICAL DATA:  Preop for lumbar surgery  EXAM: CHEST  2 VIEW  COMPARISON:  12/17/2009  FINDINGS: Cardiomediastinal silhouette is stable. No acute infiltrate or pleural effusion. No pulmonary edema. Degenerative changes thoracic spine.  IMPRESSION: No active cardiopulmonary disease.   Electronically Signed   By: Lahoma Crocker M.D.   On: 06/20/2014 14:39   Dg Lumbar Spine 2-3 Views  06/22/2014   CLINICAL DATA:  Post fusion  EXAM: LUMBAR SPINE - 2-3 VIEW; DG C-ARM 61-120 MIN  COMPARISON:  None  FINDINGS: Two views of the lumbar spine submitted. Pedicular screws are noted L4 and L5 vertebral body. Postsurgical intervertebral discs material noted L4-L5 level. There is disc space flattening with mild anterior spurring at L5-S1 level.  IMPRESSION: Postsurgical changes at L4-L5 level.  The alignment is preserved.  Fluoroscopy time was 34 seconds.   Electronically Signed   By: Lahoma Crocker M.D.   On: 06/22/2014 13:09   Ct Angio Chest Pe W/cm &/or Wo Cm  06/25/2014   CLINICAL DATA:  Hypoxia.  Recent lumbar spine surgery.  EXAM: CT ANGIOGRAPHY CHEST WITH CONTRAST  TECHNIQUE: Multidetector CT imaging of the chest was performed using the standard protocol during bolus administration of intravenous contrast. Multiplanar CT image reconstructions and MIPs were obtained to evaluate the vascular anatomy.  CONTRAST:  58mL OMNIPAQUE IOHEXOL  350 MG/ML SOLN  COMPARISON:  Current chest radiograph  FINDINGS: There is no evidence of a pulmonary embolus. The thoracic aorta is normal in caliber with no evidence of dissection.  Heart is normal in size. No neck base, axillary, mediastinal or hilar masses or pathologically enlarged lymph  nodes. Prominent soft tissue along the hila is most likely due to sub cm lymph nodes.  Small, right greater left, pleural effusions. There is dependent subsegmental atelectasis. No lung consolidation or convincing pulmonary edema. No pneumothorax  Limited visualization of the upper abdomen is unremarkable.  There are degenerative changes throughout the visualized spine.  Review of the MIP images confirms the above findings.  IMPRESSION: 1. No evidence of a pulmonary embolus. 2. Small bilateral pleural effusions. Dependent subsegmental atelectasis. No convincing pneumonia or pulmonary edema.   Electronically Signed   By: Lajean Manes M.D.   On: 06/25/2014 19:56   Dg Lumbar Spine 1 View  06/22/2014   CLINICAL DATA:  L4-5 fusion.  EXAM: LUMBAR SPINE - 1 VIEW  COMPARISON:  MRI scan of June 13, 2014.  FINDINGS: Single lateral intraoperative view of the lumbar spine demonstrates surgical retractors in the soft tissues posterior to L3, L4 and L5. One surgical probe is seen directed toward the posterior portion of the L4 vertebral body, while another is directed toward the posterior portion of the L5 vertebral body. Degenerative disc disease is noted at L4-5 and L5-S1.  IMPRESSION: Surgical localization as described above.   Electronically Signed   By: Sabino Dick M.D.   On: 06/22/2014 13:00   Dg Chest Port 1 View  06/25/2014   CLINICAL DATA:  Syncope and shortness of breath  EXAM: PORTABLE CHEST - 1 VIEW  COMPARISON:  PA and lateral chest of June 20, 2014  FINDINGS: The lungs are adequately inflated. There is no focal infiltrate. The heart and pulmonary vascularity are normal. There is no pleural effusion. There is  degenerative disc change of the midcervical spine.  IMPRESSION: There is no acute cardiopulmonary abnormality.   Electronically Signed   By: David  Martinique   On: 06/25/2014 15:41   Dg C-arm 1-60 Min  06/22/2014   CLINICAL DATA:  Post fusion  EXAM: LUMBAR SPINE - 2-3 VIEW; DG C-ARM 61-120 MIN  COMPARISON:  None  FINDINGS: Two views of the lumbar spine submitted. Pedicular screws are noted L4 and L5 vertebral body. Postsurgical intervertebral discs material noted L4-L5 level. There is disc space flattening with mild anterior spurring at L5-S1 level.  IMPRESSION: Postsurgical changes at L4-L5 level.  The alignment is preserved.  Fluoroscopy time was 34 seconds.   Electronically Signed   By: Lahoma Crocker M.D.   On: 06/22/2014 13:09    Scheduled Meds: . sodium chloride   Intravenous Once  . aspirin  81 mg Oral Daily  . bupivacaine liposome  20 mL Infiltration Once  . diltiazem  240 mg Oral Daily  . docusate sodium  100 mg Oral BID  . famotidine  20 mg Oral BID  . sodium chloride  3 mL Intravenous Q12H  . tamsulosin  0.8 mg Oral Daily   Continuous Infusions: . sodium chloride 250 mL (06/27/14 1235)  . sodium chloride 100 mL/hr at 06/23/14 1856  . sodium chloride 50 mL/hr at 06/26/14 1402    Principal Problem:   Hypotension, unspecified Active Problems:   Spondylolisthesis of lumbar region   Syncope   Atrial fibrillation   Essential hypertension, benign   GERD (gastroesophageal reflux disease)    Time spent: 40 minutes   [facility] Pager [phone]. If 7PM-7AM, please contact night-coverage at [email or web address], password [facility] 06/28/2014, 12:10 PM  LOS: 6 days

## 2014-06-28 NOTE — Progress Notes (Signed)
Patient: Adam Bennett / Admit Date: 06/22/2014 / Date of Encounter: 06/28/2014, 11:27 AM   Subjective: No CP or SOB. Having issues straining with BM this AM. Per wife, they plan to send him to rehab today.   Objective: Telemetry: paroxysms of NSR and atrial fib, rates better controlled but still upper 90s-low 100s at times Physical Exam: Blood pressure 131/80, pulse 98, temperature 97.8 F (36.6 C), temperature source Oral, resp. rate 20, height 6\' 3"  (1.905 m), weight 211 lb (95.709 kg), SpO2 96.00%. General: Well developed, well nourished WM, in no acute distress.  Head: Normocephalic, atraumatic, sclera non-icteric, no xanthomas, nares are without discharge.  Neck: Negative for carotid bruits. JVD not elevated.  Lungs: Clear bilaterally to auscultation without wheezes, rales, or rhonchi. Breathing is unlabored.  Heart: Irregularly irregular with S1 S2. No murmurs, rubs, or gallops appreciated.  Abdomen: Soft, non-tender, non-distended with normoactive bowel sounds. No hepatomegaly. No rebound/guarding. No obvious abdominal masses.  Msk: Strength and tone appear normal for age.  Extremities: No clubbing or cyanosis. No edema. Distal pedal pulses are 2+ and equal bilaterally.  Neuro: Alert and oriented X 3. No facial asymmetry. No focal deficit. Moves all extremities spontaneously.  Psych: Responds to questions appropriately with a normal affect.    Intake/Output Summary (Last 24 hours) at 06/28/14 1127 Last data filed at 06/28/14 0920  Gross per 24 hour  Intake 12579.22 ml  Output   1730 ml  Net 10849.22 ml    Inpatient Medications:  . sodium chloride   Intravenous Once  . aspirin  81 mg Oral Daily  . bupivacaine liposome  20 mL Infiltration Once  . diltiazem  60 mg Oral 3 times per day  . docusate sodium  100 mg Oral BID  . famotidine  20 mg Oral BID  . sodium chloride  3 mL Intravenous Q12H  . tamsulosin  0.8 mg Oral Daily   Infusions:  . sodium chloride 250 mL (06/27/14  1235)  . sodium chloride 100 mL/hr at 06/23/14 1856  . sodium chloride 50 mL/hr at 06/26/14 1402    Labs:  Recent Labs  06/25/14 1547 06/25/14 2030 06/27/14 1604  NA 136*  --  137  K 4.8  --  4.4  CL 100  --  100  CO2 27  --  24  GLUCOSE 156*  --  145*  BUN 15  --  12  CREATININE 1.09  --  0.75  CALCIUM 8.5  --  8.6  MG  --  1.8  --     Recent Labs  06/25/14 1547 06/27/14 1604  WBC 14.5* 7.5  HGB 11.7* 11.3*  HCT 34.6* 33.2*  MCV 87.6 89.2  PLT 179 299    Recent Labs  06/27/14 1252  HGBA1C 6.1*     Radiology/Studies:  Dg Chest 2 View  06/20/2014   CLINICAL DATA:  Preop for lumbar surgery  EXAM: CHEST  2 VIEW  COMPARISON:  12/17/2009  FINDINGS: Cardiomediastinal silhouette is stable. No acute infiltrate or pleural effusion. No pulmonary edema. Degenerative changes thoracic spine.  IMPRESSION: No active cardiopulmonary disease.   Electronically Signed   By: Lahoma Crocker M.D.   On: 06/20/2014 14:39   Dg Lumbar Spine 2-3 Views  06/22/2014   CLINICAL DATA:  Post fusion  EXAM: LUMBAR SPINE - 2-3 VIEW; DG C-ARM 61-120 MIN  COMPARISON:  None  FINDINGS: Two views of the lumbar spine submitted. Pedicular screws are noted L4 and L5 vertebral body. Postsurgical intervertebral  discs material noted L4-L5 level. There is disc space flattening with mild anterior spurring at L5-S1 level.  IMPRESSION: Postsurgical changes at L4-L5 level.  The alignment is preserved.  Fluoroscopy time was 34 seconds.   Electronically Signed   By: Lahoma Crocker M.D.   On: 06/22/2014 13:09   Ct Angio Chest Pe W/cm &/or Wo Cm  06/25/2014   CLINICAL DATA:  Hypoxia.  Recent lumbar spine surgery.  EXAM: CT ANGIOGRAPHY CHEST WITH CONTRAST  TECHNIQUE: Multidetector CT imaging of the chest was performed using the standard protocol during bolus administration of intravenous contrast. Multiplanar CT image reconstructions and MIPs were obtained to evaluate the vascular anatomy.  CONTRAST:  88mL OMNIPAQUE IOHEXOL 350  MG/ML SOLN  COMPARISON:  Current chest radiograph  FINDINGS: There is no evidence of a pulmonary embolus. The thoracic aorta is normal in caliber with no evidence of dissection.  Heart is normal in size. No neck base, axillary, mediastinal or hilar masses or pathologically enlarged lymph nodes. Prominent soft tissue along the hila is most likely due to sub cm lymph nodes.  Small, right greater left, pleural effusions. There is dependent subsegmental atelectasis. No lung consolidation or convincing pulmonary edema. No pneumothorax  Limited visualization of the upper abdomen is unremarkable.  There are degenerative changes throughout the visualized spine.  Review of the MIP images confirms the above findings.  IMPRESSION: 1. No evidence of a pulmonary embolus. 2. Small bilateral pleural effusions. Dependent subsegmental atelectasis. No convincing pneumonia or pulmonary edema.   Electronically Signed   By: Lajean Manes M.D.   On: 06/25/2014 19:56   Dg Lumbar Spine 1 View  06/22/2014   CLINICAL DATA:  L4-5 fusion.  EXAM: LUMBAR SPINE - 1 VIEW  COMPARISON:  MRI scan of June 13, 2014.  FINDINGS: Single lateral intraoperative view of the lumbar spine demonstrates surgical retractors in the soft tissues posterior to L3, L4 and L5. One surgical probe is seen directed toward the posterior portion of the L4 vertebral body, while another is directed toward the posterior portion of the L5 vertebral body. Degenerative disc disease is noted at L4-5 and L5-S1.  IMPRESSION: Surgical localization as described above.   Electronically Signed   By: Sabino Dick M.D.   On: 06/22/2014 13:00   Dg Chest Port 1 View  06/25/2014   CLINICAL DATA:  Syncope and shortness of breath  EXAM: PORTABLE CHEST - 1 VIEW  COMPARISON:  PA and lateral chest of June 20, 2014  FINDINGS: The lungs are adequately inflated. There is no focal infiltrate. The heart and pulmonary vascularity are normal. There is no pleural effusion. There is degenerative  disc change of the midcervical spine.  IMPRESSION: There is no acute cardiopulmonary abnormality.   Electronically Signed   By: David  Martinique   On: 06/25/2014 15:41   Dg C-arm 1-60 Min  06/22/2014   CLINICAL DATA:  Post fusion  EXAM: LUMBAR SPINE - 2-3 VIEW; DG C-ARM 61-120 MIN  COMPARISON:  None  FINDINGS: Two views of the lumbar spine submitted. Pedicular screws are noted L4 and L5 vertebral body. Postsurgical intervertebral discs material noted L4-L5 level. There is disc space flattening with mild anterior spurring at L5-S1 level.  IMPRESSION: Postsurgical changes at L4-L5 level.  The alignment is preserved.  Fluoroscopy time was 34 seconds.   Electronically Signed   By: Lahoma Crocker M.D.   On: 06/22/2014 13:09     Assessment and Plan  73 y/o M with h/o HTN, GERD, sinus  problems and no prior cardiac hx was admitted 8/29 for planned lumbar surgery. On 8/31 had near-fainting episode with hypotension BP 76/41, was placed on telemetry shortly later that day and noted to be in atrial fib with RVR. Brief conversion 06/26/14 then back to AF. Onset not completely clear as HR's on 8/29 & 8/30 were also in low 100s at times; pulse at time of near-fainting episode was documented as 68. CTA for PE and TSH wnl.   1. Lumbar radiculopathy with stenosis/spondylolisthesis s/p laminectomy/decompression 06/25/14  2. PAF, newly recognized  - Dr. Wynelle Cleveland spoke with primary cardiologist Dr. Kirkland Hun 9/1 "who is currently suspecting this episode was likely related to recent surgery and severe dehydration- he is recommeneding we continue ASA for now and hold off on further anticoagulation. He will see the patient in the office in a few wks and consider an event monitor." Needs close f/u with primary cardiologist - wife aware to call for appointment - CHADSVASC = 2 for age, HTN - if atrial fib persists beyond expected post-op healing course, primary cardiologist may have to revisit issue of anticoagulation  - with hypotension  resolved and HR's still a little higher, will consolidate to slightly higher total dose of long-acting diltiazem 240mg  daily (next dose due at 1400) - he should f/u for sleep study as originally planned by primary cardiologist  3. Hypertension with near-syncope when hypotensive 06/25/14  - would not resume amlodipine or lisinopril at this time since we are focusing on rate control 4. Mild anemia  - Hgb 15->11, possibly related to post-op and IV fluids, will need f/u as outpatient - also has h/o internal hemorrhoids 5. Urinary retention/abnormal UA  - suspect picture of dehydration, being tx with IVF  - rx of urinary retention per primary team - hematuria may be due to foley but would recommend f/u for this   Signed, Melina Copa PA-C   Patient seen and examined. Agree with assessment and plan. Telemetry now shows NSR at 95. Agree with medical regimen changes.   Troy Sine, MD, [facility] 06/28/2014 2:54 PM

## 2014-06-29 NOTE — Discharge Summary (Signed)
Physician Discharge Summary  Patient ID: ALEKSI BRUMMET MRN: [id number] DOB/AGE: 1941-03-08 73 y.o.  Admit date: 06/22/2014 Discharge date: 06/29/2014  Admission Diagnoses:lumbar spondylolisthesis, stenosis  Discharge Diagnoses:  Principal Problem:   Hypotension, unspecified Active Problems:   Spondylolisthesis of lumbar region   Syncope   Atrial fibrillation   Essential hypertension, benign   GERD (gastroesophageal reflux disease) urinary retention  Discharged Condition:stable. Foley in place  Hospital Course: surgery  Consults: rehabilitation medicine  Significant Diagnostic Studies:mri  Treatments: lumbar decompression and fusion  Discharge Exam: Blood pressure 110/61, pulse 96, temperature 98.5 F (36.9 C), temperature source Oral, resp. rate 20, height 6\' 3"  (1.905 m), weight 95.709 kg (211 lb), SpO2 98.00%. ffoley in place. Ambulating with a walker  Disposition: SNF     Medication List    ASK your doctor about these medications       ALLERGY PO  Take 1 tablet by mouth daily as needed (allergies).     amLODipine 10 MG tablet  Commonly known as:  NORVASC  Take 10 mg by mouth daily.     aspirin EC 81 MG tablet  Take 81 mg by mouth daily.     lisinopril 5 MG tablet  Commonly known as:  PRINIVIL,ZESTRIL  Take 5 mg by mouth daily.     multivitamin with minerals Tabs tablet  Take 1 tablet by mouth daily.     naproxen sodium 220 MG tablet  Commonly known as:  ANAPROX  Take 220 mg by mouth 2 (two) times daily with a meal.     omeprazole 20 MG capsule  Commonly known as:  PRILOSEC  Take 20 mg by mouth 2 (two) times daily before a meal.     VISINE 0.05 % ophthalmic solution  Generic drug:  tetrahydrozoline  Place 1 drop into both eyes daily as needed (dry eyes).         Signed: Floyce Stakes 06/29/2014, 2:20 PM

## 2014-06-29 NOTE — Progress Notes (Signed)
Pt OOB to bathroom. Large bowel movement noted. Pt states he feels much better at this time. Will continue to monitor. Verdie Drown RN BSN

## 2014-06-29 NOTE — Progress Notes (Signed)
Pt ambulated in the hallway appx 100 ft 2 separate times today with RN, using brace and walker.  Pt tolerated ambulation well, slightly unsteady gait both times.  Pt has also ambulated to the bathroom several times today.

## 2014-06-29 NOTE — Progress Notes (Signed)
UR completed 

## 2014-06-29 NOTE — Progress Notes (Signed)
Discharge orders received, pt for discharge today to [facility] SNF.  IV and telemetry D/C.  D/C instructions and Rx in packet for receiving facility and report called to SNF.  Family at the bedside to assist with discharge. Staff brought pt downstairs via stretcher.

## 2014-06-29 NOTE — Progress Notes (Signed)
Patient ID: Adam Bennett, male   DOB: 11/25/1940, 73 y.o.   MRN: [id number] Ambulating, had 2 bowel movements on his own. Foley in place. Wound dry. Discharge today or over the weekend.

## 2014-06-29 NOTE — Clinical Social Work Placement (Deleted)
Clinical Social Work Department CLINICAL SOCIAL WORK PLACEMENT NOTE 06/29/2014  Patient:  Adam Bennett, Adam Bennett  Account Number:  [id number] Admit date:  06/27/2014  Clinical Social Worker:  Daiva Huge  Date/time:  06/29/2014 11:13 AM  Clinical Social Work is seeking post-discharge placement for this patient at the following level of care:   SKILLED NURSING   (*CSW will update this form in Epic as items are completed)   06/29/2014  Patient/family provided with [facility] Department of Clinical Social Work's list of facilities offering this level of care within the geographic area requested by the patient (or if unable, by the patient's family).  06/29/2014  Patient/family informed of their freedom to choose among providers that offer the needed level of care, that participate in Medicare, Medicaid or managed care program needed by the patient, have an available bed and are willing to accept the patient.  06/29/2014  Patient/family informed of [facility]' ownership interest in [facility], as well as of the fact that they are under no obligation to receive care at this facility.  PASARR submitted to EDS on 06/29/2014 PASARR number received on 06/29/2014  FL2 transmitted to all facilities in geographic area requested by pt/family on  06/29/2014 FL2 transmitted to all facilities within larger geographic area on   Patient informed that his/her managed care company has contracts with or will negotiate with  certain facilities, including the following:     Patient/family informed of bed offers received:   Patient chooses bed at  Physician recommends and patient chooses bed at    Patient to be transferred to  on   Patient to be transferred to facility by  Patient and family notified of transfer on  Name of family member notified:    The following physician request were entered in Epic:   Additional Comments: Eduard Clos, MSW, [place]

## 2014-06-29 NOTE — Progress Notes (Signed)
Physical Therapy Treatment Patient Details Name: Adam Bennett MRN: [id number] DOB: [birth year] Today's Date: 06/29/2014    History of Present Illness 73 y.o. s/p L3 Laminectomy with L4-5 Diskectomy/cages/posterolateral arthrodesis/pedicle screws (N/A) - L3 Laminectomy with L4-5 Diskectomy/cages/posterolateral arthrodesis/pedicle screws    PT Comments    Pt progressing slowly. Pt con't to require significant time for all transfers and ambulation. Cont to benefit from SNF upon d/c to achieve safe supervision level of care for safe transition home with spouse.   Follow Up Recommendations  SNF;Supervision/Assistance - 24 hour     Equipment Recommendations  Rolling walker with 5" wheels;3in1 (PT)    Recommendations for Other Services       Precautions / Restrictions Precautions Precautions: Back;Fall Precaution Booklet Issued: Yes (comment) Precaution Comments: pt able to recall 2/3 back precautions independently  Required Braces or Orthoses: Spinal Brace Spinal Brace: Lumbar corset;Applied in sitting position Restrictions Weight Bearing Restrictions: No    Mobility  Bed Mobility Overal bed mobility: Needs Assistance Bed Mobility: Rolling;Sidelying to Sit;Sit to Sidelying Rolling: Supervision Sidelying to sit: Min guard     Sit to sidelying: Min guard General bed mobility comments: directional v/c's for safe technique/adherence to back precautions.  Transfers Overall transfer level: Needs assistance Equipment used: Rolling walker (2 wheeled) Transfers: Sit to/from Stand Sit to Stand: Min assist;From elevated surface         General transfer comment: increased time, v/c's for safe hand placement  Ambulation/Gait Ambulation/Gait assistance: Min guard Ambulation Distance (Feet): 120 Feet Assistive device: Rolling walker (2 wheeled) Gait Pattern/deviations: Step-through pattern;Decreased stride length;Decreased weight shift to right Gait velocity: very slow and  guarded Gait velocity interpretation: Below normal speed for age/gender General Gait Details: v/c's to maintain upright position/minimize trunk flexion, con't to have significant UE WBing despite v/c's to increase WBing thru LEs   Stairs            Wheelchair Mobility    Modified Rankin (Stroke Patients Only)       Balance                                    Cognition Arousal/Alertness: Awake/alert Behavior During Therapy: WFL for tasks assessed/performed Overall Cognitive Status: Within Functional Limits for tasks assessed                      Exercises      General Comments        Pertinent Vitals/Pain Pain Assessment: 0-10 Pain Score: 5  Pain Location: R hip Pain Descriptors / Indicators: Aching;Constant Pain Intervention(s): Monitored during session    Home Living                      Prior Function            PT Goals (current goals can now be found in the care plan section) Progress towards PT goals: Progressing toward goals    Frequency  Min 5X/week    PT Plan Current plan remains appropriate    Co-evaluation             End of Session Equipment Utilized During Treatment: Gait belt;Back brace Activity Tolerance: Patient tolerated treatment well Patient left: in bed;with call bell/phone within reach     Time: 1430-1448 PT Time Calculation (min): 18 min  Charges:  $Gait Training: 8-22 mins  G CodesKingsley Callander 06/29/2014, 3:34 PM  Kittie Plater, PT, DPT Pager #: [phone] Office #: [phone]

## 2014-06-29 NOTE — Clinical Social Work Psychosocial (Deleted)
Clinical Social Work Department BRIEF PSYCHOSOCIAL ASSESSMENT 06/29/2014  Patient:  Adam Bennett, Adam Bennett     Account Number:  [id number]     Admit date:  06/27/2014  Clinical Social Worker:  Daiva Huge  Date/Time:  06/29/2014 11:07 AM  Referred by:  Physician  Date Referred:  06/28/2014 Referred for  SNF Placement   Other Referral:   Interview type:  Patient Other interview type:    PSYCHOSOCIAL DATA Living Status:  ALONE Admitted from facility:   Level of care:   Primary support name:  2 nieces Primary support relationship to patient:  FAMILY Degree of support available:   good    CURRENT CONCERNS Current Concerns  Post-Acute Placement   Other Concerns:    SOCIAL WORK ASSESSMENT / PLAN Met with patient who was very pleasant and shared his story of having attmepted cardioversion inpast that was unsuccessful and led to this admission and procedure- he is well versed on his medical diagnosis and treatments and is hopeful this will now be resolved.  He has already initiiated a SNF stay at [facility] prior to admission and is requesting CSW proceed with this plan-   Assessment/plan status:  Other - See comment Other assessment/ plan:   FL2 and PASARR for SNF   Information/referral to community resources:   SNF list    PATIENT'S/FAMILY'S RESPONSE TO PLAN OF CARE: Patient is appreciative of CSW assitance and agreeable to plans as above-    Eduard Clos, MSW, [place]

## 2014-06-29 NOTE — Progress Notes (Signed)
Pt received hydrocodone 10 mL at 2000. In pyxis, wasted 7.55ml, although when ordered verified before administration, it was noted that patient was to receive 10 mL. 35mL of liquid hydrocodone wasted in patients room sink. Pharmacy aware of waste type error in pyxis. Verdie Drown RN BSN

## 2014-11-01 DIAGNOSIS — M4310 Spondylolisthesis, site unspecified: Secondary | ICD-10-CM | POA: Diagnosis not present

## 2014-11-15 DIAGNOSIS — G4733 Obstructive sleep apnea (adult) (pediatric): Secondary | ICD-10-CM | POA: Diagnosis not present

## 2014-12-07 DIAGNOSIS — I472 Ventricular tachycardia: Secondary | ICD-10-CM | POA: Diagnosis not present

## 2014-12-11 DIAGNOSIS — K219 Gastro-esophageal reflux disease without esophagitis: Secondary | ICD-10-CM | POA: Diagnosis not present

## 2014-12-11 DIAGNOSIS — K5900 Constipation, unspecified: Secondary | ICD-10-CM | POA: Diagnosis not present

## 2014-12-13 DIAGNOSIS — I10 Essential (primary) hypertension: Secondary | ICD-10-CM | POA: Diagnosis not present

## 2014-12-13 DIAGNOSIS — I480 Paroxysmal atrial fibrillation: Secondary | ICD-10-CM | POA: Diagnosis not present

## 2014-12-14 DIAGNOSIS — G4733 Obstructive sleep apnea (adult) (pediatric): Secondary | ICD-10-CM | POA: Diagnosis not present

## 2014-12-16 DIAGNOSIS — G4733 Obstructive sleep apnea (adult) (pediatric): Secondary | ICD-10-CM | POA: Diagnosis not present

## 2014-12-19 DIAGNOSIS — Z800 Family history of malignant neoplasm of digestive organs: Secondary | ICD-10-CM | POA: Diagnosis not present

## 2014-12-19 DIAGNOSIS — K5900 Constipation, unspecified: Secondary | ICD-10-CM | POA: Diagnosis not present

## 2014-12-19 DIAGNOSIS — K2900 Acute gastritis without bleeding: Secondary | ICD-10-CM | POA: Diagnosis not present

## 2014-12-19 DIAGNOSIS — Z9049 Acquired absence of other specified parts of digestive tract: Secondary | ICD-10-CM | POA: Diagnosis not present

## 2014-12-19 DIAGNOSIS — G4730 Sleep apnea, unspecified: Secondary | ICD-10-CM | POA: Diagnosis not present

## 2014-12-19 DIAGNOSIS — K2950 Unspecified chronic gastritis without bleeding: Secondary | ICD-10-CM | POA: Diagnosis not present

## 2014-12-19 DIAGNOSIS — K449 Diaphragmatic hernia without obstruction or gangrene: Secondary | ICD-10-CM | POA: Diagnosis not present

## 2014-12-19 DIAGNOSIS — D131 Benign neoplasm of stomach: Secondary | ICD-10-CM | POA: Diagnosis not present

## 2014-12-19 DIAGNOSIS — K317 Polyp of stomach and duodenum: Secondary | ICD-10-CM | POA: Diagnosis not present

## 2014-12-19 DIAGNOSIS — I4891 Unspecified atrial fibrillation: Secondary | ICD-10-CM | POA: Diagnosis not present

## 2014-12-19 DIAGNOSIS — M109 Gout, unspecified: Secondary | ICD-10-CM | POA: Diagnosis not present

## 2014-12-19 DIAGNOSIS — Z86010 Personal history of colonic polyps: Secondary | ICD-10-CM | POA: Diagnosis not present

## 2014-12-19 DIAGNOSIS — K319 Disease of stomach and duodenum, unspecified: Secondary | ICD-10-CM | POA: Diagnosis not present

## 2014-12-19 DIAGNOSIS — K219 Gastro-esophageal reflux disease without esophagitis: Secondary | ICD-10-CM | POA: Diagnosis not present

## 2014-12-26 DIAGNOSIS — M4317 Spondylolisthesis, lumbosacral region: Secondary | ICD-10-CM | POA: Diagnosis not present

## 2014-12-26 DIAGNOSIS — M545 Low back pain: Secondary | ICD-10-CM | POA: Diagnosis not present

## 2014-12-31 DIAGNOSIS — M4317 Spondylolisthesis, lumbosacral region: Secondary | ICD-10-CM | POA: Diagnosis not present

## 2014-12-31 DIAGNOSIS — M545 Low back pain: Secondary | ICD-10-CM | POA: Diagnosis not present

## 2015-01-02 DIAGNOSIS — M545 Low back pain: Secondary | ICD-10-CM | POA: Diagnosis not present

## 2015-01-02 DIAGNOSIS — M4317 Spondylolisthesis, lumbosacral region: Secondary | ICD-10-CM | POA: Diagnosis not present

## 2015-01-08 DIAGNOSIS — M4317 Spondylolisthesis, lumbosacral region: Secondary | ICD-10-CM | POA: Diagnosis not present

## 2015-01-08 DIAGNOSIS — M545 Low back pain: Secondary | ICD-10-CM | POA: Diagnosis not present

## 2015-01-10 DIAGNOSIS — M4317 Spondylolisthesis, lumbosacral region: Secondary | ICD-10-CM | POA: Diagnosis not present

## 2015-01-10 DIAGNOSIS — M545 Low back pain: Secondary | ICD-10-CM | POA: Diagnosis not present

## 2015-01-11 DIAGNOSIS — G4733 Obstructive sleep apnea (adult) (pediatric): Secondary | ICD-10-CM | POA: Diagnosis not present

## 2015-01-14 DIAGNOSIS — G4733 Obstructive sleep apnea (adult) (pediatric): Secondary | ICD-10-CM | POA: Diagnosis not present

## 2015-01-15 DIAGNOSIS — M4317 Spondylolisthesis, lumbosacral region: Secondary | ICD-10-CM | POA: Diagnosis not present

## 2015-01-15 DIAGNOSIS — M545 Low back pain: Secondary | ICD-10-CM | POA: Diagnosis not present

## 2015-01-22 DIAGNOSIS — M545 Low back pain: Secondary | ICD-10-CM | POA: Diagnosis not present

## 2015-01-22 DIAGNOSIS — M4317 Spondylolisthesis, lumbosacral region: Secondary | ICD-10-CM | POA: Diagnosis not present

## 2015-01-24 DIAGNOSIS — M545 Low back pain: Secondary | ICD-10-CM | POA: Diagnosis not present

## 2015-01-24 DIAGNOSIS — M4317 Spondylolisthesis, lumbosacral region: Secondary | ICD-10-CM | POA: Diagnosis not present

## 2015-01-29 DIAGNOSIS — M545 Low back pain: Secondary | ICD-10-CM | POA: Diagnosis not present

## 2015-01-29 DIAGNOSIS — M4317 Spondylolisthesis, lumbosacral region: Secondary | ICD-10-CM | POA: Diagnosis not present

## 2015-01-31 DIAGNOSIS — M545 Low back pain: Secondary | ICD-10-CM | POA: Diagnosis not present

## 2015-01-31 DIAGNOSIS — M4317 Spondylolisthesis, lumbosacral region: Secondary | ICD-10-CM | POA: Diagnosis not present

## 2015-02-05 DIAGNOSIS — M4317 Spondylolisthesis, lumbosacral region: Secondary | ICD-10-CM | POA: Diagnosis not present

## 2015-02-05 DIAGNOSIS — M545 Low back pain: Secondary | ICD-10-CM | POA: Diagnosis not present

## 2015-02-07 DIAGNOSIS — M545 Low back pain: Secondary | ICD-10-CM | POA: Diagnosis not present

## 2015-02-07 DIAGNOSIS — M4317 Spondylolisthesis, lumbosacral region: Secondary | ICD-10-CM | POA: Diagnosis not present

## 2015-02-12 DIAGNOSIS — M4317 Spondylolisthesis, lumbosacral region: Secondary | ICD-10-CM | POA: Diagnosis not present

## 2015-02-12 DIAGNOSIS — M545 Low back pain: Secondary | ICD-10-CM | POA: Diagnosis not present

## 2015-02-14 DIAGNOSIS — G4733 Obstructive sleep apnea (adult) (pediatric): Secondary | ICD-10-CM | POA: Diagnosis not present

## 2015-02-21 DIAGNOSIS — M4317 Spondylolisthesis, lumbosacral region: Secondary | ICD-10-CM | POA: Diagnosis not present

## 2015-02-21 DIAGNOSIS — M545 Low back pain: Secondary | ICD-10-CM | POA: Diagnosis not present

## 2015-02-26 DIAGNOSIS — M4317 Spondylolisthesis, lumbosacral region: Secondary | ICD-10-CM | POA: Diagnosis not present

## 2015-02-26 DIAGNOSIS — M545 Low back pain: Secondary | ICD-10-CM | POA: Diagnosis not present

## 2015-03-07 DIAGNOSIS — M4310 Spondylolisthesis, site unspecified: Secondary | ICD-10-CM | POA: Diagnosis not present

## 2015-03-16 DIAGNOSIS — G4733 Obstructive sleep apnea (adult) (pediatric): Secondary | ICD-10-CM | POA: Diagnosis not present

## 2015-03-18 DIAGNOSIS — D1724 Benign lipomatous neoplasm of skin and subcutaneous tissue of left leg: Secondary | ICD-10-CM | POA: Diagnosis not present

## 2015-03-18 DIAGNOSIS — Z6827 Body mass index (BMI) 27.0-27.9, adult: Secondary | ICD-10-CM | POA: Diagnosis not present

## 2015-04-16 DIAGNOSIS — G4733 Obstructive sleep apnea (adult) (pediatric): Secondary | ICD-10-CM | POA: Diagnosis not present

## 2015-04-19 DIAGNOSIS — G4733 Obstructive sleep apnea (adult) (pediatric): Secondary | ICD-10-CM | POA: Diagnosis not present

## 2015-05-07 DIAGNOSIS — L578 Other skin changes due to chronic exposure to nonionizing radiation: Secondary | ICD-10-CM | POA: Diagnosis not present

## 2015-05-07 DIAGNOSIS — K625 Hemorrhage of anus and rectum: Secondary | ICD-10-CM | POA: Diagnosis not present

## 2015-05-07 DIAGNOSIS — K5900 Constipation, unspecified: Secondary | ICD-10-CM | POA: Diagnosis not present

## 2015-05-07 DIAGNOSIS — C44319 Basal cell carcinoma of skin of other parts of face: Secondary | ICD-10-CM | POA: Diagnosis not present

## 2015-05-08 DIAGNOSIS — K648 Other hemorrhoids: Secondary | ICD-10-CM | POA: Diagnosis not present

## 2015-05-08 DIAGNOSIS — I10 Essential (primary) hypertension: Secondary | ICD-10-CM | POA: Insufficient documentation

## 2015-05-08 DIAGNOSIS — G4733 Obstructive sleep apnea (adult) (pediatric): Secondary | ICD-10-CM

## 2015-05-08 DIAGNOSIS — I272 Pulmonary hypertension, unspecified: Secondary | ICD-10-CM | POA: Insufficient documentation

## 2015-05-08 HISTORY — DX: Obstructive sleep apnea (adult) (pediatric): G47.33

## 2015-05-08 HISTORY — DX: Pulmonary hypertension, unspecified: I27.20

## 2015-05-09 DIAGNOSIS — Z01810 Encounter for preprocedural cardiovascular examination: Secondary | ICD-10-CM | POA: Diagnosis not present

## 2015-05-09 DIAGNOSIS — I482 Chronic atrial fibrillation: Secondary | ICD-10-CM | POA: Diagnosis not present

## 2015-05-09 DIAGNOSIS — Z01818 Encounter for other preprocedural examination: Secondary | ICD-10-CM | POA: Diagnosis not present

## 2015-05-09 DIAGNOSIS — G4733 Obstructive sleep apnea (adult) (pediatric): Secondary | ICD-10-CM | POA: Diagnosis not present

## 2015-05-09 DIAGNOSIS — I472 Ventricular tachycardia: Secondary | ICD-10-CM | POA: Insufficient documentation

## 2015-05-09 DIAGNOSIS — I4729 Other ventricular tachycardia: Secondary | ICD-10-CM | POA: Insufficient documentation

## 2015-05-09 DIAGNOSIS — I272 Other secondary pulmonary hypertension: Secondary | ICD-10-CM | POA: Diagnosis not present

## 2015-05-10 ENCOUNTER — Other Ambulatory Visit: Payer: Self-pay

## 2015-05-10 DIAGNOSIS — K644 Residual hemorrhoidal skin tags: Secondary | ICD-10-CM | POA: Diagnosis not present

## 2015-05-10 DIAGNOSIS — I480 Paroxysmal atrial fibrillation: Secondary | ICD-10-CM | POA: Diagnosis not present

## 2015-05-10 DIAGNOSIS — G4733 Obstructive sleep apnea (adult) (pediatric): Secondary | ICD-10-CM | POA: Diagnosis not present

## 2015-05-10 DIAGNOSIS — K648 Other hemorrhoids: Secondary | ICD-10-CM | POA: Diagnosis not present

## 2015-05-10 DIAGNOSIS — K219 Gastro-esophageal reflux disease without esophagitis: Secondary | ICD-10-CM | POA: Diagnosis not present

## 2015-05-10 DIAGNOSIS — Z7982 Long term (current) use of aspirin: Secondary | ICD-10-CM | POA: Diagnosis not present

## 2015-05-10 DIAGNOSIS — R251 Tremor, unspecified: Secondary | ICD-10-CM | POA: Diagnosis not present

## 2015-05-10 DIAGNOSIS — I872 Venous insufficiency (chronic) (peripheral): Secondary | ICD-10-CM | POA: Diagnosis not present

## 2015-05-10 DIAGNOSIS — Z9981 Dependence on supplemental oxygen: Secondary | ICD-10-CM | POA: Diagnosis not present

## 2015-05-10 DIAGNOSIS — Z79899 Other long term (current) drug therapy: Secondary | ICD-10-CM | POA: Diagnosis not present

## 2015-05-10 DIAGNOSIS — I10 Essential (primary) hypertension: Secondary | ICD-10-CM | POA: Diagnosis not present

## 2015-05-11 DIAGNOSIS — Z9981 Dependence on supplemental oxygen: Secondary | ICD-10-CM | POA: Diagnosis not present

## 2015-05-11 DIAGNOSIS — Z7982 Long term (current) use of aspirin: Secondary | ICD-10-CM | POA: Diagnosis not present

## 2015-05-11 DIAGNOSIS — R251 Tremor, unspecified: Secondary | ICD-10-CM | POA: Diagnosis not present

## 2015-05-11 DIAGNOSIS — G4733 Obstructive sleep apnea (adult) (pediatric): Secondary | ICD-10-CM | POA: Diagnosis not present

## 2015-05-11 DIAGNOSIS — Z79899 Other long term (current) drug therapy: Secondary | ICD-10-CM | POA: Diagnosis not present

## 2015-05-11 DIAGNOSIS — I10 Essential (primary) hypertension: Secondary | ICD-10-CM | POA: Diagnosis not present

## 2015-05-11 DIAGNOSIS — K644 Residual hemorrhoidal skin tags: Secondary | ICD-10-CM | POA: Diagnosis not present

## 2015-05-11 DIAGNOSIS — I480 Paroxysmal atrial fibrillation: Secondary | ICD-10-CM | POA: Diagnosis not present

## 2015-05-11 DIAGNOSIS — K219 Gastro-esophageal reflux disease without esophagitis: Secondary | ICD-10-CM | POA: Diagnosis not present

## 2015-05-11 DIAGNOSIS — I872 Venous insufficiency (chronic) (peripheral): Secondary | ICD-10-CM | POA: Diagnosis not present

## 2015-05-11 DIAGNOSIS — K648 Other hemorrhoids: Secondary | ICD-10-CM | POA: Diagnosis not present

## 2015-05-16 DIAGNOSIS — G4733 Obstructive sleep apnea (adult) (pediatric): Secondary | ICD-10-CM | POA: Diagnosis not present

## 2015-05-28 DIAGNOSIS — C44319 Basal cell carcinoma of skin of other parts of face: Secondary | ICD-10-CM | POA: Diagnosis not present

## 2015-06-11 DIAGNOSIS — G4733 Obstructive sleep apnea (adult) (pediatric): Secondary | ICD-10-CM | POA: Diagnosis not present

## 2015-06-11 DIAGNOSIS — I480 Paroxysmal atrial fibrillation: Secondary | ICD-10-CM | POA: Diagnosis not present

## 2015-06-11 DIAGNOSIS — I10 Essential (primary) hypertension: Secondary | ICD-10-CM | POA: Diagnosis not present

## 2015-06-16 DIAGNOSIS — G4733 Obstructive sleep apnea (adult) (pediatric): Secondary | ICD-10-CM | POA: Diagnosis not present

## 2015-06-19 DIAGNOSIS — I4891 Unspecified atrial fibrillation: Secondary | ICD-10-CM | POA: Diagnosis not present

## 2015-06-21 DIAGNOSIS — I4891 Unspecified atrial fibrillation: Secondary | ICD-10-CM | POA: Diagnosis not present

## 2015-07-04 DIAGNOSIS — Z6828 Body mass index (BMI) 28.0-28.9, adult: Secondary | ICD-10-CM | POA: Diagnosis not present

## 2015-07-04 DIAGNOSIS — S61411A Laceration without foreign body of right hand, initial encounter: Secondary | ICD-10-CM | POA: Diagnosis not present

## 2015-07-17 DIAGNOSIS — G4733 Obstructive sleep apnea (adult) (pediatric): Secondary | ICD-10-CM | POA: Diagnosis not present

## 2015-07-18 DIAGNOSIS — Z0000 Encounter for general adult medical examination without abnormal findings: Secondary | ICD-10-CM | POA: Diagnosis not present

## 2015-07-18 DIAGNOSIS — Z125 Encounter for screening for malignant neoplasm of prostate: Secondary | ICD-10-CM | POA: Diagnosis not present

## 2015-07-18 DIAGNOSIS — Z23 Encounter for immunization: Secondary | ICD-10-CM | POA: Diagnosis not present

## 2015-07-18 DIAGNOSIS — Z6828 Body mass index (BMI) 28.0-28.9, adult: Secondary | ICD-10-CM | POA: Diagnosis not present

## 2015-07-26 DIAGNOSIS — G4733 Obstructive sleep apnea (adult) (pediatric): Secondary | ICD-10-CM | POA: Diagnosis not present

## 2015-08-16 DIAGNOSIS — G4733 Obstructive sleep apnea (adult) (pediatric): Secondary | ICD-10-CM | POA: Diagnosis not present

## 2015-09-16 DIAGNOSIS — G4733 Obstructive sleep apnea (adult) (pediatric): Secondary | ICD-10-CM | POA: Diagnosis not present

## 2015-09-28 IMAGING — CR DG LUMBAR SPINE 1V
1 series · 1 of 1 positions shown · non-contrast
Comparison: MRI scan of June 13, 2014.

CLINICAL DATA: L4-5 fusion.

EXAM:
LUMBAR SPINE - 1 VIEW

[lat]
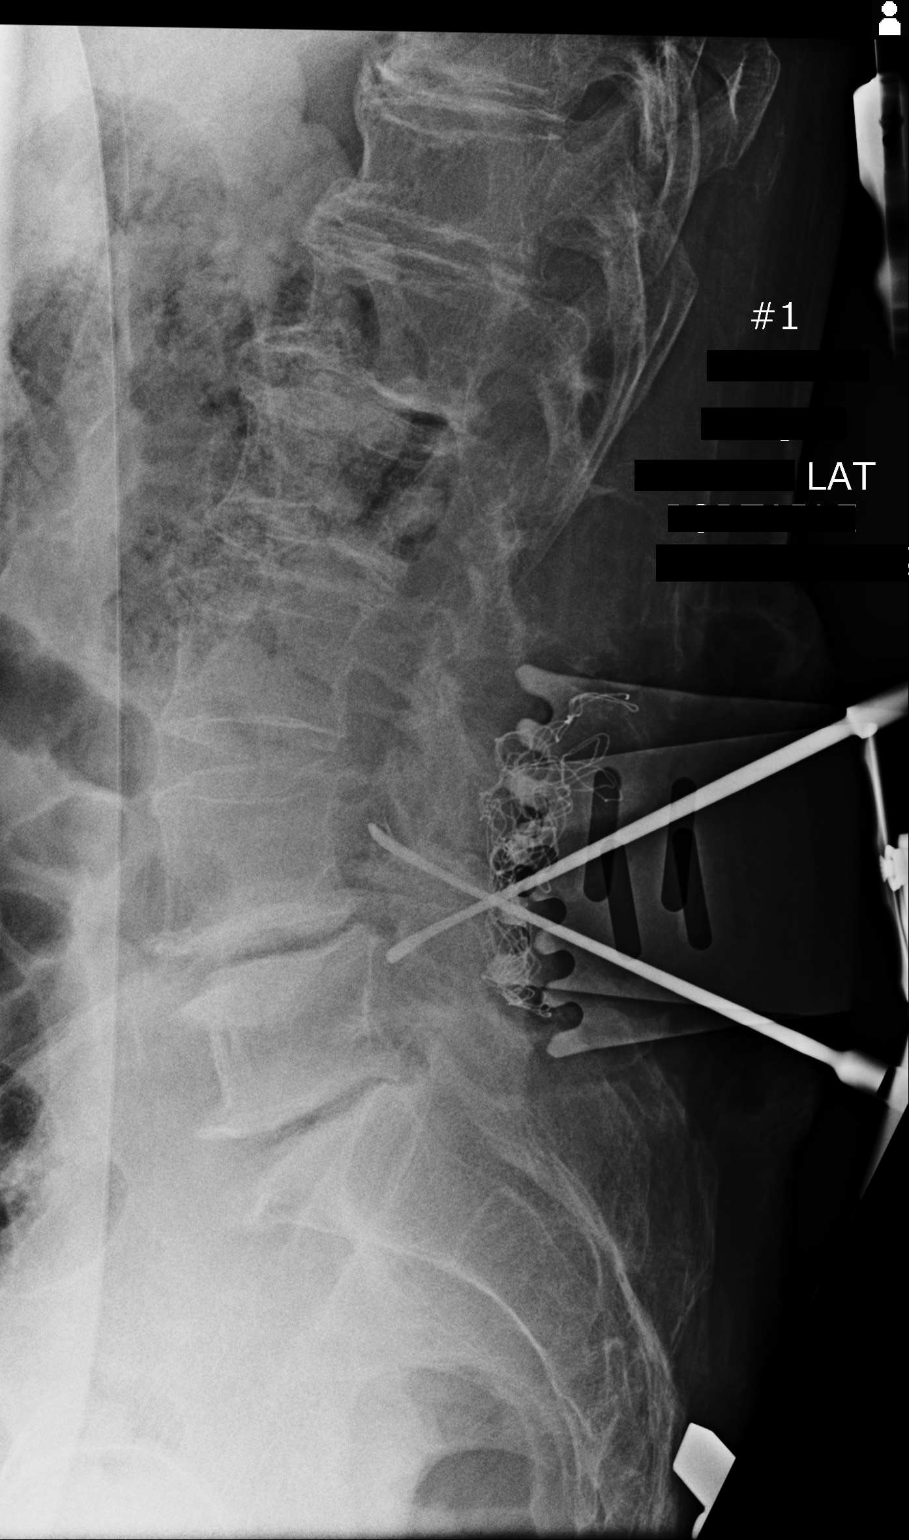

[1 of 1 positions shown; findings below may reference images not displayed]

FINDINGS: Single lateral intraoperative view of the lumbar spine demonstrates
surgical retractors in the soft tissues posterior to L3, L4 and L5.
One surgical probe is seen directed toward the posterior portion of
the L4 vertebral body, while another is directed toward the
posterior portion of the L5 vertebral body. Degenerative disc
disease is noted at L4-5 and L5-S1.
IMPRESSION: Surgical localization as described above.

## 2015-10-01 IMAGING — CR DG CHEST 1V PORT
1 series · 1 of 1 positions shown · non-contrast
Comparison: PA and lateral chest of June 20, 2014

CLINICAL DATA: Syncope and shortness of breath

EXAM:
PORTABLE CHEST - 1 VIEW

[AP]
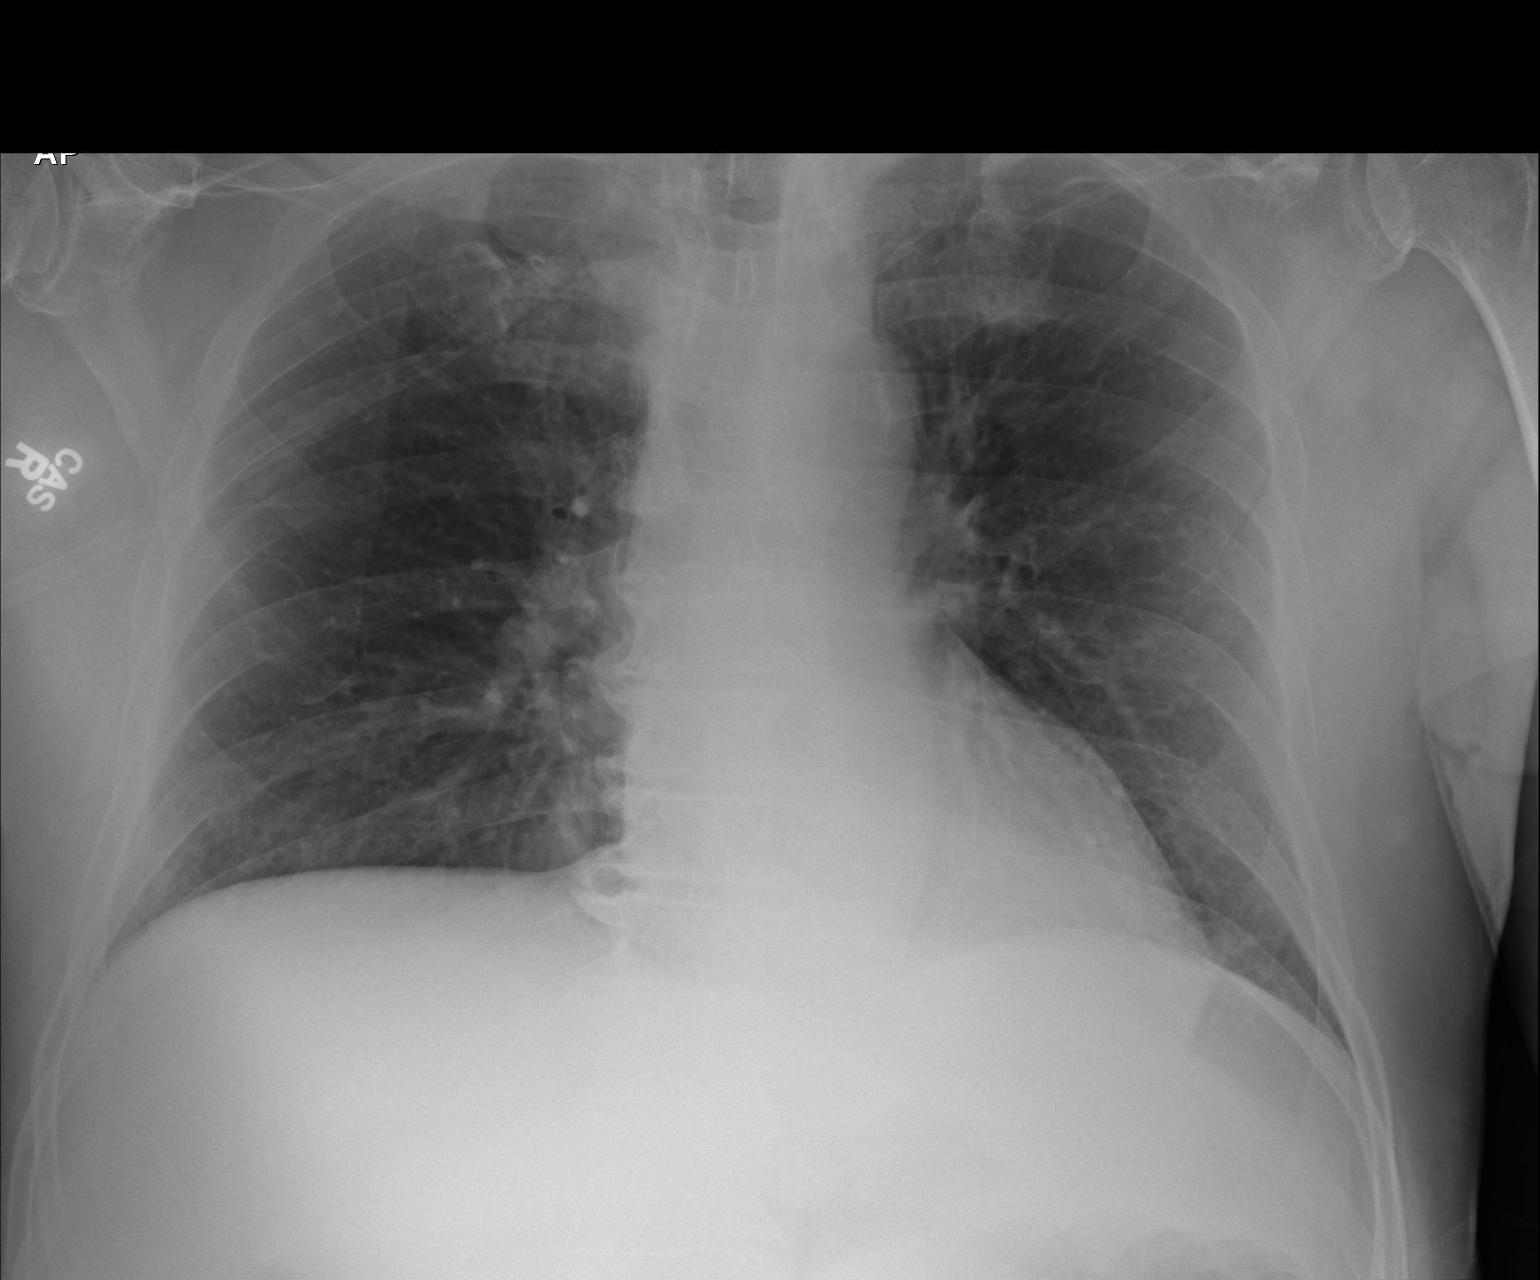

[1 of 1 positions shown; findings below may reference images not displayed]

FINDINGS: The lungs are adequately inflated. There is no focal infiltrate. The
heart and pulmonary vascularity are normal. There is no pleural
effusion. There is degenerative disc change of the midcervical
spine.
IMPRESSION: There is no acute cardiopulmonary abnormality.

## 2015-10-09 DIAGNOSIS — H2513 Age-related nuclear cataract, bilateral: Secondary | ICD-10-CM | POA: Diagnosis not present

## 2015-11-29 DIAGNOSIS — G4733 Obstructive sleep apnea (adult) (pediatric): Secondary | ICD-10-CM | POA: Diagnosis not present

## 2015-11-29 DIAGNOSIS — Z9989 Dependence on other enabling machines and devices: Secondary | ICD-10-CM | POA: Diagnosis not present

## 2015-12-09 DIAGNOSIS — G4733 Obstructive sleep apnea (adult) (pediatric): Secondary | ICD-10-CM | POA: Diagnosis not present

## 2015-12-24 DIAGNOSIS — J101 Influenza due to other identified influenza virus with other respiratory manifestations: Secondary | ICD-10-CM | POA: Diagnosis not present

## 2015-12-24 DIAGNOSIS — Z6827 Body mass index (BMI) 27.0-27.9, adult: Secondary | ICD-10-CM | POA: Diagnosis not present

## 2015-12-24 DIAGNOSIS — J180 Bronchopneumonia, unspecified organism: Secondary | ICD-10-CM | POA: Diagnosis not present

## 2016-01-09 DIAGNOSIS — J180 Bronchopneumonia, unspecified organism: Secondary | ICD-10-CM | POA: Diagnosis not present

## 2016-01-15 DIAGNOSIS — J180 Bronchopneumonia, unspecified organism: Secondary | ICD-10-CM | POA: Diagnosis not present

## 2016-01-22 DIAGNOSIS — J180 Bronchopneumonia, unspecified organism: Secondary | ICD-10-CM | POA: Diagnosis not present

## 2016-02-18 DIAGNOSIS — D1723 Benign lipomatous neoplasm of skin and subcutaneous tissue of right leg: Secondary | ICD-10-CM | POA: Diagnosis not present

## 2016-02-18 DIAGNOSIS — J180 Bronchopneumonia, unspecified organism: Secondary | ICD-10-CM | POA: Diagnosis not present

## 2016-02-22 DIAGNOSIS — J180 Bronchopneumonia, unspecified organism: Secondary | ICD-10-CM | POA: Diagnosis not present

## 2016-03-20 DIAGNOSIS — G4733 Obstructive sleep apnea (adult) (pediatric): Secondary | ICD-10-CM | POA: Diagnosis not present

## 2016-03-23 DIAGNOSIS — J180 Bronchopneumonia, unspecified organism: Secondary | ICD-10-CM | POA: Diagnosis not present

## 2016-04-23 DIAGNOSIS — J180 Bronchopneumonia, unspecified organism: Secondary | ICD-10-CM | POA: Diagnosis not present

## 2016-05-20 DIAGNOSIS — I482 Chronic atrial fibrillation: Secondary | ICD-10-CM | POA: Diagnosis not present

## 2016-05-20 DIAGNOSIS — G4733 Obstructive sleep apnea (adult) (pediatric): Secondary | ICD-10-CM | POA: Diagnosis not present

## 2016-05-20 DIAGNOSIS — I272 Other secondary pulmonary hypertension: Secondary | ICD-10-CM | POA: Diagnosis not present

## 2016-05-23 DIAGNOSIS — J180 Bronchopneumonia, unspecified organism: Secondary | ICD-10-CM | POA: Diagnosis not present

## 2016-05-26 DIAGNOSIS — Z800 Family history of malignant neoplasm of digestive organs: Secondary | ICD-10-CM | POA: Diagnosis not present

## 2016-05-26 DIAGNOSIS — Z1211 Encounter for screening for malignant neoplasm of colon: Secondary | ICD-10-CM | POA: Diagnosis not present

## 2016-06-08 DIAGNOSIS — G4733 Obstructive sleep apnea (adult) (pediatric): Secondary | ICD-10-CM | POA: Diagnosis not present

## 2016-06-08 DIAGNOSIS — I482 Chronic atrial fibrillation: Secondary | ICD-10-CM | POA: Diagnosis not present

## 2016-06-08 DIAGNOSIS — I272 Other secondary pulmonary hypertension: Secondary | ICD-10-CM | POA: Diagnosis not present

## 2016-06-22 DIAGNOSIS — K219 Gastro-esophageal reflux disease without esophagitis: Secondary | ICD-10-CM | POA: Diagnosis not present

## 2016-06-22 DIAGNOSIS — Z1211 Encounter for screening for malignant neoplasm of colon: Secondary | ICD-10-CM | POA: Diagnosis not present

## 2016-06-22 DIAGNOSIS — Z800 Family history of malignant neoplasm of digestive organs: Secondary | ICD-10-CM | POA: Diagnosis not present

## 2016-06-22 DIAGNOSIS — Z86010 Personal history of colonic polyps: Secondary | ICD-10-CM | POA: Diagnosis not present

## 2016-06-22 DIAGNOSIS — K648 Other hemorrhoids: Secondary | ICD-10-CM | POA: Diagnosis not present

## 2016-06-22 DIAGNOSIS — I4891 Unspecified atrial fibrillation: Secondary | ICD-10-CM | POA: Diagnosis not present

## 2016-06-22 DIAGNOSIS — N400 Enlarged prostate without lower urinary tract symptoms: Secondary | ICD-10-CM | POA: Diagnosis not present

## 2016-06-22 DIAGNOSIS — Z79899 Other long term (current) drug therapy: Secondary | ICD-10-CM | POA: Diagnosis not present

## 2016-06-22 DIAGNOSIS — G4730 Sleep apnea, unspecified: Secondary | ICD-10-CM | POA: Diagnosis not present

## 2016-06-22 DIAGNOSIS — J309 Allergic rhinitis, unspecified: Secondary | ICD-10-CM | POA: Diagnosis not present

## 2016-06-22 DIAGNOSIS — K5730 Diverticulosis of large intestine without perforation or abscess without bleeding: Secondary | ICD-10-CM | POA: Diagnosis not present

## 2016-06-22 DIAGNOSIS — M109 Gout, unspecified: Secondary | ICD-10-CM | POA: Diagnosis not present

## 2016-06-23 DIAGNOSIS — J180 Bronchopneumonia, unspecified organism: Secondary | ICD-10-CM | POA: Diagnosis not present

## 2016-06-23 DIAGNOSIS — G4733 Obstructive sleep apnea (adult) (pediatric): Secondary | ICD-10-CM | POA: Diagnosis not present

## 2016-07-24 DIAGNOSIS — J180 Bronchopneumonia, unspecified organism: Secondary | ICD-10-CM | POA: Diagnosis not present

## 2016-07-31 DIAGNOSIS — J40 Bronchitis, not specified as acute or chronic: Secondary | ICD-10-CM | POA: Diagnosis not present

## 2016-08-10 DIAGNOSIS — Z23 Encounter for immunization: Secondary | ICD-10-CM | POA: Diagnosis not present

## 2016-08-20 DIAGNOSIS — I10 Essential (primary) hypertension: Secondary | ICD-10-CM | POA: Diagnosis not present

## 2016-08-20 DIAGNOSIS — G4733 Obstructive sleep apnea (adult) (pediatric): Secondary | ICD-10-CM | POA: Diagnosis not present

## 2016-08-20 DIAGNOSIS — I482 Chronic atrial fibrillation: Secondary | ICD-10-CM | POA: Diagnosis not present

## 2016-08-20 DIAGNOSIS — I420 Dilated cardiomyopathy: Secondary | ICD-10-CM | POA: Diagnosis not present

## 2016-08-20 DIAGNOSIS — I272 Pulmonary hypertension, unspecified: Secondary | ICD-10-CM | POA: Diagnosis not present

## 2016-08-23 DIAGNOSIS — J180 Bronchopneumonia, unspecified organism: Secondary | ICD-10-CM | POA: Diagnosis not present

## 2016-08-25 DIAGNOSIS — I420 Dilated cardiomyopathy: Secondary | ICD-10-CM | POA: Diagnosis not present

## 2016-08-25 DIAGNOSIS — I10 Essential (primary) hypertension: Secondary | ICD-10-CM | POA: Diagnosis not present

## 2016-08-25 DIAGNOSIS — I272 Pulmonary hypertension, unspecified: Secondary | ICD-10-CM | POA: Diagnosis not present

## 2016-08-25 DIAGNOSIS — I482 Chronic atrial fibrillation: Secondary | ICD-10-CM | POA: Diagnosis not present

## 2016-08-25 DIAGNOSIS — G4733 Obstructive sleep apnea (adult) (pediatric): Secondary | ICD-10-CM | POA: Diagnosis not present

## 2016-09-03 DIAGNOSIS — I10 Essential (primary) hypertension: Secondary | ICD-10-CM | POA: Diagnosis not present

## 2016-09-04 DIAGNOSIS — J019 Acute sinusitis, unspecified: Secondary | ICD-10-CM | POA: Diagnosis not present

## 2016-09-23 DIAGNOSIS — J180 Bronchopneumonia, unspecified organism: Secondary | ICD-10-CM | POA: Diagnosis not present

## 2017-03-03 DIAGNOSIS — K4090 Unilateral inguinal hernia, without obstruction or gangrene, not specified as recurrent: Secondary | ICD-10-CM | POA: Diagnosis not present

## 2017-03-03 DIAGNOSIS — Z01810 Encounter for preprocedural cardiovascular examination: Secondary | ICD-10-CM | POA: Diagnosis not present

## 2017-03-03 DIAGNOSIS — K219 Gastro-esophageal reflux disease without esophagitis: Secondary | ICD-10-CM | POA: Diagnosis not present

## 2017-03-03 DIAGNOSIS — I10 Essential (primary) hypertension: Secondary | ICD-10-CM | POA: Diagnosis not present

## 2017-03-03 DIAGNOSIS — G4733 Obstructive sleep apnea (adult) (pediatric): Secondary | ICD-10-CM | POA: Diagnosis not present

## 2017-03-03 DIAGNOSIS — Z9989 Dependence on other enabling machines and devices: Secondary | ICD-10-CM | POA: Diagnosis not present

## 2017-03-03 DIAGNOSIS — Z79899 Other long term (current) drug therapy: Secondary | ICD-10-CM | POA: Diagnosis not present

## 2017-03-04 DIAGNOSIS — G4733 Obstructive sleep apnea (adult) (pediatric): Secondary | ICD-10-CM | POA: Diagnosis not present

## 2017-03-04 DIAGNOSIS — Z9989 Dependence on other enabling machines and devices: Secondary | ICD-10-CM | POA: Diagnosis not present

## 2017-03-04 DIAGNOSIS — I10 Essential (primary) hypertension: Secondary | ICD-10-CM | POA: Diagnosis not present

## 2017-03-04 DIAGNOSIS — K219 Gastro-esophageal reflux disease without esophagitis: Secondary | ICD-10-CM | POA: Diagnosis not present

## 2017-03-04 DIAGNOSIS — K4090 Unilateral inguinal hernia, without obstruction or gangrene, not specified as recurrent: Secondary | ICD-10-CM | POA: Diagnosis not present

## 2017-03-04 DIAGNOSIS — Z79899 Other long term (current) drug therapy: Secondary | ICD-10-CM | POA: Diagnosis not present

## 2017-03-18 DIAGNOSIS — M79672 Pain in left foot: Secondary | ICD-10-CM | POA: Diagnosis not present

## 2017-04-12 DIAGNOSIS — L03115 Cellulitis of right lower limb: Secondary | ICD-10-CM | POA: Diagnosis not present

## 2017-04-22 DIAGNOSIS — G4733 Obstructive sleep apnea (adult) (pediatric): Secondary | ICD-10-CM | POA: Diagnosis not present

## 2017-04-27 ENCOUNTER — Telehealth: Payer: Self-pay

## 2017-04-27 DIAGNOSIS — I4891 Unspecified atrial fibrillation: Secondary | ICD-10-CM

## 2017-04-27 NOTE — Telephone Encounter (Signed)
Order placed for echo based off office visit from prior office back in March.

## 2017-05-04 DIAGNOSIS — R600 Localized edema: Secondary | ICD-10-CM | POA: Diagnosis not present

## 2017-05-04 DIAGNOSIS — L03115 Cellulitis of right lower limb: Secondary | ICD-10-CM | POA: Diagnosis not present

## 2017-05-09 DIAGNOSIS — S81839A Puncture wound without foreign body, unspecified lower leg, initial encounter: Secondary | ICD-10-CM | POA: Diagnosis not present

## 2017-05-09 DIAGNOSIS — R7989 Other specified abnormal findings of blood chemistry: Secondary | ICD-10-CM | POA: Diagnosis not present

## 2017-05-09 DIAGNOSIS — S81811A Laceration without foreign body, right lower leg, initial encounter: Secondary | ICD-10-CM | POA: Diagnosis not present

## 2017-05-09 DIAGNOSIS — R609 Edema, unspecified: Secondary | ICD-10-CM | POA: Diagnosis not present

## 2017-05-09 DIAGNOSIS — R600 Localized edema: Secondary | ICD-10-CM | POA: Diagnosis not present

## 2017-05-09 DIAGNOSIS — M7989 Other specified soft tissue disorders: Secondary | ICD-10-CM | POA: Diagnosis not present

## 2017-05-11 DIAGNOSIS — S81811D Laceration without foreign body, right lower leg, subsequent encounter: Secondary | ICD-10-CM | POA: Diagnosis not present

## 2017-05-13 ENCOUNTER — Other Ambulatory Visit (HOSPITAL_BASED_OUTPATIENT_CLINIC_OR_DEPARTMENT_OTHER): Payer: Self-pay

## 2017-05-18 ENCOUNTER — Ambulatory Visit: Payer: Self-pay | Admitting: Cardiology

## 2017-05-27 DIAGNOSIS — L03115 Cellulitis of right lower limb: Secondary | ICD-10-CM | POA: Diagnosis not present

## 2017-06-07 DIAGNOSIS — M7541 Impingement syndrome of right shoulder: Secondary | ICD-10-CM | POA: Diagnosis not present

## 2017-06-07 DIAGNOSIS — G8929 Other chronic pain: Secondary | ICD-10-CM | POA: Diagnosis not present

## 2017-06-07 DIAGNOSIS — M50321 Other cervical disc degeneration at C4-C5 level: Secondary | ICD-10-CM | POA: Diagnosis not present

## 2017-06-07 DIAGNOSIS — M25511 Pain in right shoulder: Secondary | ICD-10-CM | POA: Diagnosis not present

## 2017-06-07 DIAGNOSIS — M19021 Primary osteoarthritis, right elbow: Secondary | ICD-10-CM | POA: Diagnosis not present

## 2017-06-07 DIAGNOSIS — M5412 Radiculopathy, cervical region: Secondary | ICD-10-CM | POA: Diagnosis not present

## 2017-06-07 DIAGNOSIS — M19011 Primary osteoarthritis, right shoulder: Secondary | ICD-10-CM | POA: Diagnosis not present

## 2017-06-07 DIAGNOSIS — M50322 Other cervical disc degeneration at C5-C6 level: Secondary | ICD-10-CM | POA: Diagnosis not present

## 2017-06-07 DIAGNOSIS — M25521 Pain in right elbow: Secondary | ICD-10-CM | POA: Diagnosis not present

## 2017-06-07 DIAGNOSIS — Z7901 Long term (current) use of anticoagulants: Secondary | ICD-10-CM | POA: Diagnosis not present

## 2017-06-07 DIAGNOSIS — M5010 Cervical disc disorder with radiculopathy, unspecified cervical region: Secondary | ICD-10-CM | POA: Diagnosis not present

## 2017-06-15 ENCOUNTER — Ambulatory Visit (INDEPENDENT_AMBULATORY_CARE_PROVIDER_SITE_OTHER): Payer: Medicare Other | Admitting: Cardiology

## 2017-06-15 ENCOUNTER — Encounter: Payer: Self-pay | Admitting: Cardiology

## 2017-06-15 VITALS — BP 150/68 | HR 80 | Resp 10 | Ht 75.0 in | Wt 219.8 lb

## 2017-06-15 DIAGNOSIS — I480 Paroxysmal atrial fibrillation: Secondary | ICD-10-CM

## 2017-06-15 DIAGNOSIS — I10 Essential (primary) hypertension: Secondary | ICD-10-CM

## 2017-06-15 DIAGNOSIS — I420 Dilated cardiomyopathy: Secondary | ICD-10-CM | POA: Diagnosis not present

## 2017-06-15 DIAGNOSIS — I272 Pulmonary hypertension, unspecified: Secondary | ICD-10-CM | POA: Diagnosis not present

## 2017-06-15 NOTE — Progress Notes (Signed)
Cardiology Office Note:    Date:  06/15/2017   ID:  Adam Bennett, DOB 14-Jun-1941, MRN [id number]  PCP:  [name], MD  Cardiologist:  Jenne Campus, MD    Referring MD: [name], MD   Chief Complaint  Patient presents with  . Follow-up  Follow-up cardiomyopathy  History of Present Illness:    Adam Bennett is a 76 y.o. male  with history of cardiomyopathy. Last ejection fraction was 40-45%. He is due to have another echocardiogram done. Cardiac-wise doing well denies having any chest pain dizziness passing out or palpitations. The biggest problem that he had his some injury to his leg he suffer from eventually getting cellulitis. He did have chronic leg swelling because of that he was given diuretics with some improvement overall much better and will discuss completely healed.  Past Medical History:  Diagnosis Date  . A-fib ([place])   . Chronic sinus complaints   . Dilated cardiomyopathy ([place])   . GERD (gastroesophageal reflux disease)   . Hypertension   . NSVT (nonsustained ventricular tachycardia) (HCC)     Past Surgical History:  Procedure Laterality Date  . BACK SURGERY    . CARDIAC CATHETERIZATION     DONE IN [place], 56YRS AGO  . CHOLECYSTECTOMY    . FOOT SURGERY    . HEMORRHOID SURGERY    . HERNIA REPAIR    . JOINT REPLACEMENT     BILATER KNEE REPLACEMENTS  . TRANSURETHRAL RESECTION OF PROSTATE      Current Medications: Current Meds  Medication Sig  . albuterol (PROAIR HFA) 108 (90 Base) MCG/ACT inhaler Inhale 1 puff into the lungs as needed for wheezing or shortness of breath.  Marland Kitchen aspirin EC 81 MG tablet Take 81 mg by mouth daily.  Marland Kitchen diltiazem (CARDIZEM CD) 180 MG 24 hr capsule Take 1 capsule by mouth daily.  Marland Kitchen docusate sodium (COLACE) 100 MG capsule Take 100 mg by mouth 2 (two) times daily.  . furosemide (LASIX) 20 MG tablet Take 1 tablet by mouth daily.  Marland Kitchen lisinopril (PRINIVIL,ZESTRIL) 5 MG tablet Take 5 mg by mouth daily.  . metoprolol succinate  (TOPROL-XL) 50 MG 24 hr tablet Take 75 mg by mouth daily.  . Multiple Vitamin (MULTIVITAMIN WITH MINERALS) TABS tablet Take 1 tablet by mouth daily.  Marland Kitchen omeprazole (PRILOSEC) 20 MG capsule Take 20 mg by mouth 2 (two) times daily before a meal.  . polyethylene glycol (MIRALAX / GLYCOLAX) packet Take 17 g by mouth 2 (two) times daily.     Allergies:   Patient has no known allergies.   Social History   Social History  . Marital status: Married    Spouse name: N/A  . Number of children: N/A  . Years of education: N/A   Social History Main Topics  . Smoking status: Never Smoker  . Smokeless tobacco: Never Used  . Alcohol use Yes     Comment: occasional/rare  . Drug use: No  . Sexual activity: Not Asked   Other Topics Concern  . None   Social History Narrative  . None     Family History: The patient's family history includes CAD in his brother and father; Colon cancer in his mother; Congestive Heart Failure in his brother and father; Diabetes in his unknown relative; Hypertension in his unknown relative. ROS:   Please see the history of present illness.    All 14 point review of systems negative except as described per history of present illness  EKGs/Labs/Other Studies Reviewed:      Recent Labs: No results found for requested labs within last 8760 hours.  Recent Lipid Panel No results found for: CHOL, TRIG, HDL, CHOLHDL, VLDL, LDLCALC, LDLDIRECT  Physical Exam:    VS:  BP (!) 150/68   Pulse 80   Resp 10   Ht 6\' 3"  (1.905 m)   Wt 219 lb 12.8 oz (99.7 kg)   BMI 27.47 kg/m     Wt Readings from Last 3 Encounters:  06/15/17 219 lb 12.8 oz (99.7 kg)  06/22/14 211 lb (95.7 kg)  06/20/14 211 lb 8 oz (95.9 kg)     GEN:  Well nourished, well developed in no acute distress HEENT: Normal NECK: No JVD; No carotid bruits LYMPHATICS: No lymphadenopathy CARDIAC: RRR, no murmurs, no rubs, no gallops RESPIRATORY:  Clear to auscultation without rales, wheezing or rhonchi    ABDOMEN: Soft, non-tender, non-distended MUSCULOSKELETAL:  No edema; No deformity  SKIN: Warm and dry LOWER EXTREMITIES: no swelling NEUROLOGIC:  Alert and oriented x 3 PSYCHIATRIC:  Normal affect   ASSESSMENT:    1. Paroxysmal atrial fibrillation (HCC)   2. Essential hypertension, benign   3. Dilated cardiomyopathy ([place])   4. Pulmonary hypertension ([place])    PLAN:    In order of problems listed above:  1. Paroxysmal mitral fibrillation: Again we had discussion about anticoagulation is reluctant to go on those medications he agreed to have his echocardiogram done to check left atrial size. 2. Blood pressure always elevated in the office but homeschooled. We'll continue present medications. 3. Dilated cardiomyopathy: We will do an echocardiogram to check ejection fraction. 4. Pulmonary hypertension will get echocardiogram to assess.   Medication Adjustments/Labs and Tests Ordered: Current medicines are reviewed at length with the patient today.  Concerns regarding medicines are outlined above.  No orders of the defined types were placed in this encounter.  Medication changes: No orders of the defined types were placed in this encounter.   Signed, Park Liter, MD, [facility] 06/15/2017 4:57 PM    [place]

## 2017-06-15 NOTE — Patient Instructions (Addendum)
Medication Instructions:  Your physician recommends that you continue on your current medications as directed. Please refer to the Current Medication list given to you today.  Labwork: Your physician recommends that you have lab work today in office.  Testing/Procedures: Your physician has requested that you have an echocardiogram. Echocardiography is a painless test that uses sound waves to create images of your heart. It provides your doctor with information about the size and shape of your heart and how well your heart's chambers and valves are working. This procedure takes approximately one hour. There are no restrictions for this procedure.   Follow-Up: Your physician recommends that you schedule a follow-up appointment in: 2 months   Any Other Special Instructions Will Be Listed Below (If Applicable).  Please note that any paperwork needing to be filled out by the provider will need to be addressed at the front desk prior to seeing the provider. Please note that any paperwork FMLA, Disability or other documents regarding health condition is subject to a $25.00 charge that must be received prior to completion of paperwork in the form of a money order or check.    If you need a refill on your cardiac medications before your next appointment, please call your pharmacy.

## 2017-06-16 ENCOUNTER — Telehealth: Payer: Self-pay

## 2017-06-16 LAB — BASIC METABOLIC PANEL
BUN / CREAT RATIO: 15 (ref 10–24)
BUN: 14 mg/dL (ref 8–27)
CO2: 26 mmol/L (ref 20–29)
Calcium: 9.1 mg/dL (ref 8.6–10.2)
Chloride: 102 mmol/L (ref 96–106)
Creatinine, Ser: 0.91 mg/dL (ref 0.76–1.27)
GFR calc Af Amer: 96 mL/min/{1.73_m2} (ref >59)
GFR, EST NON AFRICAN AMERICAN: 83 mL/min/{1.73_m2} (ref >59)
Glucose: 104 mg/dL — ABNORMAL HIGH (ref 65–99)
Potassium: 4.1 mmol/L (ref 3.5–5.2)
Sodium: 140 mmol/L (ref 134–144)

## 2017-06-16 LAB — LIPID PANEL
CHOL/HDL RATIO: 4.3 ratio (ref 0.0–5.0)
Cholesterol, Total: 190 mg/dL (ref 100–199)
HDL: 44 mg/dL (ref >39)
LDL Calculated: 107 mg/dL — ABNORMAL HIGH (ref 0–99)
Triglycerides: 193 mg/dL — ABNORMAL HIGH (ref 0–149)
VLDL CHOLESTEROL CAL: 39 mg/dL (ref 5–40)

## 2017-06-16 NOTE — Telephone Encounter (Signed)
-----   Message from Benancio Deeds sent at 06/16/2017  9:16 AM EDT ----- Regarding: RE: pre cert No precert required  Thank you,  Caryl Pina  ----- Message ----- From: Kathyrn Sheriff, RN Sent: 06/16/2017   8:22 AM To: [facility] Pre Cert/Auth Subject: pre cert                                       Testing: Echo  ICD: Dilated cardiomyopathy ([place]) [I42.0]  [facility]  DOS: once precerted  Ordering: Agustin Cree

## 2017-06-16 NOTE — Telephone Encounter (Signed)
Appointment at [facility] Friday Aug 24 at 1:45 with an arrival of 1:15.

## 2017-06-16 NOTE — Telephone Encounter (Signed)
Left message advising of pts appointment,

## 2017-06-18 DIAGNOSIS — I420 Dilated cardiomyopathy: Secondary | ICD-10-CM | POA: Diagnosis not present

## 2017-06-23 ENCOUNTER — Telehealth: Payer: Self-pay

## 2017-06-23 ENCOUNTER — Other Ambulatory Visit: Payer: Self-pay

## 2017-06-23 DIAGNOSIS — I420 Dilated cardiomyopathy: Secondary | ICD-10-CM

## 2017-06-23 DIAGNOSIS — I480 Paroxysmal atrial fibrillation: Secondary | ICD-10-CM

## 2017-06-23 NOTE — Telephone Encounter (Signed)
Pt advised of results. 

## 2017-07-21 ENCOUNTER — Telehealth: Payer: Self-pay

## 2017-07-21 MED ORDER — METOPROLOL SUCCINATE ER 50 MG PO TB24: ORAL_TABLET | ORAL | 6 refills | Status: DC

## 2017-07-21 MED ORDER — METOPROLOL SUCCINATE ER 50 MG PO TB24: 75.0000 mg | ORAL_TABLET | Freq: Every day | ORAL | 6 refills | Status: DC

## 2017-07-21 NOTE — Telephone Encounter (Signed)
Medication resent with corrected script for Metoprolol

## 2017-07-21 NOTE — Telephone Encounter (Signed)
Refill request sent from pharmacy for patient's Metoprolol. Refill has been provided.

## 2017-08-18 ENCOUNTER — Ambulatory Visit: Payer: Self-pay | Admitting: Cardiology

## 2017-08-26 ENCOUNTER — Encounter: Payer: Self-pay | Admitting: Cardiology

## 2017-08-26 ENCOUNTER — Ambulatory Visit (INDEPENDENT_AMBULATORY_CARE_PROVIDER_SITE_OTHER): Payer: Medicare Other | Admitting: Cardiology

## 2017-08-26 VITALS — BP 144/76 | HR 64 | Resp 10 | Ht 75.0 in | Wt 222.0 lb

## 2017-08-26 DIAGNOSIS — I10 Essential (primary) hypertension: Secondary | ICD-10-CM

## 2017-08-26 DIAGNOSIS — I480 Paroxysmal atrial fibrillation: Secondary | ICD-10-CM | POA: Diagnosis not present

## 2017-08-26 DIAGNOSIS — I420 Dilated cardiomyopathy: Secondary | ICD-10-CM | POA: Diagnosis not present

## 2017-08-26 NOTE — Progress Notes (Signed)
Cardiology Office Note:    Date:  08/26/2017   ID:  Adam Bennett, DOB 1941/09/30, MRN [id number]  PCP:  [name], MD  Cardiologist:  Jenne Campus, MD    Referring MD: [name], MD   Chief Complaint  Patient presents with  . 2 month follow up  Doing great  History of Present Illness:    Adam Bennett is a 76 y.o. male with questionable history of atrial fibrillation actually had only one documented episode of atrial fibrillation when he was in the hospital for surgery.  Since that time things are looking good I did echocardiogram on him to assess left atrial size his left atrium is only minimally elevated 4.2-4.3 cm.  We talked again about anticoagulation his chads 2 Vascor equals 2 will be 3 within the next few months when he turns 77.  Basically he is reluctant to start anticoagulation.  We decided to keep watching the situation.  He is takes aspirin for now but again I see him back in about 3 months and we will revisit those issues again.  Blood pressure appears to be minimally elevated but he said when he check it at home is normal ask him to take a note of any blood pressure rechecked at home.  Past Medical History:  Diagnosis Date  . A-fib ([place])   . Chronic sinus complaints   . Dilated cardiomyopathy ([place])   . GERD (gastroesophageal reflux disease)   . Hypertension   . NSVT (nonsustained ventricular tachycardia) (HCC)     Past Surgical History:  Procedure Laterality Date  . BACK SURGERY    . CARDIAC CATHETERIZATION     DONE IN [place], 84YRS AGO  . CHOLECYSTECTOMY    . FOOT SURGERY    . HEMORRHOID SURGERY    . HERNIA REPAIR    . JOINT REPLACEMENT     BILATER KNEE REPLACEMENTS  . TRANSURETHRAL RESECTION OF PROSTATE      Current Medications: Current Meds  Medication Sig  . albuterol (PROAIR HFA) 108 (90 Base) MCG/ACT inhaler Inhale 1 puff into the lungs as needed for wheezing or shortness of breath.  Marland Kitchen aspirin EC 81 MG tablet Take 81 mg by mouth daily.  Marland Kitchen  diltiazem (CARDIZEM CD) 180 MG 24 hr capsule Take 1 capsule by mouth daily.  Marland Kitchen docusate sodium (COLACE) 100 MG capsule Take 100 mg by mouth 2 (two) times daily.  . furosemide (LASIX) 20 MG tablet Take 1 tablet by mouth daily.  Marland Kitchen lisinopril (PRINIVIL,ZESTRIL) 5 MG tablet Take 5 mg by mouth daily.  . metoprolol succinate (TOPROL-XL) 50 MG 24 hr tablet Take 1 and 1/2 tablets daily  . Multiple Vitamin (MULTIVITAMIN WITH MINERALS) TABS tablet Take 1 tablet by mouth daily.  Marland Kitchen omeprazole (PRILOSEC) 20 MG capsule Take 20 mg by mouth 2 (two) times daily before a meal.  . polyethylene glycol (MIRALAX / GLYCOLAX) packet Take 17 g by mouth 2 (two) times daily.     Allergies:   Patient has no known allergies.   Social History   Social History  . Marital status: Married    Spouse name: N/A  . Number of children: N/A  . Years of education: N/A   Social History Main Topics  . Smoking status: Never Smoker  . Smokeless tobacco: Never Used  . Alcohol use Yes     Comment: occasional/rare  . Drug use: No  . Sexual activity: Not Asked   Other Topics Concern  . None  Social History Narrative  . None     Family History: The patient's family history includes CAD in his brother and father; Colon cancer in his mother; Congestive Heart Failure in his brother and father; Diabetes in his unknown relative; Hypertension in his unknown relative. ROS:   Please see the history of present illness.    All 14 point review of systems negative except as described per history of present illness  EKGs/Labs/Other Studies Reviewed:      Recent Labs: 06/15/2017: BUN 14; Creatinine, Ser 0.91; Potassium 4.1; Sodium 140  Recent Lipid Panel    Component Value Date/Time   CHOL 190 06/15/2017 1711   TRIG 193 (H) 06/15/2017 1711   HDL 44 06/15/2017 1711   CHOLHDL 4.3 06/15/2017 1711   LDLCALC 107 (H) 06/15/2017 1711    Physical Exam:    VS:  BP (!) 144/76   Pulse 64   Resp 10   Ht 6\' 3"  (1.905 m)   Wt 222  lb (100.7 kg)   BMI 27.75 kg/m     Wt Readings from Last 3 Encounters:  08/26/17 222 lb (100.7 kg)  06/15/17 219 lb 12.8 oz (99.7 kg)  06/22/14 211 lb (95.7 kg)     GEN:  Well nourished, well developed in no acute distress HEENT: Normal NECK: No JVD; No carotid bruits LYMPHATICS: No lymphadenopathy CARDIAC: RRR, no murmurs, no rubs, no gallops RESPIRATORY:  Clear to auscultation without rales, wheezing or rhonchi  ABDOMEN: Soft, non-tender, non-distended MUSCULOSKELETAL:  No edema; No deformity  SKIN: Warm and dry LOWER EXTREMITIES: no swelling NEUROLOGIC:  Alert and oriented x 3 PSYCHIATRIC:  Normal affect   ASSESSMENT:    1. Paroxysmal atrial fibrillation (HCC)   2. Dilated cardiomyopathy ([place])   3. Essential hypertension, benign   4. Essential hypertension    PLAN:    In order of problems listed above:  1. Proximal mitral fibrillation: Only one documented episodes in discussion as above. 2. History of cardiomyopathy echo cardiac him showed ejection fraction 50% we will continue present management. 3. Essential hypertension continue monitoring I see him back in 3 months   Medication Adjustments/Labs and Tests Ordered: Current medicines are reviewed at length with the patient today.  Concerns regarding medicines are outlined above.  No orders of the defined types were placed in this encounter.  Medication changes: No orders of the defined types were placed in this encounter.   Signed, Park Liter, MD, [facility] 08/26/2017 4:40 PM    [facility]

## 2017-08-26 NOTE — Patient Instructions (Signed)
Medication Instructions:  Your physician recommends that you continue on your current medications as directed. Please refer to the Current Medication list given to you today.  1. Avoid all over-the-counter antihistamines except Claritin/Loratadine and Zyrtec/Cetrizine. 2. Avoid all combination including cold sinus allergies flu decongestant and sleep medications 3. You can use Robitussin DM Mucinex and Mucinex DM for cough. 4. can use Tylenol aspirin ibuprofen and naproxen but no combinations such as sleep or sinus.  Labwork: None    Testing/Procedures: None   Follow-Up: Your physician recommends that you schedule a follow-up appointment in: 3 months  Any Other Special Instructions Will Be Listed Below (If Applicable).  Please note that any paperwork needing to be filled out by the provider will need to be addressed at the front desk prior to seeing the provider. Please note that any paperwork FMLA, Disability or other documents regarding health condition is subject to a $25.00 charge that must be received prior to completion of paperwork in the form of a money order or check.    If you need a refill on your cardiac medications before your next appointment, please call your pharmacy.  

## 2017-09-01 ENCOUNTER — Other Ambulatory Visit: Payer: Self-pay

## 2017-09-01 MED ORDER — LISINOPRIL 5 MG PO TABS: 5.0000 mg | ORAL_TABLET | Freq: Every day | ORAL | 6 refills | Status: DC

## 2017-09-17 DIAGNOSIS — M109 Gout, unspecified: Secondary | ICD-10-CM | POA: Diagnosis not present

## 2017-09-23 DIAGNOSIS — M10072 Idiopathic gout, left ankle and foot: Secondary | ICD-10-CM | POA: Diagnosis not present

## 2017-10-07 DIAGNOSIS — G4733 Obstructive sleep apnea (adult) (pediatric): Secondary | ICD-10-CM | POA: Diagnosis not present

## 2017-12-01 ENCOUNTER — Ambulatory Visit: Payer: Self-pay | Admitting: Cardiology

## 2017-12-07 ENCOUNTER — Ambulatory Visit (INDEPENDENT_AMBULATORY_CARE_PROVIDER_SITE_OTHER): Payer: Medicare Other | Admitting: Cardiology

## 2017-12-07 ENCOUNTER — Encounter: Payer: Self-pay | Admitting: Cardiology

## 2017-12-07 VITALS — BP 138/80 | HR 68 | Ht 75.0 in | Wt 223.4 lb

## 2017-12-07 DIAGNOSIS — I420 Dilated cardiomyopathy: Secondary | ICD-10-CM | POA: Diagnosis not present

## 2017-12-07 DIAGNOSIS — I10 Essential (primary) hypertension: Secondary | ICD-10-CM

## 2017-12-07 DIAGNOSIS — I272 Pulmonary hypertension, unspecified: Secondary | ICD-10-CM

## 2017-12-07 DIAGNOSIS — G4733 Obstructive sleep apnea (adult) (pediatric): Secondary | ICD-10-CM | POA: Diagnosis not present

## 2017-12-07 DIAGNOSIS — Z125 Encounter for screening for malignant neoplasm of prostate: Secondary | ICD-10-CM | POA: Diagnosis not present

## 2017-12-07 NOTE — Patient Instructions (Signed)
Medication Instructions:  Your physician recommends that you continue on your current medications as directed. Please refer to the Current Medication list given to you today.  Labwork: Your physician recommends that you have lab work today: PSA and BMP  Testing/Procedures: None ordered  Follow-Up: Your physician recommends that you schedule a follow-up appointment in: 5 months with Dr. Agustin Cree   Any Other Special Instructions Will Be Listed Below (If Applicable).     If you need a refill on your cardiac medications before your next appointment, please call your pharmacy.

## 2017-12-07 NOTE — Progress Notes (Signed)
Cardiology Office Note:    Date:  12/07/2017   ID:  Adam Bennett, DOB 07-28-1941, MRN [id number]  PCP:  [name], MD  Cardiologist:  Jenne Campus, MD    Referring MD: [name], MD   Chief Complaint  Patient presents with  . 3 month follow up    Since last time pt has noticed his right hand and arm going numb off and on, but not currently numb   Well  History of Present Illness:    Adam Bennett is a 77 y.o. male with history of cardiomyopathy, hypertension.  He seems to be doing well cardiac wise but the problem is he gets tingling and numbness in the his right hand that happens typically in the middle of the night or early in the morning I suspect he may have carpal tunnel syndrome.  Does have any shortness of breath chest pain tightness squeezing pressure burning chest.  Past Medical History:  Diagnosis Date  . A-fib ([place])   . Chronic sinus complaints   . Dilated cardiomyopathy ([place])   . GERD (gastroesophageal reflux disease)   . Hypertension   . NSVT (nonsustained ventricular tachycardia) (HCC)     Past Surgical History:  Procedure Laterality Date  . BACK SURGERY    . CARDIAC CATHETERIZATION     DONE IN [place], 54YRS AGO  . CHOLECYSTECTOMY    . FOOT SURGERY    . HEMORRHOID SURGERY    . HERNIA REPAIR    . JOINT REPLACEMENT     BILATER KNEE REPLACEMENTS  . TRANSURETHRAL RESECTION OF PROSTATE      Current Medications: Current Meds  Medication Sig  . albuterol (PROAIR HFA) 108 (90 Base) MCG/ACT inhaler Inhale 1 puff into the lungs as needed for wheezing or shortness of breath.  . allopurinol (ZYLOPRIM) 100 MG tablet   . aspirin EC 81 MG tablet Take 81 mg by mouth daily.  Marland Kitchen diltiazem (CARDIZEM CD) 180 MG 24 hr capsule Take 1 capsule by mouth daily.  Marland Kitchen docusate sodium (COLACE) 100 MG capsule Take 100 mg by mouth 2 (two) times daily.  . furosemide (LASIX) 20 MG tablet Take 1 tablet by mouth daily.  . indomethacin (INDOCIN) 50 MG capsule   . lisinopril  (PRINIVIL,ZESTRIL) 5 MG tablet Take 1 tablet (5 mg total) daily by mouth.  . metoprolol succinate (TOPROL-XL) 50 MG 24 hr tablet Take 1 and 1/2 tablets daily  . Multiple Vitamin (MULTIVITAMIN WITH MINERALS) TABS tablet Take 1 tablet by mouth daily.  Marland Kitchen omeprazole (PRILOSEC) 20 MG capsule Take 20 mg by mouth 2 (two) times daily before a meal.  . polyethylene glycol (MIRALAX / GLYCOLAX) packet Take 17 g by mouth 2 (two) times daily.     Allergies:   Patient has no known allergies.   Social History   Socioeconomic History  . Marital status: Married    Spouse name: None  . Number of children: None  . Years of education: None  . Highest education level: None  Social Needs  . Financial resource strain: None  . Food insecurity - worry: None  . Food insecurity - inability: None  . Transportation needs - medical: None  . Transportation needs - non-medical: None  Occupational History  . None  Tobacco Use  . Smoking status: Never Smoker  . Smokeless tobacco: Never Used  Substance and Sexual Activity  . Alcohol use: Yes    Comment: occasional/rare  . Drug use: No  . Sexual activity: None  Other Topics Concern  . None  Social History Narrative  . None     Family History: The patient's family history includes CAD in his brother and father; Colon cancer in his mother; Congestive Heart Failure in his brother and father; Diabetes in his unknown relative; Hypertension in his unknown relative. ROS:   Please see the history of present illness.    All 14 point review of systems negative except as described per history of present illness  EKGs/Labs/Other Studies Reviewed:      Recent Labs: 06/15/2017: BUN 14; Creatinine, Ser 0.91; Potassium 4.1; Sodium 140  Recent Lipid Panel    Component Value Date/Time   CHOL 190 06/15/2017 1711   TRIG 193 (H) 06/15/2017 1711   HDL 44 06/15/2017 1711   CHOLHDL 4.3 06/15/2017 1711   LDLCALC 107 (H) 06/15/2017 1711    Physical Exam:    VS:  BP  138/80 (BP Location: Right Arm, Patient Position: Sitting, Cuff Size: Normal)   Pulse 68   Ht 6\' 3"  (1.905 m)   Wt 223 lb 6.4 oz (101.3 kg)   SpO2 95%   BMI 27.92 kg/m     Wt Readings from Last 3 Encounters:  12/07/17 223 lb 6.4 oz (101.3 kg)  08/26/17 222 lb (100.7 kg)  06/15/17 219 lb 12.8 oz (99.7 kg)     GEN:  Well nourished, well developed in no acute distress HEENT: Normal NECK: No JVD; No carotid bruits LYMPHATICS: No lymphadenopathy CARDIAC: RRR, no murmurs, no rubs, no gallops RESPIRATORY:  Clear to auscultation without rales, wheezing or rhonchi  ABDOMEN: Soft, non-tender, non-distended MUSCULOSKELETAL:  No edema; No deformity  SKIN: Warm and dry LOWER EXTREMITIES: no swelling NEUROLOGIC:  Alert and oriented x 3 PSYCHIATRIC:  Normal affect   ASSESSMENT:    1. Dilated cardiomyopathy ([place])   2. Essential hypertension, benign   3. Obstructive sleep apnea   4. Pulmonary hypertension ([place])    PLAN:    In order of problems listed above:  1. Cardia myopathy: Last echocardiogram showed low normal ejection fraction.  We will continue present medications. 2. Social hypertension: He brought blood pressure measurements for me and there was a good we will continue present management 3. Obstructive sleep apnea: This being managed by internal medicine team 4. Pulmonary hypertension: Stable.  Overall he is doing fine we will continue present management I will check his Chem-7 since he is taking high dose of lisinopril and I will see him back in my office in 5 months   Medication Adjustments/Labs and Tests Ordered: Current medicines are reviewed at length with the patient today.  Concerns regarding medicines are outlined above.  No orders of the defined types were placed in this encounter.  Medication changes: No orders of the defined types were placed in this encounter.   Signed, Park Liter, MD, [facility] 12/07/2017 4:17 PM    [facility]

## 2017-12-08 ENCOUNTER — Telehealth: Payer: Self-pay

## 2017-12-08 LAB — BASIC METABOLIC PANEL
BUN/Creatinine Ratio: 13 (ref 10–24)
BUN: 13 mg/dL (ref 8–27)
CALCIUM: 9.4 mg/dL (ref 8.6–10.2)
CHLORIDE: 102 mmol/L (ref 96–106)
CO2: 24 mmol/L (ref 20–29)
Creatinine, Ser: 0.99 mg/dL (ref 0.76–1.27)
GFR calc Af Amer: 86 mL/min/{1.73_m2} (ref >59)
GFR, EST NON AFRICAN AMERICAN: 75 mL/min/{1.73_m2} (ref >59)
Glucose: 95 mg/dL (ref 65–99)
Potassium: 4.7 mmol/L (ref 3.5–5.2)
Sodium: 142 mmol/L (ref 134–144)

## 2017-12-08 LAB — PSA: Prostate Specific Ag, Serum: 3 ng/mL (ref 0.0–4.0)

## 2017-12-08 NOTE — Telephone Encounter (Signed)
-----   Message from Park Liter, MD sent at 12/08/2017  9:30 AM EST ----- Labs are good, PSA normal - 3

## 2017-12-30 ENCOUNTER — Other Ambulatory Visit: Payer: Self-pay

## 2017-12-30 MED ORDER — DILTIAZEM HCL ER COATED BEADS 180 MG PO CP24: 180.0000 mg | ORAL_CAPSULE | Freq: Every day | ORAL | 1 refills | Status: DC

## 2018-01-10 DIAGNOSIS — G4733 Obstructive sleep apnea (adult) (pediatric): Secondary | ICD-10-CM | POA: Diagnosis not present

## 2018-01-11 DIAGNOSIS — R5383 Other fatigue: Secondary | ICD-10-CM | POA: Diagnosis not present

## 2018-01-11 DIAGNOSIS — R202 Paresthesia of skin: Secondary | ICD-10-CM | POA: Diagnosis not present

## 2018-01-11 DIAGNOSIS — E559 Vitamin D deficiency, unspecified: Secondary | ICD-10-CM | POA: Diagnosis not present

## 2018-01-11 DIAGNOSIS — M25541 Pain in joints of right hand: Secondary | ICD-10-CM | POA: Diagnosis not present

## 2018-01-11 DIAGNOSIS — R800 Isolated proteinuria: Secondary | ICD-10-CM | POA: Diagnosis not present

## 2018-01-25 DIAGNOSIS — H2513 Age-related nuclear cataract, bilateral: Secondary | ICD-10-CM | POA: Diagnosis not present

## 2018-01-25 DIAGNOSIS — H52223 Regular astigmatism, bilateral: Secondary | ICD-10-CM | POA: Diagnosis not present

## 2018-01-25 DIAGNOSIS — H353121 Nonexudative age-related macular degeneration, left eye, early dry stage: Secondary | ICD-10-CM | POA: Diagnosis not present

## 2018-02-15 ENCOUNTER — Encounter: Payer: Self-pay | Admitting: Neurology

## 2018-02-18 ENCOUNTER — Other Ambulatory Visit: Payer: Self-pay | Admitting: *Deleted

## 2018-02-18 MED ORDER — METOPROLOL SUCCINATE ER 50 MG PO TB24: ORAL_TABLET | ORAL | 5 refills | Status: DC

## 2018-02-18 NOTE — Telephone Encounter (Signed)
Refill sent.

## 2018-02-22 ENCOUNTER — Ambulatory Visit: Payer: Medicare Other | Admitting: Neurology

## 2018-02-22 ENCOUNTER — Encounter: Payer: Self-pay | Admitting: Neurology

## 2018-02-22 ENCOUNTER — Ambulatory Visit (INDEPENDENT_AMBULATORY_CARE_PROVIDER_SITE_OTHER): Payer: Medicare Other | Admitting: Neurology

## 2018-02-22 DIAGNOSIS — G5623 Lesion of ulnar nerve, bilateral upper limbs: Secondary | ICD-10-CM

## 2018-02-22 DIAGNOSIS — G5620 Lesion of ulnar nerve, unspecified upper limb: Secondary | ICD-10-CM

## 2018-02-22 DIAGNOSIS — M5412 Radiculopathy, cervical region: Secondary | ICD-10-CM

## 2018-02-22 DIAGNOSIS — G5603 Carpal tunnel syndrome, bilateral upper limbs: Secondary | ICD-10-CM | POA: Diagnosis not present

## 2018-02-22 HISTORY — DX: Carpal tunnel syndrome, bilateral upper limbs: G56.03

## 2018-02-22 HISTORY — DX: Radiculopathy, cervical region: M54.12

## 2018-02-22 HISTORY — DX: Lesion of ulnar nerve, unspecified upper limb: G56.20

## 2018-02-22 NOTE — Progress Notes (Signed)
Please refer to EMG and nerve conduction study procedure note. 

## 2018-02-22 NOTE — Procedures (Signed)
HISTORY:  Adam Bennett is a 77 year old gentleman with a history of gout and arthritis affecting both elbows.  Over the last 3 to 4 years he has had increasing problems with numbness in both hands, right greater than left.  He is being evaluated for this issue.  NERVE CONDUCTION STUDIES:  Nerve conduction studies were performed on both upper extremities.  The distal motor latencies for the median nerves were prolonged bilaterally with low motor amplitudes for these nerves bilaterally.  The distal motor latencies for the ulnar nerves were prolonged on the left, normal on the right with a low motor amplitude on the left, normal on the right.  Slowing was seen for the ulnar nerves bilaterally above the elbow and below the elbow as well for the right ulnar nerve.  Slowing was seen for the median nerves bilaterally.  No response was seen for the median and ulnar sensory latencies bilaterally.  The sensory latencies for the radial nerves were normal bilaterally.  The F-wave latencies for the ulnar nerves were prolonged bilaterally.  EMG STUDIES:  EMG study was performed on the right upper extremity:  The first dorsal interosseous muscle reveals 2 to 4 K units with slightly reduced recruitment. No fibrillations or positive waves were noted. The abductor pollicis brevis muscle reveals 2 to 4 K units with reduced recruitment. No fibrillations or positive waves were noted. The extensor indicis proprius muscle reveals 1 to 3 K units with full recruitment. No fibrillations or positive waves were noted. The pronator teres muscle reveals 2 to 3 K units with full recruitment. No fibrillations or positive waves were noted. The biceps muscle reveals 1 to 2 K units with full recruitment. No fibrillations or positive waves were noted. The triceps muscle reveals 2 to 4 K units with full recruitment. No fibrillations or positive waves were noted. The anterior deltoid muscle reveals 2 to 3 K units with full  recruitment. No fibrillations or positive waves were noted. The cervical paraspinal muscles were tested at 2 levels. No abnormalities of insertional activity were seen at either level tested. There was good relaxation.  EMG study was performed on the left upper extremity:  The first dorsal interosseous muscle reveals 2 to 5 K units with decreased recruitment. No fibrillations or positive waves were noted. The abductor pollicis brevis muscle reveals 2 to 4 K units with decreased recruitment. No fibrillations or positive waves were noted. The extensor indicis proprius muscle reveals 1 to 3 K units with full recruitment. No fibrillations or positive waves were noted. The pronator teres muscle reveals 2 to 5 K units with decreased recruitment. No fibrillations or positive waves were noted. The flexor digitorum profundus muscle (III-IV) reveals 2 to 5 K motor units with decreased recruitment.  No fibrillations or positive waves were seen. The biceps muscle reveals 1 to 2 K units with full recruitment. No fibrillations or positive waves were noted. The triceps muscle reveals 2 to 7 K units with decreased recruitment. No fibrillations or positive waves were noted. The anterior deltoid muscle reveals 2 to 3 K units with full recruitment. No fibrillations or positive waves were noted. The cervical paraspinal muscles were tested at 2 levels. No abnormalities of insertional activity were seen at either level tested. There was good relaxation.   IMPRESSION:  Nerve conduction studies done on both upper extremities shows evidence of bilateral carpal tunnel syndrome of moderate severity.  There is evidence of an ulnar neuropathy at the elbow on the left of  moderate severity, mild ulnar neuropathy is seen as well on the right.  EMG evaluation of the right upper extremity shows mild chronic stable changes consistent with carpal tunnel syndrome, no evidence of an overlying cervical radiculopathy was seen.  EMG study  of the left upper extremity shows chronic stable signs of denervation consistent with a median and ulnar neuropathy, there is evidence of an overlying chronic stable C7 radiculopathy.  Jill Alexanders MD 02/22/2018 1:53 PM  [facility] [address] [place], [place] [address]  Phone [phone] Fax [phone]

## 2018-02-23 NOTE — Progress Notes (Signed)
Adam Bennett    Nerve / Sites Muscle Latency Ref. Amplitude Ref. Rel Amp Segments Distance Velocity Ref. Area    ms ms mV mV %  cm m/s m/s mVms  L Median - APB     Wrist APB 5.1 ?4.4 3.8 ?4.0 100 Wrist - APB 7   13.6     Upper arm APB 10.1  3.6  93.8 Upper arm - Wrist 23 46 ?49 12.6  R Median - APB     Wrist APB 8.3 ?4.4 2.8 ?4.0 100 Wrist - APB 7   10.1     Upper arm APB 14.0  2.5  88.5 Upper arm - Wrist 23 40 ?49 8.9  L Ulnar - ADM     Wrist ADM 3.4 ?3.3 2.6 ?6.0 100 Wrist - ADM 7   5.1     B.Elbow ADM 7.6  1.8  67.7 B.Elbow - Wrist 21 50 ?49 3.5     A.Elbow ADM 10.0  1.5  82.5 A.Elbow - B.Elbow 10 42 ?49 7.6         A.Elbow - Wrist      R Ulnar - ADM     Wrist ADM 2.9 ?3.3 7.1 ?6.0 100 Wrist - ADM 7   20.8     B.Elbow ADM 7.3  5.9  83.7 B.Elbow - Wrist 21 47 ?49 16.8     A.Elbow ADM 9.9  5.7  96.5 A.Elbow - B.Elbow 10 39 ?49 16.2         A.Elbow - Wrist                 SNC    Nerve / Sites Rec. Site Peak Lat Ref.  Amp Ref. Segments Distance    ms ms V V  cm  L Radial - Anatomical snuff box (Forearm)     Forearm Wrist 2.8 ?2.9 7 ?15 Forearm - Wrist 10  R Radial - Anatomical snuff box (Forearm)     Forearm Wrist 2.7 ?2.9 8 ?15 Forearm - Wrist 10  L Median - Orthodromic (Dig II, Mid palm)     Dig II Wrist NR ?3.4 NR ?10 Dig II - Wrist 13  R Median - Orthodromic (Dig II, Mid palm)     Dig II Wrist NR ?3.4 NR ?10 Dig II - Wrist 13  L Ulnar - Orthodromic, (Dig V, Mid palm)     Dig V Wrist NR ?3.1 NR ?5 Dig V - Wrist 11  R Ulnar - Orthodromic, (Dig V, Mid palm)     Dig V Wrist NR ?3.1 NR ?5 Dig V - Wrist 47                 F  Wave    Nerve F Lat Ref.   ms ms  L Ulnar - ADM 36.1 ?32.0  R Ulnar - ADM 37.4 ?32.0         EMG full

## 2018-03-10 DIAGNOSIS — G5603 Carpal tunnel syndrome, bilateral upper limbs: Secondary | ICD-10-CM | POA: Diagnosis not present

## 2018-03-22 ENCOUNTER — Telehealth: Payer: Self-pay | Admitting: *Deleted

## 2018-03-22 NOTE — Telephone Encounter (Signed)
Left message to return call about cardiac clearance regarding upcoming surgery.

## 2018-03-23 NOTE — Telephone Encounter (Signed)
Spoke with patient's wife, Adam Bennett, per DPR regarding patient's current status as Dr. Agustin Cree requested. Wife reports that patient is up and about with no problems, his blood pressures have been stable, and he is feeling great. Patient gets out and works in the garden and works a part-time job. Cardiac clearance signed by Dr. Agustin Cree and has been sent to [facility].

## 2018-03-25 DIAGNOSIS — I499 Cardiac arrhythmia, unspecified: Secondary | ICD-10-CM | POA: Diagnosis not present

## 2018-03-25 DIAGNOSIS — G4733 Obstructive sleep apnea (adult) (pediatric): Secondary | ICD-10-CM | POA: Diagnosis not present

## 2018-03-25 DIAGNOSIS — I420 Dilated cardiomyopathy: Secondary | ICD-10-CM | POA: Diagnosis not present

## 2018-03-25 DIAGNOSIS — I4510 Unspecified right bundle-branch block: Secondary | ICD-10-CM | POA: Diagnosis not present

## 2018-03-25 DIAGNOSIS — Z01818 Encounter for other preprocedural examination: Secondary | ICD-10-CM | POA: Diagnosis not present

## 2018-03-25 DIAGNOSIS — K219 Gastro-esophageal reflux disease without esophagitis: Secondary | ICD-10-CM | POA: Diagnosis not present

## 2018-03-25 DIAGNOSIS — M1990 Unspecified osteoarthritis, unspecified site: Secondary | ICD-10-CM | POA: Diagnosis not present

## 2018-03-25 DIAGNOSIS — Z79899 Other long term (current) drug therapy: Secondary | ICD-10-CM | POA: Diagnosis not present

## 2018-03-25 DIAGNOSIS — Z7982 Long term (current) use of aspirin: Secondary | ICD-10-CM | POA: Diagnosis not present

## 2018-03-25 DIAGNOSIS — I272 Pulmonary hypertension, unspecified: Secondary | ICD-10-CM | POA: Diagnosis not present

## 2018-03-25 DIAGNOSIS — M109 Gout, unspecified: Secondary | ICD-10-CM | POA: Diagnosis not present

## 2018-03-25 DIAGNOSIS — I4891 Unspecified atrial fibrillation: Secondary | ICD-10-CM | POA: Diagnosis not present

## 2018-03-25 DIAGNOSIS — G5601 Carpal tunnel syndrome, right upper limb: Secondary | ICD-10-CM | POA: Diagnosis not present

## 2018-03-25 DIAGNOSIS — I119 Hypertensive heart disease without heart failure: Secondary | ICD-10-CM | POA: Diagnosis not present

## 2018-03-25 DIAGNOSIS — R9431 Abnormal electrocardiogram [ECG] [EKG]: Secondary | ICD-10-CM | POA: Diagnosis not present

## 2018-03-25 DIAGNOSIS — I10 Essential (primary) hypertension: Secondary | ICD-10-CM | POA: Diagnosis not present

## 2018-03-28 ENCOUNTER — Other Ambulatory Visit: Payer: Self-pay | Admitting: *Deleted

## 2018-03-28 MED ORDER — LISINOPRIL 5 MG PO TABS: 5.0000 mg | ORAL_TABLET | Freq: Every day | ORAL | 7 refills | Status: DC

## 2018-03-28 NOTE — Telephone Encounter (Signed)
Refill for lisinopril sent to [facility].

## 2018-04-12 DIAGNOSIS — G4733 Obstructive sleep apnea (adult) (pediatric): Secondary | ICD-10-CM | POA: Diagnosis not present

## 2018-05-09 ENCOUNTER — Ambulatory Visit: Payer: Self-pay | Admitting: Cardiology

## 2018-05-18 ENCOUNTER — Ambulatory Visit (INDEPENDENT_AMBULATORY_CARE_PROVIDER_SITE_OTHER): Payer: Medicare Other | Admitting: Cardiology

## 2018-05-18 ENCOUNTER — Encounter: Payer: Self-pay | Admitting: Cardiology

## 2018-05-18 VITALS — BP 160/94 | HR 71 | Ht 75.0 in | Wt 222.4 lb

## 2018-05-18 DIAGNOSIS — I272 Pulmonary hypertension, unspecified: Secondary | ICD-10-CM

## 2018-05-18 DIAGNOSIS — I480 Paroxysmal atrial fibrillation: Secondary | ICD-10-CM

## 2018-05-18 DIAGNOSIS — I10 Essential (primary) hypertension: Secondary | ICD-10-CM

## 2018-05-18 DIAGNOSIS — I420 Dilated cardiomyopathy: Secondary | ICD-10-CM

## 2018-05-18 NOTE — Progress Notes (Signed)
Cardiology Office Note:    Date:  05/18/2018   ID:  ESTER Bennett, DOB [birth year], MRN [id number]  PCP:  [name], MD  Cardiologist:  Jenne Campus, MD    Referring MD: [name], MD   Chief Complaint  Patient presents with  . Follow-up  Doing well  History of Present Illness:    Adam Bennett is a 77 y.o. male with paroxysmal atrial fibrillation but only one documented episode after surgery, cardiomyopathy with latest estimation of left ventricle ejection fraction of 50% last year, essential hypertension sleep apnea.  Adam Bennett is doing well denies have any chest pain tightness squeezing pressure burning chest.  He works hard and have no difficulty doing it.  He tries to exercise on a regular basis by walking however admits that does not have much time to do that he denies having any palpitations  Past Medical History:  Diagnosis Date  . A-fib ([place])   . Bilateral carpal tunnel syndrome 02/22/2018  . Chronic sinus complaints   . Dilated cardiomyopathy ([place])   . GERD (gastroesophageal reflux disease)   . Hypertension   . NSVT (nonsustained ventricular tachycardia) ([place])   . Ulnar neuropathy at elbow 02/22/2018   Bilateral    Past Surgical History:  Procedure Laterality Date  . BACK SURGERY    . CARDIAC CATHETERIZATION     DONE IN [place], 43YRS AGO  . CHOLECYSTECTOMY    . FOOT SURGERY    . HEMORRHOID SURGERY    . HERNIA REPAIR    . JOINT REPLACEMENT     BILATER KNEE REPLACEMENTS  . TRANSURETHRAL RESECTION OF PROSTATE      Current Medications: Current Meds  Medication Sig  . albuterol (PROAIR HFA) 108 (90 Base) MCG/ACT inhaler Inhale 1 puff into the lungs as needed for wheezing or shortness of breath.  . allopurinol (ZYLOPRIM) 100 MG tablet   . aspirin EC 81 MG tablet Take 81 mg by mouth daily.  Marland Kitchen diltiazem (CARDIZEM CD) 180 MG 24 hr capsule Take 1 capsule (180 mg total) by mouth daily.  Marland Kitchen docusate sodium (COLACE) 100 MG capsule Take 100 mg by mouth 2 (two) times  daily.  . furosemide (LASIX) 20 MG tablet Take 1 tablet by mouth daily.  . indomethacin (INDOCIN) 50 MG capsule   . lisinopril (PRINIVIL,ZESTRIL) 5 MG tablet Take 1 tablet (5 mg total) by mouth daily.  . metoprolol succinate (TOPROL-XL) 50 MG 24 hr tablet Take 1 and 1/2 tablets daily  . Multiple Vitamin (MULTIVITAMIN WITH MINERALS) TABS tablet Take 1 tablet by mouth daily.  Marland Kitchen omeprazole (PRILOSEC) 20 MG capsule Take 20 mg by mouth 2 (two) times daily before a meal.  . polyethylene glycol (MIRALAX / GLYCOLAX) packet Take 17 g by mouth 2 (two) times daily.     Allergies:   Patient has no known allergies.   Social History   Socioeconomic History  . Marital status: Married    Spouse name: Not on file  . Number of children: Not on file  . Years of education: Not on file  . Highest education level: Not on file  Occupational History  . Not on file  Social Needs  . Financial resource strain: Not on file  . Food insecurity:    Worry: Not on file    Inability: Not on file  . Transportation needs:    Medical: Not on file    Non-medical: Not on file  Tobacco Use  . Smoking status: Never Smoker  .  Smokeless tobacco: Never Used  Substance and Sexual Activity  . Alcohol use: Yes    Comment: occasional/rare  . Drug use: No  . Sexual activity: Not on file  Lifestyle  . Physical activity:    Days per week: Not on file    Minutes per session: Not on file  . Stress: Not on file  Relationships  . Social connections:    Talks on phone: Not on file    Gets together: Not on file    Attends religious service: Not on file    Active member of club or organization: Not on file    Attends meetings of clubs or organizations: Not on file    Relationship status: Not on file  Other Topics Concern  . Not on file  Social History Narrative  . Not on file     Family History: The patient's family history includes CAD in his brother and father; Colon cancer in his mother; Congestive Heart Failure  in his brother and father; Diabetes in his unknown relative; Hypertension in his unknown relative. ROS:   Please see the history of present illness.    All 14 point review of systems negative except as described per history of present illness  EKGs/Labs/Other Studies Reviewed:      Recent Labs: 12/07/2017: BUN 13; Creatinine, Ser 0.99; Potassium 4.7; Sodium 142  Recent Lipid Panel    Component Value Date/Time   CHOL 190 06/15/2017 1711   TRIG 193 (H) 06/15/2017 1711   HDL 44 06/15/2017 1711   CHOLHDL 4.3 06/15/2017 1711   LDLCALC 107 (H) 06/15/2017 1711    Physical Exam:    VS:  BP (!) 160/94   Pulse 71   Ht 6\' 3"  (1.905 m)   Wt 222 lb 6.4 oz (100.9 kg)   SpO2 95%   BMI 27.80 kg/m     Wt Readings from Last 3 Encounters:  05/18/18 222 lb 6.4 oz (100.9 kg)  12/07/17 223 lb 6.4 oz (101.3 kg)  08/26/17 222 lb (100.7 kg)     GEN:  Well nourished, well developed in no acute distress HEENT: Normal NECK: No JVD; No carotid bruits LYMPHATICS: No lymphadenopathy CARDIAC: RRR, no murmurs, no rubs, no gallops RESPIRATORY:  Clear to auscultation without rales, wheezing or rhonchi  ABDOMEN: Soft, non-tender, non-distended MUSCULOSKELETAL:  No edema; No deformity  SKIN: Warm and dry LOWER EXTREMITIES: no swelling NEUROLOGIC:  Alert and oriented x 3 PSYCHIATRIC:  Normal affect   ASSESSMENT:    1. Dilated cardiomyopathy ([place])   2. Essential hypertension, benign   3. Essential hypertension   4. Pulmonary hypertension (HCC)   5. Paroxysmal atrial fibrillation (HCC)    PLAN:    In order of problems listed above:  1. Dilated cardiomyopathy last echocardiogram showed ejection fraction 50% we will continue present management.  Next time in 6 months we will repeat echocardiogram. 2. Essential hypertension blood pressure well controlled continue present management. 3. Pulmonary hypertension stable as per last echocardiogram again next 6 months we will repeat  echocardiogram. 4. Paroxysmal atrial fibrillation only one documented episode after surgery event recorder negative after that therefore we elected to simply watching it.   Medication Adjustments/Labs and Tests Ordered: Current medicines are reviewed at length with the patient today.  Concerns regarding medicines are outlined above.  No orders of the defined types were placed in this encounter.  Medication changes: No orders of the defined types were placed in this encounter.   Signed, Park Liter,  MD, [facility] 05/18/2018 4:56 PM    [facility]

## 2018-05-18 NOTE — Patient Instructions (Signed)
Medication Instructions:  Your physician recommends that you continue on your current medications as directed. Please refer to the Current Medication list given to you today.  Labwork: None  Testing/Procedures: None  Follow-Up: Your physician recommends that you schedule a follow-up appointment in: 5 months  Any Other Special Instructions Will Be Listed Below (If Applicable).     If you need a refill on your cardiac medications before your next appointment, please call your pharmacy.   [facility]  Ashley A, RN, BSN  

## 2018-05-20 ENCOUNTER — Other Ambulatory Visit: Payer: Self-pay

## 2018-05-24 DIAGNOSIS — S99921A Unspecified injury of right foot, initial encounter: Secondary | ICD-10-CM | POA: Diagnosis not present

## 2018-05-24 DIAGNOSIS — M2021 Hallux rigidus, right foot: Secondary | ICD-10-CM | POA: Diagnosis not present

## 2018-07-07 ENCOUNTER — Other Ambulatory Visit: Payer: Self-pay | Admitting: Emergency Medicine

## 2018-07-07 MED ORDER — DILTIAZEM HCL ER COATED BEADS 180 MG PO CP24: 180.0000 mg | ORAL_CAPSULE | Freq: Every day | ORAL | 1 refills | Status: DC

## 2018-07-07 NOTE — Telephone Encounter (Signed)
Medication refilled Diltiazem 180 mg tablet.

## 2018-07-11 DIAGNOSIS — G4733 Obstructive sleep apnea (adult) (pediatric): Secondary | ICD-10-CM | POA: Diagnosis not present

## 2018-07-28 DIAGNOSIS — H353121 Nonexudative age-related macular degeneration, left eye, early dry stage: Secondary | ICD-10-CM | POA: Diagnosis not present

## 2018-07-28 DIAGNOSIS — H2513 Age-related nuclear cataract, bilateral: Secondary | ICD-10-CM | POA: Diagnosis not present

## 2018-08-10 DIAGNOSIS — H25812 Combined forms of age-related cataract, left eye: Secondary | ICD-10-CM | POA: Diagnosis not present

## 2018-08-10 DIAGNOSIS — Z01818 Encounter for other preprocedural examination: Secondary | ICD-10-CM | POA: Diagnosis not present

## 2018-08-10 DIAGNOSIS — H353122 Nonexudative age-related macular degeneration, left eye, intermediate dry stage: Secondary | ICD-10-CM | POA: Diagnosis not present

## 2018-08-10 DIAGNOSIS — G4733 Obstructive sleep apnea (adult) (pediatric): Secondary | ICD-10-CM | POA: Diagnosis not present

## 2018-08-10 DIAGNOSIS — H2512 Age-related nuclear cataract, left eye: Secondary | ICD-10-CM | POA: Diagnosis not present

## 2018-08-29 ENCOUNTER — Telehealth: Payer: Self-pay | Admitting: Cardiology

## 2018-08-29 ENCOUNTER — Other Ambulatory Visit: Payer: Self-pay

## 2018-08-29 MED ORDER — METOPROLOL SUCCINATE ER 50 MG PO TB24: ORAL_TABLET | ORAL | 3 refills | Status: DC

## 2018-08-29 NOTE — Telephone Encounter (Signed)
°*  STAT* If patient is at the pharmacy, call can be transferred to refill team.   1. Which medications need to be refilled? (please list name of each medication and dose if known) Metoprolol 1 1/2 daily 50mg    2. Which pharmacy/location (including street and city if local pharmacy) is medication to be sent to?[place]  3. Do they need a 30 day or 90 day supply? [place]!! Please call today.

## 2018-08-29 NOTE — Telephone Encounter (Signed)
Metoprolol succinate 50 mg 1 and 1/2 tablet daily refilled

## 2018-09-08 ENCOUNTER — Telehealth: Payer: Self-pay | Admitting: Gastroenterology

## 2018-09-08 NOTE — Telephone Encounter (Signed)
Ive put this on Dr Leland Her desk for review.

## 2018-09-09 DIAGNOSIS — G4733 Obstructive sleep apnea (adult) (pediatric): Secondary | ICD-10-CM | POA: Diagnosis not present

## 2018-09-26 DIAGNOSIS — A0839 Other viral enteritis: Secondary | ICD-10-CM | POA: Diagnosis not present

## 2018-09-26 DIAGNOSIS — R5383 Other fatigue: Secondary | ICD-10-CM | POA: Diagnosis not present

## 2018-10-11 DIAGNOSIS — G4733 Obstructive sleep apnea (adult) (pediatric): Secondary | ICD-10-CM | POA: Diagnosis not present

## 2018-10-17 ENCOUNTER — Ambulatory Visit: Payer: Self-pay | Admitting: Cardiology

## 2018-10-27 DIAGNOSIS — S335XXA Sprain of ligaments of lumbar spine, initial encounter: Secondary | ICD-10-CM | POA: Diagnosis not present

## 2018-11-04 ENCOUNTER — Encounter: Payer: Self-pay | Admitting: Cardiology

## 2018-11-04 ENCOUNTER — Ambulatory Visit: Payer: Medicare Other | Admitting: Cardiology

## 2018-11-04 VITALS — BP 134/68 | HR 62 | Ht 75.0 in | Wt 215.8 lb

## 2018-11-04 DIAGNOSIS — I10 Essential (primary) hypertension: Secondary | ICD-10-CM

## 2018-11-04 DIAGNOSIS — I420 Dilated cardiomyopathy: Secondary | ICD-10-CM

## 2018-11-04 DIAGNOSIS — I272 Pulmonary hypertension, unspecified: Secondary | ICD-10-CM | POA: Diagnosis not present

## 2018-11-04 NOTE — Addendum Note (Signed)
Addended by: Aleatha Borer on: 11/04/2018 04:24 PM   Modules accepted: Orders

## 2018-11-04 NOTE — Progress Notes (Signed)
Cardiology Office Note:    Date:  11/04/2018   ID:  [name], DOB [birth year], MRN [id number]  PCP:  [name], MD  Cardiologist:  Jenne Campus, MD    Referring MD: [name], MD   Chief Complaint  Patient presents with  . Follow-up  Doing well cardiac wise  History of Present Illness:    Adam Bennett is a 78 y.o. male with history of cardiomyopathy with normalization of left ventricular ejection fraction also mild pulmonary hypertension.  He does have sleep apnea he use CPAP mask and he is very satisfied with it denies having palpitation tightness squeezing pressure burning chest.  The biggest problem he has is probably back that required multiple surgical intervention in the matter-of-fact he has some problem right now and tomorrow he is going to have MRI of his back done.  Past Medical History:  Diagnosis Date  . A-fib ([place])   . Bilateral carpal tunnel syndrome 02/22/2018  . Chronic sinus complaints   . Dilated cardiomyopathy ([place])   . GERD (gastroesophageal reflux disease)   . Hypertension   . NSVT (nonsustained ventricular tachycardia) ([place])   . Ulnar neuropathy at elbow 02/22/2018   Bilateral    Past Surgical History:  Procedure Laterality Date  . BACK SURGERY    . CARDIAC CATHETERIZATION     DONE IN [place], 39YRS AGO  . CHOLECYSTECTOMY    . FOOT SURGERY    . HEMORRHOID SURGERY    . HERNIA REPAIR    . JOINT REPLACEMENT     BILATER KNEE REPLACEMENTS  . TRANSURETHRAL RESECTION OF PROSTATE      Current Medications: Current Meds  Medication Sig  . albuterol (PROAIR HFA) 108 (90 Base) MCG/ACT inhaler Inhale 1 puff into the lungs as needed for wheezing or shortness of breath.  . allopurinol (ZYLOPRIM) 100 MG tablet Take 1 tablet by mouth daily.  Marland Kitchen aspirin EC 81 MG tablet Take 81 mg by mouth daily.  Marland Kitchen diltiazem (CARDIZEM CD) 180 MG 24 hr capsule Take 1 capsule (180 mg total) by mouth daily.  . furosemide (LASIX) 20 MG tablet Take 1 tablet by mouth as  needed.   . indomethacin (INDOCIN) 50 MG capsule   . lisinopril (PRINIVIL,ZESTRIL) 10 MG tablet Take 10 mg by mouth daily.  . metoprolol succinate (TOPROL-XL) 50 MG 24 hr tablet Take 1 and 1/2 tablets daily  . Multiple Vitamin (MULTIVITAMIN WITH MINERALS) TABS tablet Take 1 tablet by mouth daily.  Marland Kitchen omeprazole (PRILOSEC) 20 MG capsule Take 20 mg by mouth 2 (two) times daily before a meal.  . polyethylene glycol (MIRALAX / GLYCOLAX) packet Take 17 g by mouth 2 (two) times daily.     Allergies:   Patient has no known allergies.   Social History   Socioeconomic History  . Marital status: Married    Spouse name: Not on file  . Number of children: Not on file  . Years of education: Not on file  . Highest education level: Not on file  Occupational History  . Not on file  Social Needs  . Financial resource strain: Not on file  . Food insecurity:    Worry: Not on file    Inability: Not on file  . Transportation needs:    Medical: Not on file    Non-medical: Not on file  Tobacco Use  . Smoking status: Never Smoker  . Smokeless tobacco: Never Used  Substance and Sexual Activity  . Alcohol use: Yes  Comment: occasional/rare  . Drug use: No  . Sexual activity: Not on file  Lifestyle  . Physical activity:    Days per week: Not on file    Minutes per session: Not on file  . Stress: Not on file  Relationships  . Social connections:    Talks on phone: Not on file    Gets together: Not on file    Attends religious service: Not on file    Active member of club or organization: Not on file    Attends meetings of clubs or organizations: Not on file    Relationship status: Not on file  Other Topics Concern  . Not on file  Social History Narrative  . Not on file     Family History: The patient's family history includes CAD in his brother and father; Colon cancer in his mother; Congestive Heart Failure in his brother and father; Diabetes in his unknown relative; Hypertension in  his unknown relative. ROS:   Please see the history of present illness.    All 14 point review of systems negative except as described per history of present illness  EKGs/Labs/Other Studies Reviewed:      Recent Labs: 12/07/2017: BUN 13; Creatinine, Ser 0.99; Potassium 4.7; Sodium 142  Recent Lipid Panel    Component Value Date/Time   CHOL 190 06/15/2017 1711   TRIG 193 (H) 06/15/2017 1711   HDL 44 06/15/2017 1711   CHOLHDL 4.3 06/15/2017 1711   LDLCALC 107 (H) 06/15/2017 1711    Physical Exam:    VS:  BP 134/68   Pulse 62   Ht 6\' 3"  (1.905 m)   Wt 215 lb 12.8 oz (97.9 kg)   SpO2 98%   BMI 26.97 kg/m     Wt Readings from Last 3 Encounters:  11/04/18 215 lb 12.8 oz (97.9 kg)  05/18/18 222 lb 6.4 oz (100.9 kg)  12/07/17 223 lb 6.4 oz (101.3 kg)     GEN:  Well nourished, well developed in no acute distress HEENT: Normal NECK: No JVD; No carotid bruits LYMPHATICS: No lymphadenopathy CARDIAC: RRR, no murmurs, no rubs, no gallops RESPIRATORY:  Clear to auscultation without rales, wheezing or rhonchi  ABDOMEN: Soft, non-tender, non-distended MUSCULOSKELETAL:  No edema; No deformity  SKIN: Warm and dry LOWER EXTREMITIES: no swelling NEUROLOGIC:  Alert and oriented x 3 PSYCHIATRIC:  Normal affect   ASSESSMENT:    1. Dilated cardiomyopathy ([place])   2. Essential hypertension, benign   3. Pulmonary hypertension ([place])    PLAN:    In order of problems listed above:  1. History of dilated cardiomyopathy will repeat echocardiogram to check left ventricular ejection fraction 2. Essential hypertension doing well from that point review we will continue present management. 3. Pulmonary hypertension echocardiogram to be done to assess the issue. 4. History of paroxysmal atrial fibrillation but that happened only after surgery since that time no more episodes.   Medication Adjustments/Labs and Tests Ordered: Current medicines are reviewed at length with the patient today.   Concerns regarding medicines are outlined above.  No orders of the defined types were placed in this encounter.  Medication changes: No orders of the defined types were placed in this encounter.   Signed, Park Liter, MD, [facility] 11/04/2018 4:17 PM    [place]

## 2018-11-04 NOTE — Patient Instructions (Signed)
Medication Instructions:  Your physician recommends that you continue on your current medications as directed. Please refer to the Current Medication list given to you today.  If you need a refill on your cardiac medications before your next appointment, please call your pharmacy.   Lab work: None ordered If you have labs (blood work) drawn today and your tests are completely normal, you will receive your results only by: . MyChart Message (if you have MyChart) OR . A paper copy in the mail If you have any lab test that is abnormal or we need to change your treatment, we will call you to review the results.  Testing/Procedures: Your physician has requested that you have an echocardiogram. Echocardiography is a painless test that uses sound waves to create images of your heart. It provides your doctor with information about the size and shape of your heart and how well your heart's chambers and valves are working. This procedure takes approximately one hour. There are no restrictions for this procedure.  Follow-Up: At [facility], you and your health needs are our priority.  As part of our continuing mission to provide you with exceptional heart care, we have created designated Provider Care Teams.  These Care Teams include your primary Cardiologist (physician) and Advanced Practice Providers (APPs -  Physician Assistants and Nurse Practitioners) who all work together to provide you with the care you need, when you need it. You will need a follow up appointment in 5 months.  Please call our office 2 months in advance to schedule this appointment.  You may see Robert Krasowski, MD or another member of our [facility] Provider Team in [place]: Brian Munley, MD . Rajan Revankar, MD  Any Other Special Instructions Will Be Listed Below (If Applicable).    

## 2018-11-04 NOTE — Addendum Note (Signed)
Addended by: Aleatha Borer on: 11/04/2018 04:30 PM   Modules accepted: Orders

## 2018-11-05 DIAGNOSIS — M48061 Spinal stenosis, lumbar region without neurogenic claudication: Secondary | ICD-10-CM | POA: Diagnosis not present

## 2018-11-05 DIAGNOSIS — M545 Low back pain: Secondary | ICD-10-CM | POA: Diagnosis not present

## 2018-11-05 DIAGNOSIS — M5117 Intervertebral disc disorders with radiculopathy, lumbosacral region: Secondary | ICD-10-CM | POA: Diagnosis not present

## 2018-11-05 LAB — LIPID PANEL
CHOL/HDL RATIO: 4.3 ratio (ref 0.0–5.0)
Cholesterol, Total: 196 mg/dL (ref 100–199)
HDL: 46 mg/dL (ref >39)
LDL CALC: 115 mg/dL — AB (ref 0–99)
TRIGLYCERIDES: 174 mg/dL — AB (ref 0–149)
VLDL Cholesterol Cal: 35 mg/dL (ref 5–40)

## 2018-11-07 ENCOUNTER — Telehealth: Payer: Self-pay | Admitting: Emergency Medicine

## 2018-11-07 NOTE — Telephone Encounter (Signed)
Patient's wife informed of results, they would like to try dieting first. Will consult with Dr. Agustin Cree

## 2018-11-10 DIAGNOSIS — G4733 Obstructive sleep apnea (adult) (pediatric): Secondary | ICD-10-CM | POA: Diagnosis not present

## 2018-11-23 DIAGNOSIS — E782 Mixed hyperlipidemia: Secondary | ICD-10-CM | POA: Diagnosis not present

## 2018-11-23 DIAGNOSIS — M10072 Idiopathic gout, left ankle and foot: Secondary | ICD-10-CM | POA: Diagnosis not present

## 2018-11-23 DIAGNOSIS — M109 Gout, unspecified: Secondary | ICD-10-CM | POA: Diagnosis not present

## 2018-11-23 DIAGNOSIS — K219 Gastro-esophageal reflux disease without esophagitis: Secondary | ICD-10-CM | POA: Diagnosis not present

## 2018-11-23 DIAGNOSIS — I10 Essential (primary) hypertension: Secondary | ICD-10-CM | POA: Diagnosis not present

## 2018-11-24 ENCOUNTER — Other Ambulatory Visit: Payer: Self-pay | Admitting: Emergency Medicine

## 2018-11-24 DIAGNOSIS — I10 Essential (primary) hypertension: Secondary | ICD-10-CM

## 2018-11-24 NOTE — Telephone Encounter (Signed)
Left message for patient to return call.

## 2018-11-24 NOTE — Telephone Encounter (Signed)
Please check his chem7  Favor 10 mg rather than 5 but needs to check creat first

## 2018-11-24 NOTE — Telephone Encounter (Signed)
Please see routed message to Dr. Agustin Cree

## 2018-11-25 ENCOUNTER — Telehealth: Payer: Self-pay | Admitting: *Deleted

## 2018-11-25 MED ORDER — LISINOPRIL 10 MG PO TABS: 10.0000 mg | ORAL_TABLET | Freq: Every day | ORAL | 1 refills | Status: DC

## 2018-11-25 MED ORDER — DILTIAZEM HCL ER COATED BEADS 180 MG PO CP24: 180.0000 mg | ORAL_CAPSULE | Freq: Every day | ORAL | 1 refills | Status: DC

## 2018-11-25 NOTE — Telephone Encounter (Signed)
*  STAT* If patient is at the pharmacy, call can be transferred to refill team.   1. Which medications need to be refilled? (please list name of each medication and dose if known) Lisinopril 10 mg and Cardizem CD 180  2. Which pharmacy/location (including street and city if local pharmacy) is medication to be sent to?[place]  3. Do they need a 30 day or 90 day supply? [place]

## 2018-11-25 NOTE — Telephone Encounter (Signed)
Left message for patient to return call.

## 2018-11-28 ENCOUNTER — Encounter: Payer: Self-pay | Admitting: Cardiology

## 2018-11-28 NOTE — Telephone Encounter (Signed)
Wife called back. I informed her that we need lab work to confirm which dose of lisinopril per Dr. Agustin Cree. She reports Dr. Tobie Poet recently drew labs will call their office for these results as I do not see them in epic.

## 2018-11-28 NOTE — Telephone Encounter (Signed)
Requested lab work for [place] [facility] [employer] [facility]. They will fax over

## 2018-11-30 MED ORDER — LISINOPRIL 10 MG PO TABS: 10.0000 mg | ORAL_TABLET | Freq: Every day | ORAL | 1 refills | Status: DC

## 2018-11-30 MED ORDER — DILTIAZEM HCL ER COATED BEADS 180 MG PO CP24: 180.0000 mg | ORAL_CAPSULE | Freq: Every day | ORAL | 1 refills | Status: DC

## 2018-11-30 NOTE — Addendum Note (Signed)
Addended by: Aleatha Borer on: 11/30/2018 03:35 PM   Modules accepted: Orders

## 2018-11-30 NOTE — Telephone Encounter (Signed)
Patient's wife called back, she was verified that labs were received and that Julus is to increase to lisinopril 10 mg daily and have BMP in a week.

## 2018-11-30 NOTE — Telephone Encounter (Signed)
Left message for patient to return call.

## 2018-11-30 NOTE — Addendum Note (Signed)
Addended by: Aleatha Borer on: 11/30/2018 03:32 PM   Modules accepted: Orders

## 2018-12-06 DIAGNOSIS — G250 Essential tremor: Secondary | ICD-10-CM | POA: Insufficient documentation

## 2018-12-06 DIAGNOSIS — M545 Low back pain: Secondary | ICD-10-CM | POA: Diagnosis not present

## 2018-12-06 DIAGNOSIS — M48062 Spinal stenosis, lumbar region with neurogenic claudication: Secondary | ICD-10-CM | POA: Diagnosis not present

## 2018-12-06 DIAGNOSIS — M48061 Spinal stenosis, lumbar region without neurogenic claudication: Secondary | ICD-10-CM | POA: Insufficient documentation

## 2018-12-06 DIAGNOSIS — I10 Essential (primary) hypertension: Secondary | ICD-10-CM | POA: Diagnosis not present

## 2018-12-07 DIAGNOSIS — I10 Essential (primary) hypertension: Secondary | ICD-10-CM | POA: Diagnosis not present

## 2018-12-07 LAB — BASIC METABOLIC PANEL
BUN / CREAT RATIO: 15 (ref 10–24)
BUN: 13 mg/dL (ref 8–27)
CO2: 22 mmol/L (ref 20–29)
CREATININE: 0.86 mg/dL (ref 0.76–1.27)
Calcium: 9.1 mg/dL (ref 8.6–10.2)
Chloride: 102 mmol/L (ref 96–106)
GFR calc Af Amer: 98 mL/min/{1.73_m2} (ref >59)
GFR calc non Af Amer: 85 mL/min/{1.73_m2} (ref >59)
Glucose: 121 mg/dL — ABNORMAL HIGH (ref 65–99)
Potassium: 4.4 mmol/L (ref 3.5–5.2)
SODIUM: 138 mmol/L (ref 134–144)

## 2018-12-12 DIAGNOSIS — G4733 Obstructive sleep apnea (adult) (pediatric): Secondary | ICD-10-CM | POA: Diagnosis not present

## 2018-12-21 ENCOUNTER — Telehealth: Payer: Self-pay | Admitting: *Deleted

## 2018-12-21 MED ORDER — METOPROLOL SUCCINATE ER 50 MG PO TB24: ORAL_TABLET | ORAL | 1 refills | Status: DC

## 2018-12-21 MED ORDER — DILTIAZEM HCL ER COATED BEADS 180 MG PO CP24
180.0000 mg | ORAL_CAPSULE | Freq: Every day | ORAL | 1 refills | Status: DC
Start: 2018-12-21 — End: 2019-06-20

## 2018-12-21 NOTE — Telephone Encounter (Signed)
*  STAT* If patient is at the pharmacy, call can be transferred to refill team.   1. Which medications need to be refilled? (please list name of each medication and dose if known) Metoprolol 50 mg 1.5 daily, Cardizem 180mg  qd  2. Which pharmacy/location (including street and city if local pharmacy) is medication to be sent to?[place]   3. Do they need a 30 day or 90 day supply? [place]

## 2019-01-11 DIAGNOSIS — G4733 Obstructive sleep apnea (adult) (pediatric): Secondary | ICD-10-CM | POA: Diagnosis not present

## 2019-01-30 DIAGNOSIS — Z1211 Encounter for screening for malignant neoplasm of colon: Secondary | ICD-10-CM | POA: Diagnosis not present

## 2019-01-30 DIAGNOSIS — Z0000 Encounter for general adult medical examination without abnormal findings: Secondary | ICD-10-CM | POA: Diagnosis not present

## 2019-02-10 DIAGNOSIS — G4733 Obstructive sleep apnea (adult) (pediatric): Secondary | ICD-10-CM | POA: Diagnosis not present

## 2019-03-13 DIAGNOSIS — G4733 Obstructive sleep apnea (adult) (pediatric): Secondary | ICD-10-CM | POA: Diagnosis not present

## 2019-03-29 ENCOUNTER — Other Ambulatory Visit: Payer: Self-pay

## 2019-04-14 ENCOUNTER — Other Ambulatory Visit: Payer: Medicare Other

## 2019-04-26 ENCOUNTER — Ambulatory Visit: Payer: Medicare Other | Admitting: Cardiology

## 2019-05-02 NOTE — Telephone Encounter (Signed)
Did we ever get an update on this?  Proficient referral ID [id number]

## 2019-05-03 NOTE — Telephone Encounter (Signed)
This is still on Adam Bennett's desk for review.

## 2019-05-04 ENCOUNTER — Other Ambulatory Visit: Payer: Self-pay

## 2019-05-04 ENCOUNTER — Ambulatory Visit (INDEPENDENT_AMBULATORY_CARE_PROVIDER_SITE_OTHER): Payer: Medicare Other

## 2019-05-04 ENCOUNTER — Telehealth: Payer: Self-pay | Admitting: *Deleted

## 2019-05-04 ENCOUNTER — Ambulatory Visit: Payer: Medicare Other | Admitting: Cardiology

## 2019-05-04 DIAGNOSIS — I272 Pulmonary hypertension, unspecified: Secondary | ICD-10-CM | POA: Diagnosis not present

## 2019-05-04 DIAGNOSIS — I420 Dilated cardiomyopathy: Secondary | ICD-10-CM | POA: Diagnosis not present

## 2019-05-04 NOTE — Progress Notes (Signed)
Complete echocardiogram has been performed.  Jimmy Lucelia Lacey RDCS, RVT 

## 2019-05-04 NOTE — Telephone Encounter (Signed)
Telephone call to patient. Left message to call back regarding echo

## 2019-05-04 NOTE — Telephone Encounter (Signed)
Patient wife called back . Informed of normal echo results.

## 2019-05-04 NOTE — Telephone Encounter (Signed)
-----   Message from Park Liter, MD sent at 05/04/2019  1:06 PM EDT ----- Normal lvef  [facility]

## 2019-05-05 ENCOUNTER — Ambulatory Visit: Payer: Medicare Other | Admitting: Cardiology

## 2019-05-09 NOTE — Telephone Encounter (Signed)
Per Dr [name] Safe patient needs recall 05/2021. I have entered recall into epic. I have called patient and left a message for him to return my call.

## 2019-05-22 ENCOUNTER — Other Ambulatory Visit: Payer: Self-pay | Admitting: Emergency Medicine

## 2019-05-22 MED ORDER — LISINOPRIL 10 MG PO TABS: 10.0000 mg | ORAL_TABLET | Freq: Every day | ORAL | 1 refills | Status: DC

## 2019-05-23 ENCOUNTER — Other Ambulatory Visit: Payer: Self-pay | Admitting: Cardiology

## 2019-05-23 NOTE — Telephone Encounter (Signed)
Medication was refilled yesterday.

## 2019-05-23 NOTE — Telephone Encounter (Signed)
°*  STAT* If patient is at the pharmacy, call can be transferred to refill team.   1. Which medications need to be refilled? (please list name of each medication and dose if known) Lisinopril 10mg  tablet  2. Which pharmacy/location (including street and city if local pharmacy) is medication to be sent to?[facility]  3. Do they need a 30 day or 90 day supply? [place]

## 2019-06-08 DIAGNOSIS — G4733 Obstructive sleep apnea (adult) (pediatric): Secondary | ICD-10-CM | POA: Diagnosis not present

## 2019-06-20 ENCOUNTER — Other Ambulatory Visit: Payer: Self-pay | Admitting: Cardiology

## 2019-06-28 ENCOUNTER — Encounter: Payer: Self-pay | Admitting: Cardiology

## 2019-06-28 ENCOUNTER — Other Ambulatory Visit: Payer: Self-pay

## 2019-06-28 ENCOUNTER — Ambulatory Visit (INDEPENDENT_AMBULATORY_CARE_PROVIDER_SITE_OTHER): Payer: Medicare Other | Admitting: Cardiology

## 2019-06-28 VITALS — BP 134/76 | HR 65 | Ht 74.0 in | Wt 218.8 lb

## 2019-06-28 DIAGNOSIS — I10 Essential (primary) hypertension: Secondary | ICD-10-CM

## 2019-06-28 DIAGNOSIS — G4733 Obstructive sleep apnea (adult) (pediatric): Secondary | ICD-10-CM

## 2019-06-28 DIAGNOSIS — I420 Dilated cardiomyopathy: Secondary | ICD-10-CM

## 2019-06-28 NOTE — Patient Instructions (Signed)
Medication Instructions:  Your physician recommends that you continue on your current medications as directed. Please refer to the Current Medication list given to you today.  If you need a refill on your cardiac medications before your next appointment, please call your pharmacy.   Lab work: None.  If you have labs (blood work) drawn today and your tests are completely normal, you will receive your results only by: . MyChart Message (if you have MyChart) OR . A paper copy in the mail If you have any lab test that is abnormal or we need to change your treatment, we will call you to review the results.  Testing/Procedures: None.    Follow-Up: At [facility], you and your health needs are our priority.  As part of our continuing mission to provide you with exceptional heart care, we have created designated Provider Care Teams.  These Care Teams include your primary Cardiologist (physician) and Advanced Practice Providers (APPs -  Physician Assistants and Nurse Practitioners) who all work together to provide you with the care you need, when you need it. You will need a follow up appointment in 5 months.  Please call our office 2 months in advance to schedule this appointment.  You may see Robert Krasowski, MD or another member of our [facility] Provider Team in : Brian Munley, MD . Rajan Revankar, MD  Any Other Special Instructions Will Be Listed Below (If Applicable).     

## 2019-06-28 NOTE — Progress Notes (Signed)
Cardiology Office Note:    Date:  06/28/2019   ID:  Adam Bennett, DOB 1941-01-01, MRN [id number]  PCP:  [name], MD  Cardiologist:  Jenne Campus, MD    Referring MD: [name], MD   Chief Complaint  Patient presents with  . Follow-up  Doing very well  History of Present Illness:    Adam Bennett is a 78 y.o. male with history of cardiomyopathy, normalization of ejection fraction.  Essential hypertension obstructive sleep apnea comes today to my office for follow-up full doing very well denies have any chest pain tightness squeezing pressure burning chest he is getting ready to go to the beach and he is looking forward to it.  Past Medical History:  Diagnosis Date  . A-fib ([place])   . Bilateral carpal tunnel syndrome 02/22/2018  . Chronic sinus complaints   . Dilated cardiomyopathy ([place])   . GERD (gastroesophageal reflux disease)   . Hypertension   . NSVT (nonsustained ventricular tachycardia) ([place])   . Ulnar neuropathy at elbow 02/22/2018   Bilateral    Past Surgical History:  Procedure Laterality Date  . BACK SURGERY    . CARDIAC CATHETERIZATION     DONE IN [place], 59YRS AGO  . CHOLECYSTECTOMY    . FOOT SURGERY    . HEMORRHOID SURGERY    . HERNIA REPAIR    . JOINT REPLACEMENT     BILATER KNEE REPLACEMENTS  . TRANSURETHRAL RESECTION OF PROSTATE      Current Medications: Current Meds  Medication Sig  . albuterol (PROAIR HFA) 108 (90 Base) MCG/ACT inhaler Inhale 1 puff into the lungs as needed for wheezing or shortness of breath.  . allopurinol (ZYLOPRIM) 100 MG tablet Take 1 tablet by mouth daily.  Marland Kitchen aspirin EC 81 MG tablet Take 81 mg by mouth daily.  Marland Kitchen diltiazem (CARDIZEM CD) 180 MG 24 hr capsule TAKE ONE CAPSULE BY MOUTH EVERY DAY  . furosemide (LASIX) 20 MG tablet Take 1 tablet by mouth as needed.   . indomethacin (INDOCIN) 50 MG capsule   . lisinopril (ZESTRIL) 10 MG tablet Take 1 tablet (10 mg total) by mouth daily.  . metoprolol succinate  (TOPROL-XL) 50 MG 24 hr tablet TAKE 1& 1/2 TABLETS ONCE DAILY.  . Multiple Vitamin (MULTIVITAMIN WITH MINERALS) TABS tablet Take 1 tablet by mouth daily.  Marland Kitchen omeprazole (PRILOSEC) 20 MG capsule Take 20 mg by mouth 2 (two) times daily before a meal.  . polyethylene glycol (MIRALAX / GLYCOLAX) packet Take 17 g by mouth 2 (two) times daily.     Allergies:   Patient has no known allergies.   Social History   Socioeconomic History  . Marital status: Married    Spouse name: Not on file  . Number of children: Not on file  . Years of education: Not on file  . Highest education level: Not on file  Occupational History  . Not on file  Social Needs  . Financial resource strain: Not on file  . Food insecurity    Worry: Not on file    Inability: Not on file  . Transportation needs    Medical: Not on file    Non-medical: Not on file  Tobacco Use  . Smoking status: Never Smoker  . Smokeless tobacco: Never Used  Substance and Sexual Activity  . Alcohol use: Yes    Comment: occasional/rare  . Drug use: No  . Sexual activity: Not on file  Lifestyle  . Physical activity  Days per week: Not on file    Minutes per session: Not on file  . Stress: Not on file  Relationships  . Social Herbalist on phone: Not on file    Gets together: Not on file    Attends religious service: Not on file    Active member of club or organization: Not on file    Attends meetings of clubs or organizations: Not on file    Relationship status: Not on file  Other Topics Concern  . Not on file  Social History Narrative  . Not on file     Family History: The patient's family history includes CAD in his brother and father; Colon cancer in his mother; Congestive Heart Failure in his brother and father; Diabetes in his unknown relative; Hypertension in his unknown relative. ROS:   Please see the history of present illness.    All 14 point review of systems negative except as described per history of  present illness  EKGs/Labs/Other Studies Reviewed:      Recent Labs: 12/07/2018: BUN 13; Creatinine, Ser 0.86; Potassium 4.4; Sodium 138  Recent Lipid Panel    Component Value Date/Time   CHOL 196 11/04/2018 1633   TRIG 174 (H) 11/04/2018 1633   HDL 46 11/04/2018 1633   CHOLHDL 4.3 11/04/2018 1633   LDLCALC 115 (H) 11/04/2018 1633    Physical Exam:    VS:  BP 134/76   Pulse 65   Ht 6\' 2"  (1.88 m)   Wt 218 lb 12.8 oz (99.2 kg)   SpO2 98%   BMI 28.09 kg/m     Wt Readings from Last 3 Encounters:  06/28/19 218 lb 12.8 oz (99.2 kg)  11/04/18 215 lb 12.8 oz (97.9 kg)  05/18/18 222 lb 6.4 oz (100.9 kg)     GEN:  Well nourished, well developed in no acute distress HEENT: Normal NECK: No JVD; No carotid bruits LYMPHATICS: No lymphadenopathy CARDIAC: RRR, no murmurs, no rubs, no gallops RESPIRATORY:  Clear to auscultation without rales, wheezing or rhonchi  ABDOMEN: Soft, non-tender, non-distended MUSCULOSKELETAL:  No edema; No deformity  SKIN: Warm and dry LOWER EXTREMITIES: no swelling NEUROLOGIC:  Alert and oriented x 3 PSYCHIATRIC:  Normal affect   ASSESSMENT:    1. Essential hypertension   2. Dilated cardiomyopathy ([place])   3. Obstructive sleep apnea    PLAN:    In order of problems listed above:  1. Essential hypertension blood pressure well controlled continue present management 2. Dilated cardiomyopathy with normalization of left ventricular ejection fraction continue present management 3. Obstructive sleep apnea use CPAP mask on the regular basis and he will continue   Medication Adjustments/Labs and Tests Ordered: Current medicines are reviewed at length with the patient today.  Concerns regarding medicines are outlined above.  No orders of the defined types were placed in this encounter.  Medication changes: No orders of the defined types were placed in this encounter.   Signed, Park Liter, MD, [facility] 06/28/2019 4:38 PM    [place]

## 2019-08-08 DIAGNOSIS — Z23 Encounter for immunization: Secondary | ICD-10-CM | POA: Diagnosis not present

## 2019-09-06 DIAGNOSIS — G4733 Obstructive sleep apnea (adult) (pediatric): Secondary | ICD-10-CM | POA: Diagnosis not present

## 2019-11-10 DIAGNOSIS — M10072 Idiopathic gout, left ankle and foot: Secondary | ICD-10-CM | POA: Diagnosis not present

## 2019-11-10 DIAGNOSIS — I10 Essential (primary) hypertension: Secondary | ICD-10-CM | POA: Diagnosis not present

## 2019-11-10 DIAGNOSIS — K219 Gastro-esophageal reflux disease without esophagitis: Secondary | ICD-10-CM | POA: Diagnosis not present

## 2019-11-10 DIAGNOSIS — E782 Mixed hyperlipidemia: Secondary | ICD-10-CM | POA: Diagnosis not present

## 2019-11-15 ENCOUNTER — Other Ambulatory Visit: Payer: Self-pay | Admitting: Cardiology

## 2019-11-28 ENCOUNTER — Ambulatory Visit (INDEPENDENT_AMBULATORY_CARE_PROVIDER_SITE_OTHER): Payer: Medicare Other | Admitting: Cardiology

## 2019-11-28 ENCOUNTER — Other Ambulatory Visit: Payer: Self-pay

## 2019-11-28 ENCOUNTER — Encounter: Payer: Self-pay | Admitting: Cardiology

## 2019-11-28 VITALS — BP 176/80 | HR 62 | Ht 74.0 in | Wt 220.0 lb

## 2019-11-28 DIAGNOSIS — I10 Essential (primary) hypertension: Secondary | ICD-10-CM

## 2019-11-28 DIAGNOSIS — I272 Pulmonary hypertension, unspecified: Secondary | ICD-10-CM

## 2019-11-28 DIAGNOSIS — I420 Dilated cardiomyopathy: Secondary | ICD-10-CM | POA: Diagnosis not present

## 2019-11-28 NOTE — Patient Instructions (Signed)

## 2019-11-28 NOTE — Progress Notes (Signed)
Cardiology Office Note:    Date:  11/28/2019   ID:  Adam Bennett, DOB 1941-10-13, MRN [id number]  PCP:  Adam Brome, MD  Cardiologist:  Adam Campus, MD    Referring MD: Adam Brome, MD   Chief Complaint  Patient presents with  . Follow-up    5 MO FU     History of Present Illness:    Adam Bennett is a 79 y.o. male with past medical history significant for cardiomyopathy, normalization, also 1 documented episode of atrial fibrillation when he had cardiomyopathy, essential hypertension.  Adam Bennett comes today to my office for follow-up.  Overall doing well.  Denies have any chest pain, tightness, pressure, burning in the chest.  He is having a little elevated blood pressure today, therefore, I asked him to check his blood pressure on the regular basis and let me know how it is.  Past Medical History:  Diagnosis Date  . A-fib ([place])   . Bilateral carpal tunnel syndrome 02/22/2018  . Chronic sinus complaints   . Dilated cardiomyopathy ([place])   . GERD (gastroesophageal reflux disease)   . Hypertension   . NSVT (nonsustained ventricular tachycardia) ([place])   . Ulnar neuropathy at elbow 02/22/2018   Bilateral    Past Surgical History:  Procedure Laterality Date  . BACK SURGERY    . CARDIAC CATHETERIZATION     DONE IN [place], 50YRS AGO  . CHOLECYSTECTOMY    . FOOT SURGERY    . HEMORRHOID SURGERY    . HERNIA REPAIR    . JOINT REPLACEMENT     BILATER KNEE REPLACEMENTS  . TRANSURETHRAL RESECTION OF PROSTATE      Current Medications: Current Meds  Medication Sig  . albuterol (PROAIR HFA) 108 (90 Base) MCG/ACT inhaler Inhale 1 puff into the lungs as needed for wheezing or shortness of breath.  . allopurinol (ZYLOPRIM) 100 MG tablet Take 1 tablet by mouth daily.  Marland Kitchen aspirin EC 81 MG tablet Take 81 mg by mouth daily.  Marland Kitchen diltiazem (CARDIZEM CD) 180 MG 24 hr capsule TAKE ONE CAPSULE BY MOUTH EVERY DAY  . furosemide (LASIX) 20 MG tablet Take 1 tablet by mouth as needed.   .  indomethacin (INDOCIN) 50 MG capsule   . lisinopril (ZESTRIL) 10 MG tablet TAKE 1 TABLET BY MOUTH ONCE DAILY  . metoprolol succinate (TOPROL-XL) 50 MG 24 hr tablet TAKE 1& 1/2 TABLETS ONCE DAILY.  . Multiple Vitamin (MULTIVITAMIN WITH MINERALS) TABS tablet Take 1 tablet by mouth daily.  Marland Kitchen omeprazole (PRILOSEC) 20 MG capsule Take 20 mg by mouth 2 (two) times daily before a meal.  . polyethylene glycol (MIRALAX / GLYCOLAX) packet Take 17 g by mouth 2 (two) times daily.     Allergies:   Patient has no known allergies.   Social History   Socioeconomic History  . Marital status: Married    Spouse name: Not on file  . Number of children: Not on file  . Years of education: Not on file  . Highest education level: Not on file  Occupational History  . Not on file  Tobacco Use  . Smoking status: Never Smoker  . Smokeless tobacco: Never Used  Substance and Sexual Activity  . Alcohol use: Yes    Comment: occasional/rare  . Drug use: No  . Sexual activity: Not on file  Other Topics Concern  . Not on file  Social History Narrative  . Not on file   Social Determinants of Health   Financial  Resource Strain:   . Difficulty of Paying Living Expenses: Not on file  Food Insecurity:   . Worried About Charity fundraiser in the Last Year: Not on file  . Ran Out of Food in the Last Year: Not on file  Transportation Needs:   . Lack of Transportation (Medical): Not on file  . Lack of Transportation (Non-Medical): Not on file  Physical Activity:   . Days of Exercise per Week: Not on file  . Minutes of Exercise per Session: Not on file  Stress:   . Feeling of Stress : Not on file  Social Connections:   . Frequency of Communication with Friends and Family: Not on file  . Frequency of Social Gatherings with Friends and Family: Not on file  . Attends Religious Services: Not on file  . Active Member of Clubs or Organizations: Not on file  . Attends Archivist Meetings: Not on file  .  Marital Status: Not on file     Family History: The patient's family history includes CAD in his brother and father; Colon cancer in his mother; Congestive Heart Failure in his brother and father; Diabetes in his unknown relative; Hypertension in his unknown relative. ROS:   Please see the history of present illness.    All 14 point review of systems negative except as described per history of present illness  EKGs/Labs/Other Studies Reviewed:      Recent Labs: 12/07/2018: BUN 13; Creatinine, Ser 0.86; Potassium 4.4; Sodium 138  Recent Lipid Panel    Component Value Date/Time   CHOL 196 11/04/2018 1633   TRIG 174 (H) 11/04/2018 1633   HDL 46 11/04/2018 1633   CHOLHDL 4.3 11/04/2018 1633   LDLCALC 115 (H) 11/04/2018 1633    Physical Exam:    VS:  BP (!) 176/80   Pulse 62   Ht 6\' 2"  (1.88 m)   Wt 220 lb (99.8 kg)   SpO2 96%   BMI 28.25 kg/m     Wt Readings from Last 3 Encounters:  11/28/19 220 lb (99.8 kg)  06/28/19 218 lb 12.8 oz (99.2 kg)  11/04/18 215 lb 12.8 oz (97.9 kg)     GEN:  Well nourished, well developed in no acute distress HEENT: Normal NECK: No JVD; No carotid bruits LYMPHATICS: No lymphadenopathy CARDIAC: RRR, no murmurs, no rubs, no gallops RESPIRATORY:  Clear to auscultation without rales, wheezing or rhonchi  ABDOMEN: Soft, non-tender, non-distended MUSCULOSKELETAL:  No edema; No deformity  SKIN: Warm and dry LOWER EXTREMITIES: no swelling NEUROLOGIC:  Alert and oriented x 3 PSYCHIATRIC:  Normal affect   ASSESSMENT:    1. Essential hypertension, benign   2. Pulmonary hypertension ([place])   3. Dilated cardiomyopathy ([place])    PLAN:    In order of problems listed above:  1. Essential hypertension.  Blood pressure slightly elevated plan as outlined above 2. History of pulmonary hypertension but last echocardiogram did not show that.  We will schedule him to have echocardiogram done in the summer of this year. 3. History of dilated  cardiomyopathy with normalization.  Continue present management.  Echocardiogram will be repeated 4. 1 documented episode of atrial fibrillation in 2015.  Since that time none.  He is being always very reluctant to consider anticoagulation.  Because of his age being 28 he is chads 2 Vascor equals 3.  He prefer not to be anticoagulated and we simply watching him for for signs and symptoms of atrial fibrillation.  So far does  not have any.   Medication Adjustments/Labs and Tests Ordered: Current medicines are reviewed at length with the patient today.  Concerns regarding medicines are outlined above.  No orders of the defined types were placed in this encounter.  Medication changes: No orders of the defined types were placed in this encounter.   Signed, Park Liter, MD, [facility] 11/28/2019 3:57 PM    [place]

## 2019-12-05 DIAGNOSIS — G4733 Obstructive sleep apnea (adult) (pediatric): Secondary | ICD-10-CM | POA: Diagnosis not present

## 2019-12-05 DIAGNOSIS — H1132 Conjunctival hemorrhage, left eye: Secondary | ICD-10-CM | POA: Diagnosis not present

## 2019-12-05 DIAGNOSIS — Z961 Presence of intraocular lens: Secondary | ICD-10-CM | POA: Diagnosis not present

## 2019-12-20 ENCOUNTER — Other Ambulatory Visit: Payer: Self-pay | Admitting: Physician Assistant

## 2019-12-20 ENCOUNTER — Other Ambulatory Visit: Payer: Self-pay | Admitting: Cardiology

## 2020-02-05 DIAGNOSIS — K921 Melena: Secondary | ICD-10-CM

## 2020-02-05 DIAGNOSIS — Z1159 Encounter for screening for other viral diseases: Secondary | ICD-10-CM | POA: Diagnosis not present

## 2020-02-05 HISTORY — DX: Encounter for screening for other viral diseases: Z11.59

## 2020-02-05 HISTORY — DX: Melena: K92.1

## 2020-02-29 DIAGNOSIS — K921 Melena: Secondary | ICD-10-CM | POA: Diagnosis not present

## 2020-02-29 DIAGNOSIS — Z01812 Encounter for preprocedural laboratory examination: Secondary | ICD-10-CM | POA: Diagnosis not present

## 2020-03-05 ENCOUNTER — Other Ambulatory Visit: Payer: Self-pay | Admitting: Cardiology

## 2020-03-07 DIAGNOSIS — K219 Gastro-esophageal reflux disease without esophagitis: Secondary | ICD-10-CM | POA: Diagnosis not present

## 2020-03-07 DIAGNOSIS — I10 Essential (primary) hypertension: Secondary | ICD-10-CM | POA: Diagnosis not present

## 2020-03-07 DIAGNOSIS — Z7982 Long term (current) use of aspirin: Secondary | ICD-10-CM | POA: Diagnosis not present

## 2020-03-07 DIAGNOSIS — K5900 Constipation, unspecified: Secondary | ICD-10-CM | POA: Diagnosis not present

## 2020-03-07 DIAGNOSIS — Z9989 Dependence on other enabling machines and devices: Secondary | ICD-10-CM | POA: Diagnosis not present

## 2020-03-07 DIAGNOSIS — Z79899 Other long term (current) drug therapy: Secondary | ICD-10-CM | POA: Diagnosis not present

## 2020-03-07 DIAGNOSIS — K921 Melena: Secondary | ICD-10-CM | POA: Diagnosis not present

## 2020-03-07 DIAGNOSIS — G4733 Obstructive sleep apnea (adult) (pediatric): Secondary | ICD-10-CM | POA: Diagnosis not present

## 2020-03-07 DIAGNOSIS — K5730 Diverticulosis of large intestine without perforation or abscess without bleeding: Secondary | ICD-10-CM | POA: Diagnosis not present

## 2020-03-15 DIAGNOSIS — G4733 Obstructive sleep apnea (adult) (pediatric): Secondary | ICD-10-CM | POA: Diagnosis not present

## 2020-05-06 DIAGNOSIS — Z9989 Dependence on other enabling machines and devices: Secondary | ICD-10-CM | POA: Diagnosis not present

## 2020-05-06 DIAGNOSIS — G4733 Obstructive sleep apnea (adult) (pediatric): Secondary | ICD-10-CM | POA: Diagnosis not present

## 2020-05-10 ENCOUNTER — Ambulatory Visit: Payer: Medicare Other | Admitting: Physician Assistant

## 2020-05-23 DIAGNOSIS — G4733 Obstructive sleep apnea (adult) (pediatric): Secondary | ICD-10-CM | POA: Diagnosis not present

## 2020-06-03 ENCOUNTER — Other Ambulatory Visit: Payer: Self-pay | Admitting: Cardiology

## 2020-06-13 ENCOUNTER — Other Ambulatory Visit: Payer: Self-pay

## 2020-06-13 ENCOUNTER — Ambulatory Visit: Payer: Medicare Other | Admitting: Cardiology

## 2020-06-13 ENCOUNTER — Encounter: Payer: Self-pay | Admitting: Cardiology

## 2020-06-13 VITALS — BP 156/84 | HR 64 | Ht 75.0 in | Wt 220.6 lb

## 2020-06-13 DIAGNOSIS — I10 Essential (primary) hypertension: Secondary | ICD-10-CM | POA: Diagnosis not present

## 2020-06-13 DIAGNOSIS — I420 Dilated cardiomyopathy: Secondary | ICD-10-CM | POA: Diagnosis not present

## 2020-06-13 DIAGNOSIS — I272 Pulmonary hypertension, unspecified: Secondary | ICD-10-CM | POA: Diagnosis not present

## 2020-06-13 DIAGNOSIS — I480 Paroxysmal atrial fibrillation: Secondary | ICD-10-CM

## 2020-06-13 DIAGNOSIS — G4733 Obstructive sleep apnea (adult) (pediatric): Secondary | ICD-10-CM

## 2020-06-13 LAB — BASIC METABOLIC PANEL
BUN/Creatinine Ratio: 18 (ref 10–24)
BUN: 13 mg/dL (ref 8–27)
CO2: 23 mmol/L (ref 20–29)
Calcium: 9.2 mg/dL (ref 8.6–10.2)
Chloride: 103 mmol/L (ref 96–106)
Creatinine, Ser: 0.74 mg/dL — ABNORMAL LOW (ref 0.76–1.27)
GFR calc Af Amer: 103 mL/min/{1.73_m2} (ref >59)
GFR calc non Af Amer: 89 mL/min/{1.73_m2} (ref >59)
Glucose: 123 mg/dL — ABNORMAL HIGH (ref 65–99)
Potassium: 4.2 mmol/L (ref 3.5–5.2)
Sodium: 140 mmol/L (ref 134–144)

## 2020-06-13 MED ORDER — METOPROLOL SUCCINATE ER 50 MG PO TB24: 50.0000 mg | ORAL_TABLET | Freq: Every day | ORAL | 1 refills | Status: DC

## 2020-06-13 NOTE — Progress Notes (Signed)
Cardiology Office Note:    Date:  06/13/2020   ID:  EILEEN CROSWELL, DOB [birth year], MRN [id number]  PCP:  Marge Duncans, PA-C  Cardiologist:  Jenne Campus, MD    Referring MD: Marge Duncans, PA-C   No chief complaint on file. I am doing fine but I am short of breath sometimes  History of Present Illness:    Adam Bennett is a 79 y.o. male with past medical history significant for cardiomyopathy with normalization of left ventricle ejection fraction, mild pulmonary hypertension felt to be related to COPD, paroxysmal atrial fibrillation only one documented episode, essential hypertension sinus bradycardia. Comes today to my office for follow-up. Overall doing well. But complain of having some shortness of breath with exertion. Denies having a chest pain tightness squeezing pressure burning chest. No dizziness no passing out. He also noted his heart rate being bradycardic less than fifty and that make him concerned. There is no swelling of lower extremities no palpitations.  Past Medical History:  Diagnosis Date   A-fib [facility])    Bilateral carpal tunnel syndrome 02/22/2018   Chronic sinus complaints    Dilated cardiomyopathy ([facility])    GERD (gastroesophageal reflux disease)    Hypertension    NSVT (nonsustained ventricular tachycardia) ([facility])    Ulnar neuropathy at elbow 02/22/2018   Bilateral    Past Surgical History:  Procedure Laterality Date   BACK SURGERY     CARDIAC CATHETERIZATION     DONE IN [place], 84YRS AGO   CHOLECYSTECTOMY     FOOT SURGERY     HEMORRHOID SURGERY     HERNIA REPAIR     JOINT REPLACEMENT     BILATER KNEE REPLACEMENTS   TRANSURETHRAL RESECTION OF PROSTATE      Current Medications: Current Meds  Medication Sig   albuterol (PROAIR HFA) 108 (90 Base) MCG/ACT inhaler Inhale 1 puff into the lungs as needed for wheezing or shortness of breath.   allopurinol (ZYLOPRIM) 100 MG tablet TAKE 1 TABLET BY MOUTH EVERY DAY   aspirin EC 81 MG tablet  Take 81 mg by mouth daily.   diltiazem (CARDIZEM CD) 180 MG 24 hr capsule TAKE ONE CAPSULE BY MOUTH EVERY DAY   indomethacin (INDOCIN) 50 MG capsule Take 50 mg by mouth 2 (two) times daily with a meal.    lisinopril (ZESTRIL) 10 MG tablet TAKE 1 TABLET BY MOUTH ONCE DAILY   metoprolol succinate (TOPROL-XL) 50 MG 24 hr tablet TAKE 1& 1/2 TABLETS ONCE DAILY.   Multiple Vitamin (MULTIVITAMIN WITH MINERALS) TABS tablet Take 1 tablet by mouth daily.   omeprazole (PRILOSEC) 20 MG capsule Take 20 mg by mouth 2 (two) times daily before a meal.   polyethylene glycol (MIRALAX / GLYCOLAX) packet Take 17 g by mouth 2 (two) times daily.     Allergies:   Patient has no known allergies.   Social History   Socioeconomic History   Marital status: Married    Spouse name: Not on file   Number of children: Not on file   Years of education: Not on file   Highest education level: Not on file  Occupational History   Not on file  Tobacco Use   Smoking status: Never Smoker   Smokeless tobacco: Never Used  Vaping Use   Vaping Use: Never used  Substance and Sexual Activity   Alcohol use: Yes    Comment: occasional/rare   Drug use: No   Sexual activity: Not on file  Other Topics Concern  Not on file  Social History Narrative   Not on file   Social Determinants of Health   Financial Resource Strain:    Difficulty of Paying Living Expenses: Not on file  Food Insecurity:    Worried About [place] in the Last Year: Not on file   Ran Out of Food in the Last Year: Not on file  Transportation Needs:    Lack of Transportation (Medical): Not on file   Lack of Transportation (Non-Medical): Not on file  Physical Activity:    Days of Exercise per Week: Not on file   Minutes of Exercise per Session: Not on file  Stress:    Feeling of Stress : Not on file  Social Connections:    Frequency of Communication with Friends and Family: Not on file   Frequency of Social  Gatherings with Friends and Family: Not on file   Attends Religious Services: Not on file   Active Member of Clubs or Organizations: Not on file   Attends Archivist Meetings: Not on file   Marital Status: Not on file     Family History: The patient's family history includes CAD in his brother and father; Colon cancer in his mother; Congestive Heart Failure in his brother and father; Diabetes in his unknown relative; Hypertension in his unknown relative. ROS:   Please see the history of present illness.    All 14 point review of systems negative except as described per history of present illness  EKGs/Labs/Other Studies Reviewed:      Recent Labs: No results found for requested labs within last 8760 hours.  Recent Lipid Panel    Component Value Date/Time   CHOL 196 11/04/2018 1633   TRIG 174 (H) 11/04/2018 1633   HDL 46 11/04/2018 1633   CHOLHDL 4.3 11/04/2018 1633   LDLCALC 115 (H) 11/04/2018 1633    Physical Exam:    VS:  BP (!) 156/84    Pulse 64    Ht 6\' 3"  (1.905 m)    Wt 220 lb 9.6 oz (100.1 kg)    SpO2 98%    BMI 27.57 kg/m     Wt Readings from Last 3 Encounters:  06/13/20 220 lb 9.6 oz (100.1 kg)  11/28/19 220 lb (99.8 kg)  06/28/19 218 lb 12.8 oz (99.2 kg)     GEN:  Well nourished, well developed in no acute distress HEENT: Normal NECK: No JVD; No carotid bruits LYMPHATICS: No lymphadenopathy CARDIAC: RRR, no murmurs, no rubs, no gallops RESPIRATORY:  Clear to auscultation without rales, wheezing or rhonchi  ABDOMEN: Soft, non-tender, non-distended MUSCULOSKELETAL:  No edema; No deformity  SKIN: Warm and dry LOWER EXTREMITIES: no swelling NEUROLOGIC:  Alert and oriented x 3 PSYCHIATRIC:  Normal affect   ASSESSMENT:    1. Paroxysmal atrial fibrillation ([facility])   2. Pulmonary hypertension ([place])   3. Essential hypertension, benign   4. Dilated cardiomyopathy ([place])   5. Obstructive sleep apnea    PLAN:    In order of problems listed  above:  1. Paroxysmal atrial fibrillation: Maintaining sinus rhythm. I will ask him to wear Zio patch for a week to see if there is any recurrences of atrial fibrillation. He does not want to be anticoagulated. I will also ask him to have an echocardiogram to check left ventricle ejection fraction as well as left atrial size. He is on Cardizem 180 which I will continue. 2. Pulmonary hypertension: Will do echocardiogram and early reassess pulmonary artery  pressure. This is most likely group 3 of pulmonary hypertension related to obstructive sleep apnea. 3. History of dilated cardiomyopathy we will repeat echocardiogram to recheck left ventricle ejection fraction clinically he seems compensated. 4. Obstructive sleep apnea he recently had upgraded version of his CPAP mask he is very happy and satisfied with the equipment. 5. Essential hypertension still uncontrolled. I will reduce his metoprolol today because of bradycardia, however I check his Chem-7, if Chem-7 is fine will double the dose of lisinopril from 10 to 20 mg and then will recheck Chem-7 again.   Medication Adjustments/Labs and Tests Ordered: Current medicines are reviewed at length with the patient today.  Concerns regarding medicines are outlined above.  No orders of the defined types were placed in this encounter.  Medication changes: No orders of the defined types were placed in this encounter.   Signed, Park Liter, MD, [facility] 06/13/2020 9:51 AM    [place]

## 2020-06-13 NOTE — Patient Instructions (Signed)
Medication Instructions:   Your physician has recommended you make the following change in your medication:   DECREASE: Metoprolol succinate to 50 mg daily    *If you need a refill on your cardiac medications before your next appointment, please call your pharmacy*   Lab Work: Your physician recommends that you return for lab work today: bmp   If you have labs (blood work) drawn today and your tests are completely normal, you will receive your results only by: Marland Kitchen MyChart Message (if you have MyChart) OR . A paper copy in the mail If you have any lab test that is abnormal or we need to change your treatment, we will call you to review the results.   Testing/Procedures: Your physician has requested that you have an echocardiogram. Echocardiography is a painless test that uses sound waves to create images of your heart. It provides your doctor with information about the size and shape of your heart and how well your heart's chambers and valves are working. This procedure takes approximately one hour. There are no restrictions for this procedure.  A zio monitor was ordered today. It will remain on for 7 days. You will then return monitor and event diary in provided box. It takes 1-2 weeks for report to be downloaded and returned to [place]. We will call you with the results. If monitor falls off or has orange flashing light, please call Zio for further instructions.      Follow-Up: At [facility], you and your health needs are our priority.  As part of our continuing mission to provide you with exceptional heart care, we have created designated Provider Care Teams.  These Care Teams include your primary Cardiologist (physician) and Advanced Practice Providers (APPs -  Physician Assistants and Nurse Practitioners) who all work together to provide you with the care you need, when you need it.  We recommend signing up for the patient portal called "MyChart".  Sign up information is provided on  this After Visit Summary.  MyChart is used to connect with patients for Virtual Visits (Telemedicine).  Patients are able to view lab/test results, encounter notes, upcoming appointments, etc.  Non-urgent messages can be sent to your provider as well.   To learn more about what you can do with MyChart, go to [email or web address].    Your next appointment:   5 month(s)  The format for your next appointment:   In Person  Provider:   Jenne Campus, MD   Other Instructions   Echocardiogram An echocardiogram is a procedure that uses painless sound waves (ultrasound) to produce an image of the heart. Images from an echocardiogram can provide important information about:  Signs of coronary artery disease (CAD).  Aneurysm detection. An aneurysm is a weak or damaged part of an artery wall that bulges out from the normal force of blood pumping through the body.  Heart size and shape. Changes in the size or shape of the heart can be associated with certain conditions, including heart failure, aneurysm, and CAD.  Heart muscle function.  Heart valve function.  Signs of a past heart attack.  Fluid buildup around the heart.  Thickening of the heart muscle.  A tumor or infectious growth around the heart valves. Tell a health care provider about:  Any allergies you have.  All medicines you are taking, including vitamins, herbs, eye drops, creams, and over-the-counter medicines.  Any blood disorders you have.  Any surgeries you have had.  Any medical conditions you have.  Whether you are pregnant or may be pregnant. What are the risks? Generally, this is a safe procedure. However, problems may occur, including:  Allergic reaction to dye (contrast) that may be used during the procedure. What happens before the procedure? No specific preparation is needed. You may eat and drink normally. What happens during the procedure?   An IV tube may be inserted into one of your  veins.  You may receive contrast through this tube. A contrast is an injection that improves the quality of the pictures from your heart.  A gel will be applied to your chest.  A wand-like tool (transducer) will be moved over your chest. The gel will help to transmit the sound waves from the transducer.  The sound waves will harmlessly bounce off of your heart to allow the heart images to be captured in real-time motion. The images will be recorded on a computer. The procedure may vary among health care providers and hospitals. What happens after the procedure?  You may return to your normal, everyday life, including diet, activities, and medicines, unless your health care provider tells you not to do that. Summary  An echocardiogram is a procedure that uses painless sound waves (ultrasound) to produce an image of the heart.  Images from an echocardiogram can provide important information about the size and shape of your heart, heart muscle function, heart valve function, and fluid buildup around your heart.  You do not need to do anything to prepare before this procedure. You may eat and drink normally.  After the echocardiogram is completed, you may return to your normal, everyday life, unless your health care provider tells you not to do that. This information is not intended to replace advice given to you by your health care provider. Make sure you discuss any questions you have with your health care provider. Document Revised: 02/02/2019 Document Reviewed: 11/14/2016 Elsevier Patient Education  [place].

## 2020-06-18 DIAGNOSIS — G4733 Obstructive sleep apnea (adult) (pediatric): Secondary | ICD-10-CM | POA: Diagnosis not present

## 2020-06-20 ENCOUNTER — Ambulatory Visit (INDEPENDENT_AMBULATORY_CARE_PROVIDER_SITE_OTHER): Payer: Medicare Other

## 2020-06-20 DIAGNOSIS — I420 Dilated cardiomyopathy: Secondary | ICD-10-CM

## 2020-06-20 DIAGNOSIS — R001 Bradycardia, unspecified: Secondary | ICD-10-CM | POA: Diagnosis not present

## 2020-06-20 DIAGNOSIS — I480 Paroxysmal atrial fibrillation: Secondary | ICD-10-CM

## 2020-06-20 DIAGNOSIS — I10 Essential (primary) hypertension: Secondary | ICD-10-CM

## 2020-06-21 ENCOUNTER — Telehealth: Payer: Self-pay | Admitting: Emergency Medicine

## 2020-06-21 DIAGNOSIS — Z79899 Other long term (current) drug therapy: Secondary | ICD-10-CM

## 2020-06-21 MED ORDER — LISINOPRIL 20 MG PO TABS: 20.0000 mg | ORAL_TABLET | Freq: Every day | ORAL | 1 refills | Status: DC

## 2020-06-21 NOTE — Telephone Encounter (Signed)
-----   Message from Park Liter, MD sent at 06/19/2020  3:28 PM EDT ----- Please double lisinopril and check Chem-7 week later

## 2020-06-23 DIAGNOSIS — G4733 Obstructive sleep apnea (adult) (pediatric): Secondary | ICD-10-CM | POA: Diagnosis not present

## 2020-06-25 ENCOUNTER — Other Ambulatory Visit: Payer: Self-pay | Admitting: Physician Assistant

## 2020-07-04 ENCOUNTER — Other Ambulatory Visit: Payer: Self-pay

## 2020-07-04 ENCOUNTER — Ambulatory Visit (INDEPENDENT_AMBULATORY_CARE_PROVIDER_SITE_OTHER): Payer: Medicare Other

## 2020-07-04 DIAGNOSIS — I420 Dilated cardiomyopathy: Secondary | ICD-10-CM | POA: Diagnosis not present

## 2020-07-04 DIAGNOSIS — R001 Bradycardia, unspecified: Secondary | ICD-10-CM | POA: Diagnosis not present

## 2020-07-04 DIAGNOSIS — I480 Paroxysmal atrial fibrillation: Secondary | ICD-10-CM | POA: Diagnosis not present

## 2020-07-04 DIAGNOSIS — I10 Essential (primary) hypertension: Secondary | ICD-10-CM

## 2020-07-04 DIAGNOSIS — Z79899 Other long term (current) drug therapy: Secondary | ICD-10-CM | POA: Diagnosis not present

## 2020-07-04 LAB — BASIC METABOLIC PANEL
BUN/Creatinine Ratio: 16 (ref 10–24)
BUN: 16 mg/dL (ref 8–27)
CO2: 23 mmol/L (ref 20–29)
Calcium: 9.6 mg/dL (ref 8.6–10.2)
Chloride: 100 mmol/L (ref 96–106)
Creatinine, Ser: 1 mg/dL (ref 0.76–1.27)
GFR calc Af Amer: 84 mL/min/{1.73_m2} (ref >59)
GFR calc non Af Amer: 72 mL/min/{1.73_m2} (ref >59)
Glucose: 136 mg/dL — ABNORMAL HIGH (ref 65–99)
Potassium: 4.4 mmol/L (ref 3.5–5.2)
Sodium: 138 mmol/L (ref 134–144)

## 2020-07-04 LAB — ECHOCARDIOGRAM COMPLETE
Area-P 1/2: 3.6 cm2
Calc EF: 47.6 %
S' Lateral: 4.2 cm
Single Plane A2C EF: 39.5 %
Single Plane A4C EF: 58.7 %

## 2020-07-04 NOTE — Progress Notes (Signed)
Complete echocardiogram has been performed.  Jimmy Donnamaria Shands RDCS, RVT 

## 2020-07-08 ENCOUNTER — Telehealth: Payer: Self-pay | Admitting: Cardiology

## 2020-07-08 NOTE — Telephone Encounter (Signed)
Follow Up: ° ° ° °Returning your call from this morning. °

## 2020-07-08 NOTE — Telephone Encounter (Signed)
Called patient wife per dpr informed her of results.

## 2020-07-24 DIAGNOSIS — G4733 Obstructive sleep apnea (adult) (pediatric): Secondary | ICD-10-CM | POA: Diagnosis not present

## 2020-08-14 DIAGNOSIS — Z9989 Dependence on other enabling machines and devices: Secondary | ICD-10-CM | POA: Diagnosis not present

## 2020-08-14 DIAGNOSIS — G4733 Obstructive sleep apnea (adult) (pediatric): Secondary | ICD-10-CM | POA: Diagnosis not present

## 2020-08-23 DIAGNOSIS — G4733 Obstructive sleep apnea (adult) (pediatric): Secondary | ICD-10-CM | POA: Diagnosis not present

## 2020-08-27 ENCOUNTER — Telehealth: Payer: Self-pay | Admitting: Physician Assistant

## 2020-08-27 NOTE — Progress Notes (Signed)
°  Chronic Care Management   Outreach Note  08/27/2020 Name: Adam Bennett MRN: [id number] DOB: 12/06/1940  Referred by: Marge Duncans, PA-C Reason for referral : Chronic Care Management   An unsuccessful telephone outreach was attempted today. The patient was referred to the pharmacist for assistance with care management and care coordination.   Follow Up Plan:   Hilario Quarry  Upstream Scheduler

## 2020-09-01 ENCOUNTER — Other Ambulatory Visit: Payer: Self-pay | Admitting: Cardiology

## 2020-09-01 ENCOUNTER — Other Ambulatory Visit: Payer: Self-pay | Admitting: Physician Assistant

## 2020-09-16 ENCOUNTER — Telehealth: Payer: Self-pay

## 2020-09-16 NOTE — Telephone Encounter (Signed)
LM on both phones in pts chart. due for awv. last done on 01/30/2019. Asked pt to call back to schedule

## 2020-09-23 DIAGNOSIS — G4733 Obstructive sleep apnea (adult) (pediatric): Secondary | ICD-10-CM | POA: Diagnosis not present

## 2020-10-21 ENCOUNTER — Telehealth (INDEPENDENT_AMBULATORY_CARE_PROVIDER_SITE_OTHER): Payer: Medicare Other | Admitting: Legal Medicine

## 2020-10-21 ENCOUNTER — Encounter: Payer: Self-pay | Admitting: Legal Medicine

## 2020-10-21 DIAGNOSIS — J180 Bronchopneumonia, unspecified organism: Secondary | ICD-10-CM

## 2020-10-21 DIAGNOSIS — Z20822 Contact with and (suspected) exposure to covid-19: Secondary | ICD-10-CM

## 2020-10-21 DIAGNOSIS — Z9189 Other specified personal risk factors, not elsewhere classified: Secondary | ICD-10-CM

## 2020-10-21 HISTORY — DX: Bronchopneumonia, unspecified organism: J18.0

## 2020-10-21 HISTORY — DX: Other specified personal risk factors, not elsewhere classified: Z91.89

## 2020-10-21 LAB — POC COVID19 BINAXNOW: SARS Coronavirus 2 Ag: NEGATIVE

## 2020-10-21 MED ORDER — TRIAMCINOLONE ACETONIDE 40 MG/ML IJ SUSP
80.0000 mg | Freq: Once | INTRAMUSCULAR | Status: AC
  Administered 2020-10-21: 80 mg via INTRAMUSCULAR

## 2020-10-21 MED ORDER — HYDROCOD POLST-CPM POLST ER 10-8 MG/5ML PO SUER: 5.0000 mL | Freq: Two times a day (BID) | ORAL | 0 refills | Status: DC | PRN

## 2020-10-21 MED ORDER — LEVOFLOXACIN 500 MG PO TABS: 500.0000 mg | ORAL_TABLET | Freq: Every day | ORAL | 0 refills | Status: DC

## 2020-10-21 MED ORDER — PREDNISONE 10 MG (21) PO TBPK: ORAL_TABLET | ORAL | 0 refills | Status: DC

## 2020-10-21 NOTE — Progress Notes (Signed)
Virtual Visit via Telephone Note   This visit type was conducted due to national recommendations for restrictions regarding the COVID-19 Pandemic (e.g. social distancing) in an effort to limit this patient's exposure and mitigate transmission in our community.  Due to his co-morbid illnesses, this patient is at least at moderate risk for complications without adequate follow up.  This format is felt to be most appropriate for this patient at this time.  The patient did not have access to video technology/had technical difficulties with video requiring transitioning to audio format only (telephone).  All issues noted in this document were discussed and addressed.  No physical exam could be performed with this format.  Patient verbally consented to a telehealth visit.   Date:  10/21/2020   ID:  Adam Bennett, DOB 1940-12-18, MRN [id number]  Patient Location: Home Provider Location: Office/Clinic  PCP:  Marge Duncans, PA-C   Evaluation Performed:  New Patient Evaluation  Chief Complaint:  Bad cough, sore throat, sneezing, productive  History of Present Illness:    Adam Bennett is a 79 y.o. male with Bad cough, sore throat, sneezing, productive, some chills no fever.    The patient does have symptoms concerning for COVID-19 infection (fever, chills, cough, or new shortness of breath).    Past Medical History:  Diagnosis Date  . A-fib ([place])   . Bilateral carpal tunnel syndrome 02/22/2018  . Chronic sinus complaints   . Dilated cardiomyopathy ([place])   . GERD (gastroesophageal reflux disease)   . Hypertension   . NSVT (nonsustained ventricular tachycardia) ([place])   . Ulnar neuropathy at elbow 02/22/2018   Bilateral    Past Surgical History:  Procedure Laterality Date  . BACK SURGERY    . CARDIAC CATHETERIZATION     DONE IN [place], [place]YRS AGO  . CHOLECYSTECTOMY    . FOOT SURGERY    . HEMORRHOID SURGERY    . HERNIA REPAIR    . JOINT REPLACEMENT     BILATER KNEE REPLACEMENTS  .  TRANSURETHRAL RESECTION OF PROSTATE      Family History  Problem Relation Age of Onset  . Congestive Heart Failure Father   . CAD Father   . Congestive Heart Failure Brother   . CAD Brother   . Hypertension Unknown   . Diabetes Unknown   . Colon cancer Mother     Social History   Socioeconomic History  . Marital status: Married    Spouse name: Not on file  . Number of children: Not on file  . Years of education: Not on file  . Highest education level: Not on file  Occupational History  . Not on file  Tobacco Use  . Smoking status: Never Smoker  . Smokeless tobacco: Never Used  Vaping Use  . Vaping Use: Never used  Substance and Sexual Activity  . Alcohol use: Yes    Comment: occasional/rare  . Drug use: No  . Sexual activity: Not on file  Other Topics Concern  . Not on file  Social History Narrative  . Not on file   Social Determinants of Health   Financial Resource Strain: Not on file  Food Insecurity: Not on file  Transportation Needs: Not on file  Physical Activity: Not on file  Stress: Not on file  Social Connections: Not on file  Intimate Partner Violence: Not on file    Outpatient Medications Prior to Visit  Medication Sig Dispense Refill  . diltiazem (TIAZAC) 180 MG 24 hr capsule  180 mg daily.    Marland Kitchen acetaminophen (TYLENOL) 650 MG CR tablet Take by mouth.    Marland Kitchen albuterol (PROAIR HFA) 108 (90 Base) MCG/ACT inhaler Inhale 1 puff into the lungs as needed for wheezing or shortness of breath.    . allopurinol (ZYLOPRIM) 100 MG tablet TAKE 1 TABLET BY MOUTH EVERY DAY 30 tablet 5  . aspirin EC 81 MG tablet Take 81 mg by mouth daily.    . indomethacin (INDOCIN) 50 MG capsule Take 50 mg by mouth 2 (two) times daily with a meal.     . lisinopril (ZESTRIL) 20 MG tablet Take 1 tablet (20 mg total) by mouth daily. 90 tablet 1  . loratadine (CLARITIN) 10 MG tablet Take by mouth.    . metoprolol succinate (TOPROL-XL) 50 MG 24 hr tablet Take 1 tablet (50 mg total) by  mouth daily. Take with or immediately following a meal. 90 tablet 1  . Multiple Vitamin (MULTIVITAMIN WITH MINERALS) TABS tablet Take 1 tablet by mouth daily.    Marland Kitchen omeprazole (PRILOSEC) 20 MG capsule TAKE 1 CAPSULE BY MOUTH TWICE DAILY 180 capsule 0  . polyethylene glycol (MIRALAX / GLYCOLAX) packet Take 17 g by mouth 2 (two) times daily.    Marland Kitchen diltiazem (CARDIZEM CD) 180 MG 24 hr capsule TAKE ONE CAPSULE BY MOUTH EVERY DAY 90 capsule 1   No facility-administered medications prior to visit.    Allergies:   Patient has no known allergies.   Social History   Tobacco Use  . Smoking status: Never Smoker  . Smokeless tobacco: Never Used  Vaping Use  . Vaping Use: Never used  Substance Use Topics  . Alcohol use: Yes    Comment: occasional/rare  . Drug use: No     Review of Systems  Constitutional: Positive for chills and malaise/fatigue. Negative for fever.  HENT: Positive for congestion and sore throat.   Eyes: Negative for redness.  Respiratory: Positive for cough and sputum production.   Cardiovascular: Negative for chest pain and palpitations.  Genitourinary: Negative for dysuria.  Musculoskeletal: Negative for myalgias.  Neurological: Negative for dizziness and headaches.  Psychiatric/Behavioral: Negative.      Labs/Other Tests and Data Reviewed:    Recent Labs: 07/04/2020: BUN 16; Creatinine, Ser 1.00; Potassium 4.4; Sodium 138   Recent Lipid Panel Lab Results  Component Value Date/Time   CHOL 196 11/04/2018 04:33 PM   TRIG 174 (H) 11/04/2018 04:33 PM   HDL 46 11/04/2018 04:33 PM   CHOLHDL 4.3 11/04/2018 04:33 PM   LDLCALC 115 (H) 11/04/2018 04:33 PM    Wt Readings from Last 3 Encounters:  10/21/20 218 lb (98.9 kg)  06/13/20 220 lb 9.6 oz (100.1 kg)  11/28/19 220 lb (99.8 kg)     Objective:    Vital Signs:  BP (!) 151/91   Pulse 79   Temp 98.6 F (37 C)   Ht 6\' 3"  (1.905 m)   Wt 218 lb (98.9 kg)   BMI 27.25 kg/m    Physical Exam Constitutional:       Appearance: He is toxic-appearing.  HENT:     Right Ear: Tympanic membrane normal.     Left Ear: Tympanic membrane normal.     Mouth/Throat:     Mouth: Mucous membranes are moist.  Eyes:     Extraocular Movements: Extraocular movements intact.     Conjunctiva/sclera: Conjunctivae normal.  Cardiovascular:     Rate and Rhythm: Normal rate and regular rhythm.     Pulses:  Normal pulses.     Heart sounds: Normal heart sounds.  Pulmonary:     Effort: Pulmonary effort is normal.     Breath sounds: Normal breath sounds.    vs reviewed   ASSESSMENT & PLAN:   Diagnoses and all orders for this visit:   At increased risk of exposure to COVID-19 virus -     POC COVID-19 Rapid covid test performed negative  Bronchial pneumonia -     levofloxacin (LEVAQUIN) 500 MG tablet; Take 1 tablet (500 mg total) by mouth daily. -     predniSONE (STERAPRED UNI-PAK 21 TAB) 10 MG (21) TBPK tablet; Take 6ills first day , then 5 pills day 2 and then cut down one pill day until gone -     chlorpheniramine-HYDROcodone (TUSSIONEX PENNKINETIC ER) 10-8 MG/5ML SUER; Take 5 mLs by mouth every 12 (twelve) hours as needed for cough. -     triamcinolone acetonide (KENALOG-40) injection 80 mg  Patient has signs and symptoms of bronchopneumonia.  If worsening, see again      COVID-19 Education: The signs and symptoms of COVID-19 were discussed with the patient and how to seek care for testing (follow up with PCP or arrange E-visit). The importance of social distancing was discussed today.   I spent 20 minutes dedicated to the care of this patient on the date of this encounter to include face-to-face time with the patient, as well as I had to examine patient  Follow Up:  In Person prn  Signed,  Adam Meeker, MD  10/21/2020 2:45 PM    [place]

## 2020-10-23 DIAGNOSIS — G4733 Obstructive sleep apnea (adult) (pediatric): Secondary | ICD-10-CM | POA: Diagnosis not present

## 2020-11-15 DIAGNOSIS — R0989 Other specified symptoms and signs involving the circulatory and respiratory systems: Secondary | ICD-10-CM | POA: Insufficient documentation

## 2020-11-15 DIAGNOSIS — I4891 Unspecified atrial fibrillation: Secondary | ICD-10-CM | POA: Insufficient documentation

## 2020-11-18 ENCOUNTER — Encounter: Payer: Self-pay | Admitting: Cardiology

## 2020-11-18 ENCOUNTER — Other Ambulatory Visit: Payer: Self-pay

## 2020-11-18 ENCOUNTER — Ambulatory Visit: Payer: Medicare Other | Admitting: Cardiology

## 2020-11-18 VITALS — BP 142/70 | HR 82 | Ht 75.0 in | Wt 214.0 lb

## 2020-11-18 DIAGNOSIS — I272 Pulmonary hypertension, unspecified: Secondary | ICD-10-CM | POA: Diagnosis not present

## 2020-11-18 DIAGNOSIS — I480 Paroxysmal atrial fibrillation: Secondary | ICD-10-CM

## 2020-11-18 DIAGNOSIS — I10 Essential (primary) hypertension: Secondary | ICD-10-CM | POA: Diagnosis not present

## 2020-11-18 DIAGNOSIS — R251 Tremor, unspecified: Secondary | ICD-10-CM

## 2020-11-18 DIAGNOSIS — I420 Dilated cardiomyopathy: Secondary | ICD-10-CM | POA: Diagnosis not present

## 2020-11-18 LAB — LIPID PANEL
Chol/HDL Ratio: 4.4 ratio (ref 0.0–5.0)
Cholesterol, Total: 186 mg/dL (ref 100–199)
HDL: 42 mg/dL (ref >39)
LDL Chol Calc (NIH): 101 mg/dL — ABNORMAL HIGH (ref 0–99)
Triglycerides: 250 mg/dL — ABNORMAL HIGH (ref 0–149)
VLDL Cholesterol Cal: 43 mg/dL — ABNORMAL HIGH (ref 5–40)

## 2020-11-18 NOTE — Progress Notes (Signed)
Cardiology Office Note:    Date:  11/18/2020   ID:  Adam Bennett, DOB 1941-03-05, MRN [id number]  PCP:  Marge Duncans, PA-C  Cardiologist:  Jenne Campus, MD    Referring MD: Marge Duncans, PA-C   Chief Complaint  Patient presents with  . Follow-up  M doing well  History of Present Illness:    Adam Bennett is a 80 y.o. male with past medical history significant for cardiomyopathy with normalization, mild pulmonary hypertension felt to be group 3 related COPD, paroxysmal atrial fibrillation only 1 documented episode of atrial fibrillation, refusing anticoagulation, essential hypertension, sinus bradycardia.  Comes today to my office for follow-up.  Overall cardiac wise doing well.  We had a snow last week and he was able to shovel some snow with no difficulties.  She did get short of breath but no chest pain tightness squeezing pressure burning chest.  Complaint and concern today he have is tremor tremor involve especially right upper extremity however also some lip.  His wife noticing this and asking him to be evaluated.  I will refer him to neurology for evaluation of this problem.  He does have family history of tremor.  But no family history of Parkinson.  Past Medical History:  Diagnosis Date  . A-fib ([place])   . At increased risk of exposure to COVID-19 virus 10/21/2020  . Atrial fibrillation ([place]) 06/25/2014  . Bilateral carpal tunnel syndrome 02/22/2018  . Bronchial pneumonia 10/21/2020  . Cervical radiculopathy at C7 02/22/2018   Left  . Chronic sinus complaints   . Dilated cardiomyopathy ([place])   . Essential hypertension, benign 06/25/2014  . GERD (gastroesophageal reflux disease)   . Hematochezia 02/05/2020  . Hypertension   . Hypotension, unspecified 06/25/2014  . NSVT (nonsustained ventricular tachycardia) ([place])   . Obstructive sleep apnea 05/08/2015  . Pulmonary hypertension ([place]) 05/08/2015  . Special screening examination for viral disease 02/05/2020  . Spondylolisthesis of  lumbar region 06/22/2014  . Syncope 06/25/2014  . Ulnar neuropathy at elbow 02/22/2018   Bilateral    Past Surgical History:  Procedure Laterality Date  . BACK SURGERY    . CARDIAC CATHETERIZATION     DONE IN [place], 32YRS AGO  . CHOLECYSTECTOMY    . FOOT SURGERY    . HEMORRHOID SURGERY    . HERNIA REPAIR    . JOINT REPLACEMENT     BILATER KNEE REPLACEMENTS  . TRANSURETHRAL RESECTION OF PROSTATE      Current Medications: Current Meds  Medication Sig  . acetaminophen (TYLENOL) 650 MG CR tablet Take by mouth.  Marland Kitchen albuterol (VENTOLIN HFA) 108 (90 Base) MCG/ACT inhaler Inhale 1 puff into the lungs as needed for wheezing or shortness of breath.  . allopurinol (ZYLOPRIM) 100 MG tablet TAKE 1 TABLET BY MOUTH EVERY DAY  . aspirin EC 81 MG tablet Take 81 mg by mouth daily.  Marland Kitchen diltiazem (TIAZAC) 180 MG 24 hr capsule 180 mg daily.  . indomethacin (INDOCIN) 50 MG capsule Take 50 mg by mouth 2 (two) times daily with a meal.   . levofloxacin (LEVAQUIN) 500 MG tablet Take 1 tablet (500 mg total) by mouth daily.  Marland Kitchen lisinopril (ZESTRIL) 20 MG tablet Take 1 tablet (20 mg total) by mouth daily.  Marland Kitchen loratadine (CLARITIN) 10 MG tablet Take by mouth.  . metoprolol succinate (TOPROL-XL) 50 MG 24 hr tablet Take 1 tablet (50 mg total) by mouth daily. Take with or immediately following a meal.  . Multiple Vitamin (MULTIVITAMIN  WITH MINERALS) TABS tablet Take 1 tablet by mouth daily.  Marland Kitchen omeprazole (PRILOSEC) 20 MG capsule TAKE 1 CAPSULE BY MOUTH TWICE DAILY  . polyethylene glycol (MIRALAX / GLYCOLAX) packet Take 17 g by mouth 2 (two) times daily.     Allergies:   Patient has no known allergies.   Social History   Socioeconomic History  . Marital status: Married    Spouse name: Not on file  . Number of children: Not on file  . Years of education: Not on file  . Highest education level: Not on file  Occupational History  . Not on file  Tobacco Use  . Smoking status: Never Smoker  . Smokeless  tobacco: Never Used  Vaping Use  . Vaping Use: Never used  Substance and Sexual Activity  . Alcohol use: Yes    Comment: occasional/rare  . Drug use: No  . Sexual activity: Not on file  Other Topics Concern  . Not on file  Social History Narrative  . Not on file   Social Determinants of Health   Financial Resource Strain: Not on file  Food Insecurity: Not on file  Transportation Needs: Not on file  Physical Activity: Not on file  Stress: Not on file  Social Connections: Not on file     Family History: The patient's family history includes CAD in his brother and father; Colon cancer in his mother; Congestive Heart Failure in his brother and father; Diabetes in an other family member; Hypertension in an other family member. ROS:   Please see the history of present illness.    All 14 point review of systems negative except as described per history of present illness  EKGs/Labs/Other Studies Reviewed:      Recent Labs: 07/04/2020: BUN 16; Creatinine, Ser 1.00; Potassium 4.4; Sodium 138  Recent Lipid Panel    Component Value Date/Time   CHOL 196 11/04/2018 1633   TRIG 174 (H) 11/04/2018 1633   HDL 46 11/04/2018 1633   CHOLHDL 4.3 11/04/2018 1633   LDLCALC 115 (H) 11/04/2018 1633    Physical Exam:    VS:  BP (!) 142/70 (BP Location: Left Arm, Patient Position: Sitting)   Pulse 82   Ht 6\' 3"  (1.905 m)   Wt 214 lb (97.1 kg)   SpO2 97%   BMI 26.75 kg/m     Wt Readings from Last 3 Encounters:  11/18/20 214 lb (97.1 kg)  10/21/20 218 lb (98.9 kg)  06/13/20 220 lb 9.6 oz (100.1 kg)     GEN:  Well nourished, well developed in no acute distress HEENT: Normal NECK: No JVD; No carotid bruits LYMPHATICS: No lymphadenopathy CARDIAC: RRR, no murmurs, no rubs, no gallops RESPIRATORY:  Clear to auscultation without rales, wheezing or rhonchi  ABDOMEN: Soft, non-tender, non-distended MUSCULOSKELETAL:  No edema; No deformity  SKIN: Warm and dry LOWER EXTREMITIES: no  swelling NEUROLOGIC:  Alert and oriented x 3 PSYCHIATRIC:  Normal affect   ASSESSMENT:    1. Dilated cardiomyopathy ([facility])   2. Paroxysmal atrial fibrillation ([facility])   3. Pulmonary hypertension ([facility])   4. Tremor    PLAN:    In order of problems listed above:  1. Paroxysmal atrial fibrillation only 1 documented episode, recent monitor did not show episodes of atrial fibrillation, he is refusing anticoagulation. 2. History of cardiomyopathy echocardiogram done in September showed ejection fraction 5055%.  We will continue present management. 3. Questionable history of pulmonary hypertension felt to be group 3 related to COPD/lung condition, however,  last echocardiogram from September showed pulmonary pressure of 27.8 mmHg. 4. Tremor he will be referred to pulmonary for evaluation. 5. Dyslipidemia: We will check his fasting lipid profile today.   Medication Adjustments/Labs and Tests Ordered: Current medicines are reviewed at length with the patient today.  Concerns regarding medicines are outlined above.  No orders of the defined types were placed in this encounter.  Medication changes: No orders of the defined types were placed in this encounter.   Signed, Park Liter, MD, [facility] 11/18/2020 9:30 AM    [place]

## 2020-11-18 NOTE — Patient Instructions (Signed)
Medication Instructions:  Your physician recommends that you continue on your current medications as directed. Please refer to the Current Medication list given to you today.  *If you need a refill on your cardiac medications before your next appointment, please call your pharmacy*   Lab Work: Your physician recommends that you return for lab work in:   lipid If you have labs (blood work) drawn today and your tests are completely normal, you will receive your results only by: Marland Kitchen MyChart Message (if you have MyChart) OR . A paper copy in the mail If you have any lab test that is abnormal or we need to change your treatment, we will call you to review the results.   Testing/Procedures: NONE   Follow-Up: At [facility], you and your health needs are our priority.  As part of our continuing mission to provide you with exceptional heart care, we have created designated Provider Care Teams.  These Care Teams include your primary Cardiologist (physician) and Advanced Practice Providers (APPs -  Physician Assistants and Nurse Practitioners) who all work together to provide you with the care you need, when you need it.  We recommend signing up for the patient portal called "MyChart".  Sign up information is provided on this After Visit Summary.  MyChart is used to connect with patients for Virtual Visits (Telemedicine).  Patients are able to view lab/test results, encounter notes, upcoming appointments, etc.  Non-urgent messages can be sent to your provider as well.   To learn more about what you can do with MyChart, go to [email or web address].    Your next appointment:   6 month(s)  The format for your next appointment:   In Person  Provider:   Jenne Campus, MD   Other Instructions

## 2020-11-19 ENCOUNTER — Telehealth: Payer: Self-pay

## 2020-11-19 NOTE — Telephone Encounter (Signed)
-----   Message from Park Liter, MD sent at 11/19/2020  1:35 PM EST ----- Cholesterol mildly elevated please continue present management, also need to watch diet as well as exercises

## 2020-11-19 NOTE — Telephone Encounter (Signed)
Left a message to return my call.

## 2020-11-19 NOTE — Telephone Encounter (Signed)
-----   Message from Robert J Krasowski, MD sent at 11/19/2020  1:35 PM EST ----- Cholesterol mildly elevated please continue present management, also need to watch diet as well as exercises 

## 2020-11-19 NOTE — Telephone Encounter (Signed)
Message sent to Dr. Agustin Cree: Adam Bennett also states, the patient on his last visit was supposed to inform you, he was prescribed a med (unknow name) that the neurologist Dr. Talitha Givens prescribed for tremors and he stopped due to severe side effects(passed out). Now, he has not seen Dr. Talitha Givens since or notified his office of the following. Patient is still experiencing tremors and he mention his last recent visit you recommend seeing a neurologist. She want to clarify should he return to Dr. Marlou Sa or do recommend another neurologist?

## 2020-11-23 DIAGNOSIS — G4733 Obstructive sleep apnea (adult) (pediatric): Secondary | ICD-10-CM | POA: Diagnosis not present

## 2020-11-29 ENCOUNTER — Other Ambulatory Visit: Payer: Self-pay | Admitting: Physician Assistant

## 2020-11-29 DIAGNOSIS — G4733 Obstructive sleep apnea (adult) (pediatric): Secondary | ICD-10-CM | POA: Diagnosis not present

## 2020-11-30 ENCOUNTER — Other Ambulatory Visit: Payer: Self-pay | Admitting: Cardiology

## 2020-12-23 DIAGNOSIS — G4733 Obstructive sleep apnea (adult) (pediatric): Secondary | ICD-10-CM | POA: Diagnosis not present

## 2020-12-25 ENCOUNTER — Other Ambulatory Visit: Payer: Self-pay | Admitting: Cardiology

## 2020-12-25 ENCOUNTER — Other Ambulatory Visit: Payer: Self-pay | Admitting: Physician Assistant

## 2020-12-31 ENCOUNTER — Other Ambulatory Visit: Payer: Self-pay | Admitting: Cardiology

## 2021-01-21 DIAGNOSIS — G4733 Obstructive sleep apnea (adult) (pediatric): Secondary | ICD-10-CM | POA: Diagnosis not present

## 2021-02-03 DIAGNOSIS — K625 Hemorrhage of anus and rectum: Secondary | ICD-10-CM

## 2021-02-03 DIAGNOSIS — K644 Residual hemorrhoidal skin tags: Secondary | ICD-10-CM | POA: Insufficient documentation

## 2021-02-03 HISTORY — DX: Hemorrhage of anus and rectum: K62.5

## 2021-02-13 ENCOUNTER — Other Ambulatory Visit: Payer: Self-pay | Admitting: Family Medicine

## 2021-02-21 DIAGNOSIS — G4733 Obstructive sleep apnea (adult) (pediatric): Secondary | ICD-10-CM | POA: Diagnosis not present

## 2021-03-18 DIAGNOSIS — G4733 Obstructive sleep apnea (adult) (pediatric): Secondary | ICD-10-CM | POA: Diagnosis not present

## 2021-03-23 DIAGNOSIS — G4733 Obstructive sleep apnea (adult) (pediatric): Secondary | ICD-10-CM | POA: Diagnosis not present

## 2021-04-23 DIAGNOSIS — G4733 Obstructive sleep apnea (adult) (pediatric): Secondary | ICD-10-CM | POA: Diagnosis not present

## 2021-05-08 ENCOUNTER — Other Ambulatory Visit: Payer: Self-pay | Admitting: Physician Assistant

## 2021-05-23 DIAGNOSIS — G4733 Obstructive sleep apnea (adult) (pediatric): Secondary | ICD-10-CM | POA: Diagnosis not present

## 2021-06-11 ENCOUNTER — Encounter: Payer: Self-pay | Admitting: Cardiology

## 2021-06-11 ENCOUNTER — Other Ambulatory Visit: Payer: Self-pay

## 2021-06-11 ENCOUNTER — Ambulatory Visit: Payer: Medicare Other | Admitting: Cardiology

## 2021-06-11 VITALS — BP 144/76 | HR 66 | Ht 74.0 in | Wt 218.4 lb

## 2021-06-11 DIAGNOSIS — R251 Tremor, unspecified: Secondary | ICD-10-CM

## 2021-06-11 DIAGNOSIS — I480 Paroxysmal atrial fibrillation: Secondary | ICD-10-CM

## 2021-06-11 DIAGNOSIS — I272 Pulmonary hypertension, unspecified: Secondary | ICD-10-CM | POA: Diagnosis not present

## 2021-06-11 DIAGNOSIS — I420 Dilated cardiomyopathy: Secondary | ICD-10-CM

## 2021-06-11 NOTE — Patient Instructions (Signed)
Medication Instructions:  Your physician recommends that you continue on your current medications as directed. Please refer to the Current Medication list given to you today.  *If you need a refill on your cardiac medications before your next appointment, please call your pharmacy*   Lab Work: None If you have labs (blood work) drawn today and your tests are completely normal, you will receive your results only by: [place] (if you have MyChart) OR A paper copy in the mail If you have any lab test that is abnormal or we need to change your treatment, we will call you to review the results.   Testing/Procedures: Your physician has requested that you have an echocardiogram. Echocardiography is a painless test that uses sound waves to create images of your heart. It provides your doctor with information about the size and shape of your heart and how well your heart's chambers and valves are working. This procedure takes approximately one hour. There are no restrictions for this procedure.    Follow-Up: At [facility], you and your health needs are our priority.  As part of our continuing mission to provide you with exceptional heart care, we have created designated Provider Care Teams.  These Care Teams include your primary Cardiologist (physician) and Advanced Practice Providers (APPs -  Physician Assistants and Nurse Practitioners) who all work together to provide you with the care you need, when you need it.  We recommend signing up for the patient portal called "MyChart".  Sign up information is provided on this After Visit Summary.  MyChart is used to connect with patients for Virtual Visits (Telemedicine).  Patients are able to view lab/test results, encounter notes, upcoming appointments, etc.  Non-urgent messages can be sent to your provider as well.   To learn more about what you can do with MyChart, go to [email or web address].    Your next appointment:   6  month(s)  The format for your next appointment:   In Person  Provider:   Jenne Campus, MD   Other Instructions

## 2021-06-11 NOTE — Progress Notes (Signed)
Cardiology Office Note:    Date:  06/11/2021   ID:  MITCHELL CHURCHWELL, DOB 22-May-1941, MRN [id number]  PCP:  Marge Duncans, PA-C  Cardiologist:  Jenne Campus, MD    Referring MD: Marge Duncans, PA-C   No chief complaint on file. Doing fine  History of Present Illness:    Adam Bennett is a 80 y.o. male  with past medical history significant for cardiomyopathy with normalization, mild pulmonary hypertension felt to be group 3 related COPD, paroxysmal atrial fibrillation only 1 documented episode of atrial fibrillation, refusing anticoagulation, essential hypertension, sinus bradycardia.  He comes today to my office he said majority of time he does well but he got time that he feels very weak tired and exhausted.  He is wife is here with him in the office and she tells me when she feels that way she check his blood pressure blood pressure and heart rate seems to be reasonable at that time.  Still complain about tremor getting worse.  Denies have any chest pain tightness squeezing pressure burning chest  Past Medical History:  Diagnosis Date   A-fib ([place])    Anal bleeding 02/03/2021   At increased risk of exposure to COVID-19 virus 10/21/2020   Atrial fibrillation ([place]) 06/25/2014   Bilateral carpal tunnel syndrome 02/22/2018   Bronchial pneumonia 10/21/2020   Cervical radiculopathy at C7 02/22/2018   Left   Chronic sinus complaints    Dilated cardiomyopathy ([place])    Essential hypertension, benign 06/25/2014   GERD (gastroesophageal reflux disease)    Hematochezia 02/05/2020   Hypertension    Hypotension, unspecified 06/25/2014   NSVT (nonsustained ventricular tachycardia) ([place])    Obstructive sleep apnea 05/08/2015   Pulmonary hypertension ([place]) 05/08/2015   Special screening examination for viral disease 02/05/2020   Spondylolisthesis of lumbar region 06/22/2014   Syncope 06/25/2014   Ulnar neuropathy at elbow 02/22/2018   Bilateral    Past Surgical History:  Procedure Laterality Date   BACK  SURGERY     CARDIAC CATHETERIZATION     DONE IN [place], 54YRS AGO   CHOLECYSTECTOMY     FOOT SURGERY     HEMORRHOID SURGERY     HERNIA REPAIR     JOINT REPLACEMENT     BILATER KNEE REPLACEMENTS   TRANSURETHRAL RESECTION OF PROSTATE      Current Medications: No outpatient medications have been marked as taking for the 06/11/21 encounter (Office Visit) with Park Liter, MD.     Allergies:   Patient has no known allergies.   Social History   Socioeconomic History   Marital status: Married    Spouse name: Not on file   Number of children: Not on file   Years of education: Not on file   Highest education level: Not on file  Occupational History   Not on file  Tobacco Use   Smoking status: Never   Smokeless tobacco: Never  Vaping Use   Vaping Use: Never used  Substance and Sexual Activity   Alcohol use: Yes    Comment: occasional/rare   Drug use: No   Sexual activity: Not on file  Other Topics Concern   Not on file  Social History Narrative   Not on file   Social Determinants of Health   Financial Resource Strain: Not on file  Food Insecurity: Not on file  Transportation Needs: Not on file  Physical Activity: Not on file  Stress: Not on file  Social Connections: Not on file  Family History: The patient's family history includes CAD in his brother and father; Colon cancer in his mother; Congestive Heart Failure in his brother and father; Diabetes in an other family member; Hypertension in an other family member. ROS:   Please see the history of present illness.    All 14 point review of systems negative except as described per history of present illness  EKGs/Labs/Other Studies Reviewed:      Recent Labs: 07/04/2020: BUN 16; Creatinine, Ser 1.00; Potassium 4.4; Sodium 138  Recent Lipid Panel    Component Value Date/Time   CHOL 186 11/18/2020 0946   TRIG 250 (H) 11/18/2020 0946   HDL 42 11/18/2020 0946   CHOLHDL 4.4 11/18/2020 0946   LDLCALC  101 (H) 11/18/2020 0946    Physical Exam:    VS:  BP (!) 144/76 (BP Location: Right Arm, Patient Position: Sitting)   Pulse 66   Ht '6\' 2"'$  (1.88 m)   Wt 218 lb 6.4 oz (99.1 kg)   SpO2 93%   BMI 28.04 kg/m     Wt Readings from Last 3 Encounters:  06/11/21 218 lb 6.4 oz (99.1 kg)  11/18/20 214 lb (97.1 kg)  10/21/20 218 lb (98.9 kg)     GEN:  Well nourished, well developed in no acute distress HEENT: Normal NECK: No JVD; No carotid bruits LYMPHATICS: No lymphadenopathy CARDIAC: RRR, no murmurs, no rubs, no gallops RESPIRATORY:  Clear to auscultation without rales, wheezing or rhonchi  ABDOMEN: Soft, non-tender, non-distended MUSCULOSKELETAL:  No edema; No deformity  SKIN: Warm and dry LOWER EXTREMITIES: no swelling NEUROLOGIC:  Alert and oriented x 3 PSYCHIATRIC:  Normal affect   ASSESSMENT:    1. Dilated cardiomyopathy (HCC)   2. Pulmonary hypertension (HCC)   3. Paroxysmal atrial fibrillation (HCC)   4. Tremor    PLAN:    In order of problems listed above:  Dilated cardiomyopathy last ejection fraction 50 to 55% now he complained about weakness fatigue, will check his echocardiogram again.  I will not alter any of his medications. Pulmonary hypertension denies having any shortness of breath just fatigue.  Again echocardiogram will be done to check pulmonary pressure Paroxysmal atrial fibrillation denies having any Tremor slightly worse he does have appoint with neurologist next month.  Hopefully that will be addressed.   Medication Adjustments/Labs and Tests Ordered: Current medicines are reviewed at length with the patient today.  Concerns regarding medicines are outlined above.  No orders of the defined types were placed in this encounter.  Medication changes: No orders of the defined types were placed in this encounter.   Signed, Park Liter, MD, [facility] 06/11/2021 3:56 PM    [place]

## 2021-06-23 DIAGNOSIS — G4733 Obstructive sleep apnea (adult) (pediatric): Secondary | ICD-10-CM | POA: Diagnosis not present

## 2021-06-24 DIAGNOSIS — G4733 Obstructive sleep apnea (adult) (pediatric): Secondary | ICD-10-CM | POA: Diagnosis not present

## 2021-06-25 ENCOUNTER — Other Ambulatory Visit: Payer: Self-pay

## 2021-06-25 ENCOUNTER — Ambulatory Visit (INDEPENDENT_AMBULATORY_CARE_PROVIDER_SITE_OTHER): Payer: Medicare Other

## 2021-06-25 DIAGNOSIS — I420 Dilated cardiomyopathy: Secondary | ICD-10-CM

## 2021-06-25 LAB — ECHOCARDIOGRAM COMPLETE
Area-P 1/2: 2.61 cm2
S' Lateral: 3.7 cm

## 2021-06-26 ENCOUNTER — Telehealth: Payer: Self-pay | Admitting: Cardiology

## 2021-06-26 NOTE — Telephone Encounter (Signed)
Result had been given to pt.

## 2021-06-26 NOTE — Telephone Encounter (Signed)
  Pt's wife returning call to get echo result  

## 2021-07-01 ENCOUNTER — Other Ambulatory Visit: Payer: Self-pay | Admitting: Physician Assistant

## 2021-07-14 ENCOUNTER — Ambulatory Visit: Payer: Medicare Other | Admitting: Cardiology

## 2021-07-24 DIAGNOSIS — G4733 Obstructive sleep apnea (adult) (pediatric): Secondary | ICD-10-CM | POA: Diagnosis not present

## 2021-08-01 ENCOUNTER — Encounter: Payer: Self-pay | Admitting: Gastroenterology

## 2021-08-02 ENCOUNTER — Other Ambulatory Visit: Payer: Self-pay | Admitting: Physician Assistant

## 2021-08-02 ENCOUNTER — Other Ambulatory Visit: Payer: Self-pay | Admitting: Cardiology

## 2021-08-05 ENCOUNTER — Encounter: Payer: Self-pay | Admitting: Physician Assistant

## 2021-08-05 ENCOUNTER — Ambulatory Visit (INDEPENDENT_AMBULATORY_CARE_PROVIDER_SITE_OTHER): Payer: Medicare Other | Admitting: Physician Assistant

## 2021-08-05 ENCOUNTER — Other Ambulatory Visit: Payer: Self-pay

## 2021-08-05 VITALS — BP 126/80 | HR 64 | Temp 97.5°F | Ht 74.0 in | Wt 214.4 lb

## 2021-08-05 DIAGNOSIS — M1A00X0 Idiopathic chronic gout, unspecified site, without tophus (tophi): Secondary | ICD-10-CM | POA: Diagnosis not present

## 2021-08-05 DIAGNOSIS — I10 Essential (primary) hypertension: Secondary | ICD-10-CM

## 2021-08-05 DIAGNOSIS — J3089 Other allergic rhinitis: Secondary | ICD-10-CM

## 2021-08-05 DIAGNOSIS — Z125 Encounter for screening for malignant neoplasm of prostate: Secondary | ICD-10-CM

## 2021-08-05 DIAGNOSIS — K219 Gastro-esophageal reflux disease without esophagitis: Secondary | ICD-10-CM

## 2021-08-05 MED ORDER — ALLOPURINOL 100 MG PO TABS: 100.0000 mg | ORAL_TABLET | Freq: Every day | ORAL | 1 refills | Status: DC

## 2021-08-05 MED ORDER — INDOMETHACIN 50 MG PO CAPS: 50.0000 mg | ORAL_CAPSULE | Freq: Two times a day (BID) | ORAL | 5 refills | Status: DC

## 2021-08-05 MED ORDER — OMEPRAZOLE 20 MG PO CPDR: 20.0000 mg | DELAYED_RELEASE_CAPSULE | Freq: Two times a day (BID) | ORAL | 1 refills | Status: DC

## 2021-08-05 MED ORDER — TRIAMCINOLONE ACETONIDE 40 MG/ML IJ SUSP: 80.0000 mg | Freq: Once | INTRAMUSCULAR | Status: DC

## 2021-08-05 NOTE — Addendum Note (Signed)
Addended byMarge Duncans on: 08/05/2021 12:07 PM   Modules accepted: Orders

## 2021-08-05 NOTE — Progress Notes (Signed)
Subjective:  Patient ID: Adam Bennett, male    DOB: 1941/09/08  Age: 80 y.o. MRN: [id number]  Chief Complaint  Patient presents with   Hypertension    HPI  Pt presents for follow up of hypertension.  The patient is tolerating the medication well without side effects. Compliance with treatment has been good; including taking medication as directed , maintains a healthy diet and regular exercise regimen , and following up as directed. Currently on diltiazem, zestril and toprol Does follow with cardiology regularly as well (has history of afib)  Pt with history of GERD - states symptoms contolled well on prilosec 20mg  bid  Pt with allergy symptoms for the past 3-4 weeks - sneezing a lot and runny nose - would like kenalog injection   Pt with history of gout - takes allopurinol daily and when has a flare uses indomethacin as needed -- currently no symptoms  Current Outpatient Medications on File Prior to Visit  Medication Sig Dispense Refill   acetaminophen (TYLENOL) 650 MG CR tablet Take 650 mg by mouth as needed for pain.     albuterol (VENTOLIN HFA) 108 (90 Base) MCG/ACT inhaler Inhale 1 puff into the lungs as needed for wheezing or shortness of breath.     aspirin EC 81 MG tablet Take 81 mg by mouth daily.     diltiazem (CARDIZEM CD) 180 MG 24 hr capsule TAKE ONE CAPSULE BY MOUTH EVERY DAY (Patient taking differently: Take 180 mg by mouth daily.) 90 capsule 2   lisinopril (ZESTRIL) 20 MG tablet TAKE 1 TABLET(20 MG) BY MOUTH DAILY 90 tablet 1   loratadine (CLARITIN) 10 MG tablet Take 10 mg by mouth as needed for allergies.     metoprolol succinate (TOPROL-XL) 50 MG 24 hr tablet TAKE 1 TABLET(50 MG) BY MOUTH DAILY WITH OR IMMEDIATELY FOLLOWING A MEAL (Patient taking differently: Take 50 mg by mouth daily.) 90 tablet 2   Multiple Vitamin (MULTIVITAMIN WITH MINERALS) TABS tablet Take 1 tablet by mouth daily. Unknown strength     polyethylene glycol (MIRALAX / GLYCOLAX) packet Take 17 g by  mouth 2 (two) times daily.     No current facility-administered medications on file prior to visit.   Past Medical History:  Diagnosis Date   A-fib [facility])    Anal bleeding 02/03/2021   At increased risk of exposure to COVID-19 virus 10/21/2020   Atrial fibrillation ([place]) 06/25/2014   Bilateral carpal tunnel syndrome 02/22/2018   Bronchial pneumonia 10/21/2020   Cervical radiculopathy at C7 02/22/2018   Left   Chronic sinus complaints    Dilated cardiomyopathy ([place])    Essential hypertension, benign 06/25/2014   GERD (gastroesophageal reflux disease)    Hematochezia 02/05/2020   Hypertension    Hypotension, unspecified 06/25/2014   NSVT (nonsustained ventricular tachycardia)    Obstructive sleep apnea 05/08/2015   Pulmonary hypertension ([place]) 05/08/2015   Special screening examination for viral disease 02/05/2020   Spondylolisthesis of lumbar region 06/22/2014   Syncope 06/25/2014   Ulnar neuropathy at elbow 02/22/2018   Bilateral   Past Surgical History:  Procedure Laterality Date   BACK SURGERY     CARDIAC CATHETERIZATION     DONE IN [place], 22YRS AGO   CHOLECYSTECTOMY     FOOT SURGERY     HEMORRHOID SURGERY     HERNIA REPAIR     JOINT REPLACEMENT     BILATER KNEE REPLACEMENTS   TRANSURETHRAL RESECTION OF PROSTATE      Family History  Problem Relation Age of Onset   Congestive Heart Failure Father    CAD Father    Congestive Heart Failure Brother    CAD Brother    Hypertension Other    Diabetes Other    Colon cancer Mother    Social History   Socioeconomic History   Marital status: Married    Spouse name: Not on file   Number of children: Not on file   Years of education: Not on file   Highest education level: Not on file  Occupational History   Not on file  Tobacco Use   Smoking status: Never   Smokeless tobacco: Never  Vaping Use   Vaping Use: Never used  Substance and Sexual Activity   Alcohol use: Yes    Comment: occasional/rare   Drug use: No    Sexual activity: Not on file  Other Topics Concern   Not on file  Social History Narrative   Not on file   Social Determinants of Health   Financial Resource Strain: Not on file  Food Insecurity: Not on file  Transportation Needs: Not on file  Physical Activity: Not on file  Stress: Not on file  Social Connections: Not on file    Review of Systems  CONSTITUTIONAL: Negative for chills, fatigue, fever, unintentional weight gain and unintentional weight loss.  E/N/T: Negative for ear pain, nasal congestion and sore throat.  CARDIOVASCULAR: Negative for chest pain, dizziness, palpitations and pedal edema.  RESPIRATORY: Negative for recent cough and dyspnea.  GASTROINTESTINAL: Negative for abdominal pain, acid reflux symptoms, constipation, diarrhea, nausea and vomiting.  MSK: Negative for arthralgias and myalgias.  INTEGUMENTARY: Negative for rash.  NEUROLOGICAL: Negative for dizziness and headaches.  PSYCHIATRIC: Negative for sleep disturbance and to question depression screen.  Negative for depression, negative for anhedonia.      Objective:  BP 126/80 (BP Location: Left Arm, Patient Position: Sitting, Cuff Size: Normal)   Pulse 64   Temp (!) 97.5 F (36.4 C) (Temporal)   Ht 6\' 2"  (1.88 m)   Wt 214 lb 6.4 oz (97.3 kg)   SpO2 99%   BMI 27.53 kg/m   BP/Weight 08/05/2021 06/11/2021 9/83/3825  Systolic BP 053 976 734  Diastolic BP 80 76 70  Wt. (Lbs) 214.4 218.4 214  BMI 27.53 28.04 26.75    Physical Exam PHYSICAL EXAM:   VS: BP 126/80 (BP Location: Left Arm, Patient Position: Sitting, Cuff Size: Normal)   Pulse 64   Temp (!) 97.5 F (36.4 C) (Temporal)   Ht 6\' 2"  (1.88 m)   Wt 214 lb 6.4 oz (97.3 kg)   SpO2 99%   BMI 27.53 kg/m   GEN: Well nourished, well developed, in no acute distress  Cardiac: RRR; no murmurs, rubs, or gallops,no edema -  Respiratory:  normal respiratory rate and pattern with no distress - normal breath sounds with no rales, rhonchi, wheezes  or rubs MS: no deformity or atrophy  Skin: warm and dry, no rash  Neuro:  Alert and Oriented x 3,  CN II-Xii grossly intact Psych: euthymic mood, appropriate affect and demeanor  Diabetic Foot Exam - Simple   No data filed      Lab Results  Component Value Date   WBC 7.5 06/27/2014   HGB 11.3 (L) 06/27/2014   HCT 33.2 (L) 06/27/2014   PLT 299 06/27/2014   GLUCOSE 136 (H) 07/04/2020   CHOL 186 11/18/2020   TRIG 250 (H) 11/18/2020   HDL 42 11/18/2020  [place] 101 (H) 11/18/2020   ALT 19 07/02/2010   AST 20 07/02/2010   NA 138 07/04/2020   K 4.4 07/04/2020   CL 100 07/04/2020   CREATININE 1.00 07/04/2020   BUN 16 07/04/2020   CO2 23 07/04/2020   TSH 0.781 06/25/2014   INR 0.93 07/02/2010   HGBA1C 6.1 (H) 06/27/2014      Assessment & Plan:   Problem List Items Addressed This Visit       Cardiovascular and Mediastinum   Essential hypertension, benign - Primary   Relevant Orders   CBC with Differential/Platelet   Comprehensive metabolic panel   Lipid panel   TSH Continue current meds     Digestive   GERD (gastroesophageal reflux disease)   Relevant Medications   omeprazole (PRILOSEC) 20 MG capsule   Other Visit Diagnoses     Idiopathic chronic gout without tophus, unspecified site       Relevant Medications   allopurinol (ZYLOPRIM) 100 MG tablet   indomethacin (INDOCIN) 50 MG capsule   Other Relevant Orders   Uric acid   Seasonal allergic rhinitis due to other allergic trigger       Relevant Medications   triamcinolone acetonide (KENALOG-40) injection 80 mg     .  Meds ordered this encounter  Medications   triamcinolone acetonide (KENALOG-40) injection 80 mg   allopurinol (ZYLOPRIM) 100 MG tablet    Sig: Take 1 tablet (100 mg total) by mouth daily.    Dispense:  90 tablet    Refill:  1    **Patient requests 90 days supply**    Order Specific Question:   Supervising Provider    Answer:   SCORPIO, FORTIN   indomethacin (INDOCIN) 50 MG  capsule    Sig: Take 1 capsule (50 mg total) by mouth 2 (two) times daily with a meal.    Dispense:  60 capsule    Refill:  5    Order Specific Question:   Supervising Provider    Answer:   Shelton Silvas   omeprazole (PRILOSEC) 20 MG capsule    Sig: Take 1 capsule (20 mg total) by mouth 2 (two) times daily.    Dispense:  180 capsule    Refill:  1    Order Specific Question:   Supervising Provider    AnswerDECODA, VAN    Orders Placed This Encounter  Procedures   Uric acid   CBC with Differential/Platelet   Comprehensive metabolic panel   Lipid panel   TSH     Follow-up: Return in about 6 months (around 02/03/2022) for chronic fasting follow up.  An After Visit Summary was printed and given to the patient.  Yetta Flock [facility] [phone]

## 2021-08-06 LAB — COMPREHENSIVE METABOLIC PANEL
ALT: 19 IU/L (ref 0–44)
AST: 20 IU/L (ref 0–40)
Albumin/Globulin Ratio: 2.8 — ABNORMAL HIGH (ref 1.2–2.2)
Albumin: 4.5 g/dL (ref 3.7–4.7)
Alkaline Phosphatase: 97 IU/L (ref 44–121)
BUN/Creatinine Ratio: 13 (ref 10–24)
BUN: 11 mg/dL (ref 8–27)
Bilirubin Total: 0.6 mg/dL (ref 0.0–1.2)
CO2: 23 mmol/L (ref 20–29)
Calcium: 9 mg/dL (ref 8.6–10.2)
Chloride: 100 mmol/L (ref 96–106)
Creatinine, Ser: 0.85 mg/dL (ref 0.76–1.27)
Globulin, Total: 1.6 g/dL (ref 1.5–4.5)
Glucose: 106 mg/dL — ABNORMAL HIGH (ref 70–99)
Potassium: 4.4 mmol/L (ref 3.5–5.2)
Sodium: 137 mmol/L (ref 134–144)
Total Protein: 6.1 g/dL (ref 6.0–8.5)
eGFR: 89 mL/min/{1.73_m2} (ref >59)

## 2021-08-06 LAB — CBC WITH DIFFERENTIAL/PLATELET
Basophils Absolute: 0.1 10*3/uL (ref 0.0–0.2)
Basos: 1 %
EOS (ABSOLUTE): 0.3 10*3/uL (ref 0.0–0.4)
Eos: 5 %
Hematocrit: 47.1 % (ref 37.5–51.0)
Hemoglobin: 15.8 g/dL (ref 13.0–17.7)
Immature Grans (Abs): 0 10*3/uL (ref 0.0–0.1)
Immature Granulocytes: 1 %
Lymphocytes Absolute: 1.2 10*3/uL (ref 0.7–3.1)
Lymphs: 21 %
MCH: 31 pg (ref 26.6–33.0)
MCHC: 33.5 g/dL (ref 31.5–35.7)
MCV: 92 fL (ref 79–97)
Monocytes Absolute: 0.5 10*3/uL (ref 0.1–0.9)
Monocytes: 9 %
Neutrophils Absolute: 3.7 10*3/uL (ref 1.4–7.0)
Neutrophils: 63 %
Platelets: 235 10*3/uL (ref 150–450)
RBC: 5.1 x10E6/uL (ref 4.14–5.80)
RDW: 13.1 % (ref 11.6–15.4)
WBC: 5.9 10*3/uL (ref 3.4–10.8)

## 2021-08-06 LAB — LIPID PANEL
Chol/HDL Ratio: 4.4 ratio (ref 0.0–5.0)
Cholesterol, Total: 185 mg/dL (ref 100–199)
HDL: 42 mg/dL (ref >39)
LDL Chol Calc (NIH): 116 mg/dL — ABNORMAL HIGH (ref 0–99)
Triglycerides: 149 mg/dL (ref 0–149)
VLDL Cholesterol Cal: 27 mg/dL (ref 5–40)

## 2021-08-06 LAB — CARDIOVASCULAR RISK ASSESSMENT

## 2021-08-06 LAB — URIC ACID: Uric Acid: 4.6 mg/dL (ref 3.8–8.4)

## 2021-08-06 LAB — TSH: TSH: 1.02 u[IU]/mL (ref 0.450–4.500)

## 2021-08-18 ENCOUNTER — Encounter: Payer: Self-pay | Admitting: Physician Assistant

## 2021-08-18 ENCOUNTER — Telehealth (INDEPENDENT_AMBULATORY_CARE_PROVIDER_SITE_OTHER): Payer: Medicare Other | Admitting: Physician Assistant

## 2021-08-18 VITALS — BP 141/84 | HR 69 | Temp 98.4°F | Ht 75.0 in | Wt 214.0 lb

## 2021-08-18 DIAGNOSIS — J060 Acute laryngopharyngitis: Secondary | ICD-10-CM | POA: Diagnosis not present

## 2021-08-18 DIAGNOSIS — U071 COVID-19: Secondary | ICD-10-CM

## 2021-08-18 MED ORDER — AZITHROMYCIN 250 MG PO TABS: ORAL_TABLET | ORAL | 0 refills | Status: AC

## 2021-08-18 MED ORDER — MOLNUPIRAVIR EUA 200MG CAPSULE: 4.0000 | ORAL_CAPSULE | Freq: Two times a day (BID) | ORAL | 0 refills | Status: AC

## 2021-08-18 MED ORDER — PROMETHAZINE-DM 6.25-15 MG/5ML PO SYRP: 5.0000 mL | ORAL_SOLUTION | Freq: Four times a day (QID) | ORAL | 0 refills | Status: DC | PRN

## 2021-08-18 NOTE — Progress Notes (Signed)
Virtual Visit via Telephone Note   This visit type was conducted due to national recommendations for restrictions regarding the COVID-19 Pandemic (e.g. social distancing) in an effort to limit this patient's exposure and mitigate transmission in our community.  Due to his co-morbid illnesses, this patient is at least at moderate risk for complications without adequate follow up.  This format is felt to be most appropriate for this patient at this time.  The patient did not have access to video technology/had technical difficulties with video requiring transitioning to audio format only (telephone).  All issues noted in this document were discussed and addressed.  No physical exam could be performed with this format.  Patient verbally consented to a telehealth visit.   Date:  08/18/2021   ID:  GLYNN YEPES, DOB [birth year], MRN [id number]  Patient Location: Home Provider Location: Office  PCP:  Marge Duncans, PA-C   Chief Complaint:  URI/COVID  History of Present Illness:    CHRISTIEN FRANKL is a 80 y.o. male with complaints of 4 days of malaise, sore throat, low grade fever and cough - he took a COVID home test yesterday and was positive He has taken a partial prescription of a zpack from his wife as well Denies dyspnea Actually states he is feeling better today  The patient does have symptoms concerning for COVID-19 infection (fever, chills, cough, or new shortness of breath).    Past Medical History:  Diagnosis Date   A-fib ([place])    Anal bleeding 02/03/2021   At increased risk of exposure to COVID-19 virus 10/21/2020   Atrial fibrillation ([place]) 06/25/2014   Bilateral carpal tunnel syndrome 02/22/2018   Bronchial pneumonia 10/21/2020   Cervical radiculopathy at C7 02/22/2018   Left   Chronic sinus complaints    Dilated cardiomyopathy ([place])    Essential hypertension, benign 06/25/2014   GERD (gastroesophageal reflux disease)    Hematochezia 02/05/2020   Hypertension    Hypotension,  unspecified 06/25/2014   NSVT (nonsustained ventricular tachycardia)    Obstructive sleep apnea 05/08/2015   Pulmonary hypertension ([place]) 05/08/2015   Special screening examination for viral disease 02/05/2020   Spondylolisthesis of lumbar region 06/22/2014   Syncope 06/25/2014   Ulnar neuropathy at elbow 02/22/2018   Bilateral   Past Surgical History:  Procedure Laterality Date   BACK SURGERY     CARDIAC CATHETERIZATION     DONE IN [place], 37YRS AGO   CHOLECYSTECTOMY     FOOT SURGERY     HEMORRHOID SURGERY     HERNIA REPAIR     JOINT REPLACEMENT     BILATER KNEE REPLACEMENTS   TRANSURETHRAL RESECTION OF PROSTATE       Current Meds  Medication Sig   azithromycin (ZITHROMAX) 250 MG tablet Take 2 tablets on day 1, then 1 tablet daily on days 2 through 5   molnupiravir EUA (LAGEVRIO) 200 mg CAPS capsule Take 4 capsules (800 mg total) by mouth 2 (two) times daily for 5 days.   promethazine-dextromethorphan (PROMETHAZINE-DM) 6.25-15 MG/5ML syrup Take 5 mLs by mouth 4 (four) times daily as needed for cough.   Current Facility-Administered Medications for the 08/18/21 encounter (Video Visit) with Marge Duncans, PA-C  Medication   triamcinolone acetonide (KENALOG-40) injection 80 mg     Allergies:   Patient has no known allergies.   Social History   Tobacco Use   Smoking status: Never   Smokeless tobacco: Never  Vaping Use   Vaping Use: Never used  Substance  Use Topics   Alcohol use: Yes    Comment: occasional/rare   Drug use: No     Family Hx: The patient's family history includes CAD in his brother and father; Colon cancer in his mother; Congestive Heart Failure in his brother and father; Diabetes in an other family member; Hypertension in an other family member.  ROS:   Please see the history of present illness.    All other systems reviewed and are negative.  Labs/Other Tests and Data Reviewed:    Recent Labs: 08/05/2021: ALT 19; BUN 11; Creatinine, Ser 0.85;  Hemoglobin 15.8; Platelets 235; Potassium 4.4; Sodium 137; TSH 1.020   Recent Lipid Panel Lab Results  Component Value Date/Time   CHOL 185 08/05/2021 09:29 AM   TRIG 149 08/05/2021 09:29 AM   HDL 42 08/05/2021 09:29 AM   CHOLHDL 4.4 08/05/2021 09:29 AM   LDLCALC 116 (H) 08/05/2021 09:29 AM    Wt Readings from Last 3 Encounters:  08/18/21 214 lb (97.1 kg)  08/05/21 214 lb 6.4 oz (97.3 kg)  06/11/21 218 lb 6.4 oz (99.1 kg)     Objective:    Vital Signs:  BP (!) 141/84   Pulse 69   Temp 98.4 F (36.9 C)   Ht 6\' 3"  (1.905 m)   Wt 214 lb (97.1 kg)   BMI 26.75 kg/m      ASSESSMENT & PLAN:    COVID 19 - rx for molnupiravir to take as directed Uri/cough - rx for cough medication and will send in zpack since he has taken partial rx  COVID-19 Education: The signs and symptoms of COVID-19 were discussed with the patient and how to seek care for testing (follow up with PCP or arrange E-visit). The importance of social distancing was discussed today.  Time:   Today, I have spent 10 minutes with the patient with telehealth technology discussing the above problems.     Medication Adjustments/Labs and Tests Ordered: Current medicines are reviewed at length with the patient today.  Concerns regarding medicines are outlined above.   Tests Ordered: No orders of the defined types were placed in this encounter.   Medication Changes: Meds ordered this encounter  Medications   molnupiravir EUA (LAGEVRIO) 200 mg CAPS capsule    Sig: Take 4 capsules (800 mg total) by mouth 2 (two) times daily for 5 days.    Dispense:  40 capsule    Refill:  0    Order Specific Question:   Supervising Provider    Answer:   TAHJIR, SILVERIA   azithromycin (ZITHROMAX) 250 MG tablet    Sig: Take 2 tablets on day 1, then 1 tablet daily on days 2 through 5    Dispense:  6 tablet    Refill:  0    Order Specific Question:   Supervising Provider    Answer:   Shelton Silvas    promethazine-dextromethorphan (PROMETHAZINE-DM) 6.25-15 MG/5ML syrup    Sig: Take 5 mLs by mouth 4 (four) times daily as needed for cough.    Dispense:  118 mL    Refill:  0    Order Specific Question:   Supervising Provider    AnswerARHUM, PEEPLES     Follow Up:  In Person prn  Signed, Webb Silversmith, PA-C  08/18/2021 11:32 AM    [place]

## 2021-08-28 LAB — PSA: Prostate Specific Ag, Serum: 3.5 ng/mL (ref 0.0–4.0)

## 2021-08-28 LAB — SPECIMEN STATUS REPORT

## 2021-09-08 ENCOUNTER — Ambulatory Visit (INDEPENDENT_AMBULATORY_CARE_PROVIDER_SITE_OTHER): Payer: Medicare Other

## 2021-09-08 DIAGNOSIS — Z23 Encounter for immunization: Secondary | ICD-10-CM | POA: Diagnosis not present

## 2021-09-10 ENCOUNTER — Other Ambulatory Visit: Payer: Self-pay | Admitting: Cardiology

## 2021-09-12 ENCOUNTER — Other Ambulatory Visit: Payer: Self-pay | Admitting: Cardiology

## 2021-09-15 ENCOUNTER — Telehealth: Payer: Self-pay | Admitting: Cardiology

## 2021-09-15 MED ORDER — DILTIAZEM HCL ER COATED BEADS 180 MG PO CP24: ORAL_CAPSULE | ORAL | 3 refills | Status: DC

## 2021-09-15 NOTE — Telephone Encounter (Signed)
Refill sent in per request.  

## 2021-09-15 NOTE — Telephone Encounter (Signed)
*  STAT* If patient is at the pharmacy, call can be transferred to refill team.   1. Which medications need to be refilled? (please list name of each medication and dose if known)  diltiazem (CARDIZEM CD) 180 MG 24 hr capsule  2. Which pharmacy/location (including street and city if local pharmacy) is medication to be sent to? [id number] - [place], [place] - [address] AT [address]  3. Do they need a 30 day or 90 day supply? 90 day refill  Pt is going out of town Wednesday and he needs to script before he leaves

## 2021-09-23 DIAGNOSIS — G4733 Obstructive sleep apnea (adult) (pediatric): Secondary | ICD-10-CM | POA: Diagnosis not present

## 2021-09-30 ENCOUNTER — Other Ambulatory Visit: Payer: Self-pay | Admitting: Cardiology

## 2021-10-23 ENCOUNTER — Encounter: Payer: Self-pay | Admitting: Physician Assistant

## 2021-10-23 ENCOUNTER — Ambulatory Visit (INDEPENDENT_AMBULATORY_CARE_PROVIDER_SITE_OTHER): Payer: Medicare Other | Admitting: Physician Assistant

## 2021-10-23 DIAGNOSIS — J060 Acute laryngopharyngitis: Secondary | ICD-10-CM | POA: Diagnosis not present

## 2021-10-23 DIAGNOSIS — R0789 Other chest pain: Secondary | ICD-10-CM

## 2021-10-23 LAB — POC COVID19 BINAXNOW: SARS Coronavirus 2 Ag: NEGATIVE

## 2021-10-23 LAB — POCT INFLUENZA A/B
Influenza A, POC: NEGATIVE
Influenza B, POC: NEGATIVE

## 2021-10-23 MED ORDER — PREDNISONE 20 MG PO TABS: ORAL_TABLET | ORAL | 0 refills | Status: AC

## 2021-10-23 MED ORDER — AZITHROMYCIN 250 MG PO TABS: ORAL_TABLET | ORAL | 0 refills | Status: AC

## 2021-10-23 MED ORDER — HYDROCODONE BIT-HOMATROP MBR 5-1.5 MG/5ML PO SOLN: 5.0000 mL | Freq: Four times a day (QID) | ORAL | 0 refills | Status: DC | PRN

## 2021-10-23 NOTE — Progress Notes (Signed)
Acute Office Visit  Subjective:    Patient ID: Adam Bennett, male    DOB: 1941/06/28, 80 y.o.   MRN: [id number]  Chief Complaint  Patient presents with   Cough    Congestion/ x1 week    HPI: Patient is in today for complaints of cough and congestion for the past week - states at times the cough is productive - has not had a fever but some mild malaise Says that with his coughing spells he gets soreness/pain on right side of chest -- only occurs with cough He does see cardiology regularly and last appt was last month Has been awhile since he has had a chest xray however  Past Medical History:  Diagnosis Date   A-fib ([place])    Anal bleeding 02/03/2021   At increased risk of exposure to COVID-19 virus 10/21/2020   Atrial fibrillation ([place]) 06/25/2014   Bilateral carpal tunnel syndrome 02/22/2018   Bronchial pneumonia 10/21/2020   Cervical radiculopathy at C7 02/22/2018   Left   Chronic sinus complaints    Dilated cardiomyopathy ([place])    Essential hypertension, benign 06/25/2014   GERD (gastroesophageal reflux disease)    Hematochezia 02/05/2020   Hypertension    Hypotension, unspecified 06/25/2014   NSVT (nonsustained ventricular tachycardia)    Obstructive sleep apnea 05/08/2015   Pulmonary hypertension ([place]) 05/08/2015   Special screening examination for viral disease 02/05/2020   Spondylolisthesis of lumbar region 06/22/2014   Syncope 06/25/2014   Ulnar neuropathy at elbow 02/22/2018   Bilateral    Past Surgical History:  Procedure Laterality Date   BACK SURGERY     CARDIAC CATHETERIZATION     DONE IN [place], 49YRS AGO   CHOLECYSTECTOMY     FOOT SURGERY     HEMORRHOID SURGERY     HERNIA REPAIR     JOINT REPLACEMENT     BILATER KNEE REPLACEMENTS   TRANSURETHRAL RESECTION OF PROSTATE      Family History  Problem Relation Age of Onset   Congestive Heart Failure Father    CAD Father    Congestive Heart Failure Brother    CAD Brother    Hypertension Other     Diabetes Other    Colon cancer Mother     Social History   Socioeconomic History   Marital status: Married    Spouse name: Not on file   Number of children: Not on file   Years of education: Not on file   Highest education level: Not on file  Occupational History   Not on file  Tobacco Use   Smoking status: Never   Smokeless tobacco: Never  Vaping Use   Vaping Use: Never used  Substance and Sexual Activity   Alcohol use: Yes    Comment: occasional/rare   Drug use: No   Sexual activity: Not on file  Other Topics Concern   Not on file  Social History Narrative   Not on file   Social Determinants of Health   Financial Resource Strain: Not on file  Food Insecurity: Not on file  Transportation Needs: Not on file  Physical Activity: Not on file  Stress: Not on file  Social Connections: Not on file  Intimate Partner Violence: Not on file    Outpatient Medications Prior to Visit  Medication Sig Dispense Refill   acetaminophen (TYLENOL) 650 MG CR tablet Take 650 mg by mouth as needed for pain.     albuterol (VENTOLIN HFA) 108 (90 Base) MCG/ACT inhaler Inhale  1 puff into the lungs as needed for wheezing or shortness of breath.     allopurinol (ZYLOPRIM) 100 MG tablet Take 1 tablet (100 mg total) by mouth daily. 90 tablet 1   aspirin EC 81 MG tablet Take 81 mg by mouth daily.     diltiazem (CARDIZEM CD) 180 MG 24 hr capsule TAKE 1 CAPSULE BY MOUTH EVERY DAY 90 capsule 3   indomethacin (INDOCIN) 50 MG capsule Take 1 capsule (50 mg total) by mouth 2 (two) times daily with a meal. 60 capsule 5   lisinopril (ZESTRIL) 20 MG tablet TAKE 1 TABLET(20 MG) BY MOUTH DAILY 90 tablet 1   loratadine (CLARITIN) 10 MG tablet Take 10 mg by mouth as needed for allergies.     metoprolol succinate (TOPROL-XL) 50 MG 24 hr tablet TAKE 1 TABLET(50 MG) BY MOUTH DAILY WITH OR IMMEDIATELY FOLLOWING A MEAL 90 tablet 2   Multiple Vitamin (MULTIVITAMIN WITH MINERALS) TABS tablet Take 1 tablet by mouth  daily. Unknown strength     omeprazole (PRILOSEC) 20 MG capsule Take 1 capsule (20 mg total) by mouth 2 (two) times daily. 180 capsule 1   polyethylene glycol (MIRALAX / GLYCOLAX) packet Take 17 g by mouth 2 (two) times daily.     promethazine-dextromethorphan (PROMETHAZINE-DM) 6.25-15 MG/5ML syrup Take 5 mLs by mouth 4 (four) times daily as needed for cough. 118 mL 0   Facility-Administered Medications Prior to Visit  Medication Dose Route Frequency Provider Last Rate Last Admin   triamcinolone acetonide (KENALOG-40) injection 80 mg  80 mg Intramuscular Once Marge Duncans, PA-C        No Known Allergies  Review of Systems CONSTITUTIONAL: Negative for chills, fatigue, fever, unintentional weight gain and unintentional weight loss.  E/N/T:see HPI CARDIOVASCULAR: Negative for chest pain, dizziness, palpitations and pedal edema.  RESPIRATORY: see HPI GASTROINTESTINAL: Negative for abdominal pain, acid reflux symptoms, constipation, diarrhea, nausea and vomiting.          Objective:    Physical Exam PHYSICAL EXAM:   VS: BP 139/78 (BP Location: Right Arm, Patient Position: Sitting)    Pulse 68    Temp 98.3 F (36.8 C) (Temporal)    Ht '6\' 2"'  (1.88 m)    Wt 214 lb (97.1 kg)    SpO2 97%    BMI 27.48 kg/m   GEN: Well nourished, well developed, in no acute distress  HEENT: normal external ears and nose - normal external auditory canals and TMS - - Lips, Teeth and Gums - normal  Oropharynx - erythema/pnd Neck: no JVD or masses - no thyromegaly Cardiac: RRR; no murmurs, rubs, or gallops,no edema - Respiratory:  faint exp rhochi noted Psych: euthymic mood, appropriate affect and demeanor EKG - no acute changes noted  Office Visit on 10/23/2021  Component Date Value Ref Range Status   Influenza A, POC 10/23/2021 Negative  Negative Final   Influenza B, POC 10/23/2021 Negative  Negative Final   SARS Coronavirus 2 Ag 10/23/2021 Negative  Negative Final    BP 139/78 (BP Location: Right  Arm, Patient Position: Sitting)    Pulse 68    Temp 98.3 F (36.8 C) (Temporal)    Ht '6\' 2"'  (1.88 m)    Wt 214 lb (97.1 kg)    SpO2 97%    BMI 27.48 kg/m  Wt Readings from Last 3 Encounters:  10/23/21 214 lb (97.1 kg)  08/18/21 214 lb (97.1 kg)  08/05/21 214 lb 6.4 oz (97.3 kg)    Health  Maintenance Due  Topic Date Due   Pneumonia Vaccine 81+ Years old (1 - PCV) Never done   Hepatitis C Screening  Never done   TETANUS/TDAP  Never done   Zoster Vaccines- Shingrix (1 of 2) Never done   COVID-19 Vaccine (4 - Booster for Pfizer series) 09/24/2020    There are no preventive care reminders to display for this patient.   Lab Results  Component Value Date   TSH 1.020 08/05/2021   Lab Results  Component Value Date   WBC 5.9 08/05/2021   HGB 15.8 08/05/2021   HCT 47.1 08/05/2021   MCV 92 08/05/2021   PLT 235 08/05/2021   Lab Results  Component Value Date   NA 137 08/05/2021   K 4.4 08/05/2021   CO2 23 08/05/2021   GLUCOSE 106 (H) 08/05/2021   BUN 11 08/05/2021   CREATININE 0.85 08/05/2021   BILITOT 0.6 08/05/2021   ALKPHOS 97 08/05/2021   AST 20 08/05/2021   ALT 19 08/05/2021   PROT 6.1 08/05/2021   ALBUMIN 4.5 08/05/2021   CALCIUM 9.0 08/05/2021   ANIONGAP 13 06/27/2014   EGFR 89 08/05/2021   Lab Results  Component Value Date   CHOL 185 08/05/2021   Lab Results  Component Value Date   HDL 42 08/05/2021   Lab Results  Component Value Date   LDLCALC 116 (H) 08/05/2021   Lab Results  Component Value Date   TRIG 149 08/05/2021   Lab Results  Component Value Date   CHOLHDL 4.4 08/05/2021   Lab Results  Component Value Date   HGBA1C 6.1 (H) 06/27/2014       Assessment & Plan:   Problem List Items Addressed This Visit   None Visit Diagnoses     Acute laryngopharyngitis    -  Primary   Relevant Orders   Influenza A/B (Completed)   POC COVID-19 (Completed)   Chest wall pain       Relevant Orders   EKG 12-Lead   DG Chest 2 View      No orders  of the defined types were placed in this encounter.   Orders Placed This Encounter  Procedures   DG Chest 2 View   Influenza A/B   POC COVID-19   EKG 12-Lead     Follow-up: Return if symptoms worsen or fail to improve.  An After Visit Summary was printed and given to the patient.  Yetta Flock [facility] [phone]

## 2021-10-29 DIAGNOSIS — R079 Chest pain, unspecified: Secondary | ICD-10-CM | POA: Diagnosis not present

## 2021-10-29 DIAGNOSIS — R0600 Dyspnea, unspecified: Secondary | ICD-10-CM | POA: Diagnosis not present

## 2021-10-29 DIAGNOSIS — R0602 Shortness of breath: Secondary | ICD-10-CM | POA: Diagnosis not present

## 2021-10-30 ENCOUNTER — Other Ambulatory Visit: Payer: Self-pay | Admitting: Physician Assistant

## 2021-10-30 MED ORDER — ROSUVASTATIN CALCIUM 5 MG PO TABS: 5.0000 mg | ORAL_TABLET | Freq: Every day | ORAL | 1 refills | Status: DC

## 2021-10-31 ENCOUNTER — Other Ambulatory Visit: Payer: Self-pay

## 2021-10-31 DIAGNOSIS — R0789 Other chest pain: Secondary | ICD-10-CM

## 2021-11-03 DIAGNOSIS — R9389 Abnormal findings on diagnostic imaging of other specified body structures: Secondary | ICD-10-CM | POA: Diagnosis not present

## 2021-11-03 DIAGNOSIS — R918 Other nonspecific abnormal finding of lung field: Secondary | ICD-10-CM | POA: Diagnosis not present

## 2021-11-03 DIAGNOSIS — M2578 Osteophyte, vertebrae: Secondary | ICD-10-CM | POA: Diagnosis not present

## 2021-11-04 ENCOUNTER — Telehealth: Payer: Self-pay | Admitting: Cardiology

## 2021-11-04 NOTE — Telephone Encounter (Signed)
Pt c/o medication issue:  1. Name of Medication: rosuvastatin (CRESTOR) 5 MG tablet  2. How are you currently taking this medication (dosage and times per day)? HAVE NOT STARTED TAKING THIS MEDICINE YET  3. Are you having a reaction (difficulty breathing--STAT)?   4. What is your medication issue? PT WAS PRESCRIBED THIS MEDICINE YESTERDAY DURING HIS PCP VISIT. WIFE WANTS TO KNOW IF THE PT CAN COME IN EARLIER THAN HIS ALREADY SCHEDULED APPT IN 11/2021, SHE WANTS TO KNOW IF DR. Raliegh Ip APPROVES OF THE PT TAKING THIS MEDICINE

## 2021-11-05 ENCOUNTER — Encounter: Payer: Self-pay | Admitting: Physician Assistant

## 2021-11-05 NOTE — Telephone Encounter (Signed)
Spoke with pt's wife and gave recommendations as per Dr. Wendy Poet note. Pt's wife verbalized dc instructions and had no additional questions.

## 2021-12-19 ENCOUNTER — Other Ambulatory Visit: Payer: Self-pay

## 2021-12-19 ENCOUNTER — Encounter: Payer: Self-pay | Admitting: Cardiology

## 2021-12-19 ENCOUNTER — Ambulatory Visit: Payer: Medicare Other | Admitting: Cardiology

## 2021-12-19 VITALS — BP 156/80 | HR 89 | Ht 75.0 in | Wt 217.7 lb

## 2021-12-19 DIAGNOSIS — I420 Dilated cardiomyopathy: Secondary | ICD-10-CM | POA: Diagnosis not present

## 2021-12-19 DIAGNOSIS — I10 Essential (primary) hypertension: Secondary | ICD-10-CM

## 2021-12-19 DIAGNOSIS — I480 Paroxysmal atrial fibrillation: Secondary | ICD-10-CM

## 2021-12-19 MED ORDER — ROSUVASTATIN CALCIUM 10 MG PO TABS: 10.0000 mg | ORAL_TABLET | Freq: Every day | ORAL | 3 refills | Status: DC

## 2021-12-19 NOTE — Patient Instructions (Signed)
Medication Instructions:  Your physician has recommended you make the following change in your medication: INCREASE CRESTOR TO 10 MG ONCE DAILY  *If you need a refill on your cardiac medications before your next appointment, please call your pharmacy*   Lab Work: Your physician recommends that you return for lab work in: [place]. LAB OPENS AT 8 AM, NO APPT NEEDED  If you have labs (blood work) drawn today and your tests are completely normal, you will receive your results only by: MyChart Message (if you have MyChart) OR A paper copy in the mail If you have any lab test that is abnormal or we need to change your treatment, we will call you to review the results.   Testing/Procedures: NONE   Follow-Up: At [facility], you and your health needs are our priority.  As part of our continuing mission to provide you with exceptional heart care, we have created designated Provider Care Teams.  These Care Teams include your primary Cardiologist (physician) and Advanced Practice Providers (APPs -  Physician Assistants and Nurse Practitioners) who all work together to provide you with the care you need, when you need it.  We recommend signing up for the patient portal called "MyChart".  Sign up information is provided on this After Visit Summary.  MyChart is used to connect with patients for Virtual Visits (Telemedicine).  Patients are able to view lab/test results, encounter notes, upcoming appointments, etc.  Non-urgent messages can be sent to your provider as well.   To learn more about what you can do with MyChart, go to [email or web address].    Your next appointment:   6 month(s)  The format for your next appointment:   In Person  Provider:   Jenne Campus, MD    Other Instructions

## 2021-12-19 NOTE — Progress Notes (Signed)
Cardiology Office Note:    Date:  12/19/2021   ID:  Adam Bennett, DOB [birth year], MRN [id number]  PCP:  Marge Duncans, PA-C  Cardiologist:  Jenne Campus, MD    Referring MD: Marge Duncans, PA-C   Chief Complaint  Patient presents with   Follow-up  I am doing fine  History of Present Illness:    Adam Bennett is a 81 y.o. male with past medical history significant for cardiomyopathy with normalization, last assessment of his ejection fraction was normal last year, he does have mild degree of pulmonary hypertension felt to be related to his COPD, stage group 3, paroxysmal atrial fibrillation however only 1 documented episode he is refusing anticoagulation, essential hypertension. Comes today to my office overall doing very well.  Denies any chest pain tightness squeezing pressure burning chest fatigue tiredness is still there.  Past Medical History:  Diagnosis Date   A-fib [place])    Anal bleeding 02/03/2021   At increased risk of exposure to COVID-19 virus 10/21/2020   Atrial fibrillation ([place]) 06/25/2014   Bilateral carpal tunnel syndrome 02/22/2018   Bronchial pneumonia 10/21/2020   Cervical radiculopathy at C7 02/22/2018   Left   Chronic sinus complaints    Dilated cardiomyopathy ([place])    Essential hypertension, benign 06/25/2014   GERD (gastroesophageal reflux disease)    Hematochezia 02/05/2020   Hypertension    Hypotension, unspecified 06/25/2014   NSVT (nonsustained ventricular tachycardia)    Obstructive sleep apnea 05/08/2015   Pulmonary hypertension ([place]) 05/08/2015   Special screening examination for viral disease 02/05/2020   Spondylolisthesis of lumbar region 06/22/2014   Syncope 06/25/2014   Ulnar neuropathy at elbow 02/22/2018   Bilateral    Past Surgical History:  Procedure Laterality Date   BACK SURGERY     CARDIAC CATHETERIZATION     DONE IN [place], 20YRS AGO   CHOLECYSTECTOMY     FOOT SURGERY     HEMORRHOID SURGERY     HERNIA REPAIR     JOINT REPLACEMENT      BILATER KNEE REPLACEMENTS   TRANSURETHRAL RESECTION OF PROSTATE      Current Medications: Current Meds  Medication Sig   acetaminophen (TYLENOL) 650 MG CR tablet Take 650 mg by mouth as needed for pain.   allopurinol (ZYLOPRIM) 100 MG tablet Take 1 tablet (100 mg total) by mouth daily.   aspirin EC 81 MG tablet Take 81 mg by mouth daily.   diltiazem (CARDIZEM CD) 180 MG 24 hr capsule TAKE 1 CAPSULE BY MOUTH EVERY DAY (Patient taking differently: Take 180 mg by mouth daily. TAKE 1 CAPSULE BY MOUTH EVERY DAY)   indomethacin (INDOCIN) 50 MG capsule Take 1 capsule (50 mg total) by mouth 2 (two) times daily with a meal.   lisinopril (ZESTRIL) 20 MG tablet TAKE 1 TABLET(20 MG) BY MOUTH DAILY (Patient taking differently: Take 20 mg by mouth daily.)   loratadine (CLARITIN) 10 MG tablet Take 10 mg by mouth as needed for allergies.   metoprolol succinate (TOPROL-XL) 50 MG 24 hr tablet TAKE 1 TABLET(50 MG) BY MOUTH DAILY WITH OR IMMEDIATELY FOLLOWING A MEAL (Patient taking differently: Take 50 mg by mouth daily.)   Multiple Vitamin (MULTIVITAMIN WITH MINERALS) TABS tablet Take 1 tablet by mouth daily. Unknown strength   omeprazole (PRILOSEC) 20 MG capsule Take 1 capsule (20 mg total) by mouth 2 (two) times daily.   polyethylene glycol (MIRALAX / GLYCOLAX) packet Take 17 g by mouth 2 (two) times daily.  rosuvastatin (CRESTOR) 5 MG tablet Take 1 tablet (5 mg total) by mouth daily.   Sennosides-Docusate Sodium (STOOL SOFTENER/LAXATIVE PO) Take 1 tablet by mouth daily. Unknown strenght     Allergies:   Patient has no known allergies.   Social History   Socioeconomic History   Marital status: Married    Spouse name: Not on file   Number of children: Not on file   Years of education: Not on file   Highest education level: Not on file  Occupational History   Not on file  Tobacco Use   Smoking status: Never   Smokeless tobacco: Never  Vaping Use   Vaping Use: Never used  Substance and Sexual  Activity   Alcohol use: Yes    Comment: occasional/rare   Drug use: No   Sexual activity: Not on file  Other Topics Concern   Not on file  Social History Narrative   Not on file   Social Determinants of Health   Financial Resource Strain: Not on file  Food Insecurity: Not on file  Transportation Needs: Not on file  Physical Activity: Not on file  Stress: Not on file  Social Connections: Not on file     Family History: The patient's family history includes CAD in his brother and father; Colon cancer in his mother; Congestive Heart Failure in his brother and father; Diabetes in an other family member; Hypertension in an other family member. ROS:   Please see the history of present illness.    All 14 point review of systems negative except as described per history of present illness  EKGs/Labs/Other Studies Reviewed:      Recent Labs: 08/05/2021: ALT 19; BUN 11; Creatinine, Ser 0.85; Hemoglobin 15.8; Platelets 235; Potassium 4.4; Sodium 137; TSH 1.020  Recent Lipid Panel    Component Value Date/Time   CHOL 185 08/05/2021 0929   TRIG 149 08/05/2021 0929   HDL 42 08/05/2021 0929   CHOLHDL 4.4 08/05/2021 0929   LDLCALC 116 (H) 08/05/2021 0929    Physical Exam:    VS:  BP (!) 156/80 (BP Location: Left Arm, Patient Position: Sitting)    Pulse 89    Ht 6\' 3"  (1.905 m)    Wt 217 lb 11.2 oz (98.7 kg)    SpO2 97%    BMI 27.21 kg/m     Wt Readings from Last 3 Encounters:  12/19/21 217 lb 11.2 oz (98.7 kg)  10/23/21 214 lb (97.1 kg)  08/18/21 214 lb (97.1 kg)     GEN:  Well nourished, well developed in no acute distress HEENT: Normal NECK: No JVD; No carotid bruits LYMPHATICS: No lymphadenopathy CARDIAC: RRR, no murmurs, no rubs, no gallops RESPIRATORY:  Clear to auscultation without rales, wheezing or rhonchi  ABDOMEN: Soft, non-tender, non-distended MUSCULOSKELETAL:  No edema; No deformity  SKIN: Warm and dry LOWER EXTREMITIES: no swelling NEUROLOGIC:  Alert and  oriented x 3 PSYCHIATRIC:  Normal affect   ASSESSMENT:    1. Dilated cardiomyopathy ([place])   2. Primary hypertension   3. Paroxysmal atrial fibrillation ([facility])    PLAN:    In order of problems listed above:  Dilated cardiomyopathy normalization continue present medications we will repeat echocardiogram next year Essential hypertension: Blood pressure well controlled continue present medications. Dyslipidemia I did review his Adam Bennett which show LDL 116 HDL 42.  I advised to increase dose of Crestor to 10 mg daily, will follow-up with fasting lipid profile   Medication Adjustments/Labs and Tests Ordered: Current  medicines are reviewed at length with the patient today.  Concerns regarding medicines are outlined above.  No orders of the defined types were placed in this encounter.  Medication changes: No orders of the defined types were placed in this encounter.   Signed, Park Liter, MD, [facility] 12/19/2021 10:58 AM    [place]

## 2021-12-19 NOTE — Addendum Note (Signed)
Addended by: Jerl Santos R on: 12/19/2021 11:06 AM   Modules accepted: Orders

## 2021-12-22 ENCOUNTER — Encounter: Payer: Self-pay | Admitting: Physician Assistant

## 2021-12-22 ENCOUNTER — Other Ambulatory Visit: Payer: Self-pay

## 2021-12-22 ENCOUNTER — Ambulatory Visit (INDEPENDENT_AMBULATORY_CARE_PROVIDER_SITE_OTHER): Payer: Medicare Other | Admitting: Physician Assistant

## 2021-12-22 VITALS — BP 138/84 | HR 70 | Temp 97.5°F | Ht 75.0 in | Wt 217.0 lb

## 2021-12-22 DIAGNOSIS — J060 Acute laryngopharyngitis: Secondary | ICD-10-CM

## 2021-12-22 DIAGNOSIS — M25552 Pain in left hip: Secondary | ICD-10-CM

## 2021-12-22 DIAGNOSIS — R296 Repeated falls: Secondary | ICD-10-CM | POA: Diagnosis not present

## 2021-12-22 MED ORDER — MELOXICAM 7.5 MG PO TABS: 7.5000 mg | ORAL_TABLET | Freq: Every day | ORAL | 0 refills | Status: DC

## 2021-12-22 MED ORDER — AZITHROMYCIN 250 MG PO TABS: ORAL_TABLET | ORAL | 0 refills | Status: AC

## 2021-12-22 NOTE — Progress Notes (Signed)
Acute Office Visit  Subjective:    Patient ID: Adam Bennett, male    DOB: [birth year], 81 y.o.   MRN: [id number]  Chief Complaint  Patient presents with   Hip Pain    Left    HPI: Patient is in today for complaints of left hip pain.  He states that it has bothered him intermittently for years but recently has fallen and feeling like left hip 'gives way'  About 3 weeks ago he stepped out of his truck onto a curb and then fell to ground after putting weight on left hip.  He currently  is not taking any type of NSAID regularly and agreeable to try  Pt complains of recent sore throat, sinus drainage and sinus pressure - has been taking claritin with minimal relief  Past Medical History:  Diagnosis Date   A-fib ([place])    Anal bleeding 02/03/2021   At increased risk of exposure to COVID-19 virus 10/21/2020   Atrial fibrillation ([place]) 06/25/2014   Bilateral carpal tunnel syndrome 02/22/2018   Bronchial pneumonia 10/21/2020   Cervical radiculopathy at C7 02/22/2018   Left   Chronic sinus complaints    Dilated cardiomyopathy ([place])    Essential hypertension, benign 06/25/2014   GERD (gastroesophageal reflux disease)    Hematochezia 02/05/2020   Hypertension    Hypotension, unspecified 06/25/2014   NSVT (nonsustained ventricular tachycardia)    Obstructive sleep apnea 05/08/2015   Pulmonary hypertension ([place]) 05/08/2015   Special screening examination for viral disease 02/05/2020   Spondylolisthesis of lumbar region 06/22/2014   Syncope 06/25/2014   Ulnar neuropathy at elbow 02/22/2018   Bilateral    Past Surgical History:  Procedure Laterality Date   BACK SURGERY     CARDIAC CATHETERIZATION     DONE IN [place], 11YRS AGO   CHOLECYSTECTOMY     FOOT SURGERY     HEMORRHOID SURGERY     HERNIA REPAIR     JOINT REPLACEMENT     BILATER KNEE REPLACEMENTS   TRANSURETHRAL RESECTION OF PROSTATE      Family History  Problem Relation Age of Onset   Congestive Heart Failure Father    CAD  Father    Congestive Heart Failure Brother    CAD Brother    Hypertension Other    Diabetes Other    Colon cancer Mother     Social History   Socioeconomic History   Marital status: Married    Spouse name: Not on file   Number of children: Not on file   Years of education: Not on file   Highest education level: Not on file  Occupational History   Not on file  Tobacco Use   Smoking status: Never   Smokeless tobacco: Never  Vaping Use   Vaping Use: Never used  Substance and Sexual Activity   Alcohol use: Yes    Comment: occasional/rare   Drug use: No   Sexual activity: Not on file  Other Topics Concern   Not on file  Social History Narrative   Not on file   Social Determinants of Health   Financial Resource Strain: Not on file  Food Insecurity: Not on file  Transportation Needs: Not on file  Physical Activity: Not on file  Stress: Not on file  Social Connections: Not on file  Intimate Partner Violence: Not on file    Outpatient Medications Prior to Visit  Medication Sig Dispense Refill   acetaminophen (TYLENOL) 650 MG CR tablet Take 650 mg  by mouth as needed for pain.     allopurinol (ZYLOPRIM) 100 MG tablet Take 1 tablet (100 mg total) by mouth daily. 90 tablet 1   aspirin EC 81 MG tablet Take 81 mg by mouth daily.     diltiazem (CARDIZEM CD) 180 MG 24 hr capsule TAKE 1 CAPSULE BY MOUTH EVERY DAY (Patient taking differently: Take 180 mg by mouth daily. TAKE 1 CAPSULE BY MOUTH EVERY DAY) 90 capsule 3   lisinopril (ZESTRIL) 20 MG tablet TAKE 1 TABLET(20 MG) BY MOUTH DAILY (Patient taking differently: Take 20 mg by mouth daily.) 90 tablet 1   loratadine (CLARITIN) 10 MG tablet Take 10 mg by mouth as needed for allergies.     metoprolol succinate (TOPROL-XL) 50 MG 24 hr tablet TAKE 1 TABLET(50 MG) BY MOUTH DAILY WITH OR IMMEDIATELY FOLLOWING A MEAL (Patient taking differently: Take 50 mg by mouth daily.) 90 tablet 2   Multiple Vitamin (MULTIVITAMIN WITH MINERALS) TABS  tablet Take 1 tablet by mouth daily. Unknown strength     omeprazole (PRILOSEC) 20 MG capsule Take 1 capsule (20 mg total) by mouth 2 (two) times daily. 180 capsule 1   polyethylene glycol (MIRALAX / GLYCOLAX) packet Take 17 g by mouth 2 (two) times daily.     rosuvastatin (CRESTOR) 10 MG tablet Take 1 tablet (10 mg total) by mouth daily. 90 tablet 3   Sennosides-Docusate Sodium (STOOL SOFTENER/LAXATIVE PO) Take 1 tablet by mouth daily. Unknown strenght     indomethacin (INDOCIN) 50 MG capsule Take 1 capsule (50 mg total) by mouth 2 (two) times daily with a meal. 60 capsule 5   No facility-administered medications prior to visit.    No Known Allergies  Review of Systems CONSTITUTIONAL: Negative for chills, fatigue, fever, unintentional weight gain and unintentional weight loss.  CARDIOVASCULAR: Negative for chest pain, dizziness, palpitations and pedal edema.  RESPIRATORY: Negative for recent cough and dyspnea.  MSK: see HPI      Objective:  PHYSICAL EXAM:   VS: BP 138/84 (BP Location: Left Arm, Patient Position: Sitting, Cuff Size: Large)    Pulse 70    Temp (!) 97.5 F (36.4 C) (Temporal)    Ht '6\' 3"'  (1.905 m)    Wt 217 lb (98.4 kg)    SpO2 96%    BMI 27.12 kg/m   GEN: Well nourished, well developed, in no acute distress  Pharynx - erythema noted Cardiac: RRR; no murmurs, rubs, or gallops,no edema -  Respiratory:  normal respiratory rate and pattern with no distress - normal breath sounds with no rales, rhonchi, wheezes or rubs MS: no deformity or atrophy - tenderness to left SI joint area Skin: warm and dry, no rash    Health Maintenance Due  Topic Date Due   Hepatitis C Screening  Never done   TETANUS/TDAP  Never done   Zoster Vaccines- Shingrix (1 of 2) Never done   Pneumonia Vaccine 67+ Years old (1 - PCV) Never done   COVID-19 Vaccine (4 - Booster for Pfizer series) 09/24/2020    There are no preventive care reminders to display for this patient.   Lab Results   Component Value Date   TSH 1.020 08/05/2021   Lab Results  Component Value Date   WBC 5.9 08/05/2021   HGB 15.8 08/05/2021   HCT 47.1 08/05/2021   MCV 92 08/05/2021   PLT 235 08/05/2021   Lab Results  Component Value Date   NA 137 08/05/2021   K 4.4 08/05/2021  CO2 23 08/05/2021   GLUCOSE 106 (H) 08/05/2021   BUN 11 08/05/2021   CREATININE 0.85 08/05/2021   BILITOT 0.6 08/05/2021   ALKPHOS 97 08/05/2021   AST 20 08/05/2021   ALT 19 08/05/2021   PROT 6.1 08/05/2021   ALBUMIN 4.5 08/05/2021   CALCIUM 9.0 08/05/2021   ANIONGAP 13 06/27/2014   EGFR 89 08/05/2021   Lab Results  Component Value Date   CHOL 185 08/05/2021   Lab Results  Component Value Date   HDL 42 08/05/2021   Lab Results  Component Value Date   LDLCALC 116 (H) 08/05/2021   Lab Results  Component Value Date   TRIG 149 08/05/2021   Lab Results  Component Value Date   CHOLHDL 4.4 08/05/2021   Lab Results  Component Value Date   HGBA1C 6.1 (H) 06/27/2014       Assessment & Plan:   Problem List Items Addressed This Visit   None Visit Diagnoses     Left hip pain    -  Primary   Relevant Medications   meloxicam (MOBIC) 7.5 MG tablet   Other Relevant Orders   Left hip xray   Acute laryngopharyngitis       Relevant Medications   azithromycin (ZITHROMAX) 250 MG tablet      Meds ordered this encounter  Medications   azithromycin (ZITHROMAX) 250 MG tablet    Sig: Take 2 tablets on day 1, then 1 tablet daily on days 2 through 5    Dispense:  6 tablet    Refill:  0    Order Specific Question:   Supervising Provider    Answer:   [name] [[id number]]   meloxicam (MOBIC) 7.5 MG tablet    Sig: Take 1 tablet (7.5 mg total) by mouth daily.    Dispense:  30 tablet    Refill:  0    Order Specific Question:   Supervising Provider    AnswerShelton Silvas    Orders Placed This Encounter  Procedures   DG Arthro Hip Left     Follow-up: Return if symptoms worsen or fail to  improve.  An After Visit Summary was printed and given to the patient.  Yetta Flock [facility] [phone]

## 2021-12-23 DIAGNOSIS — G4733 Obstructive sleep apnea (adult) (pediatric): Secondary | ICD-10-CM | POA: Diagnosis not present

## 2021-12-24 ENCOUNTER — Other Ambulatory Visit: Payer: Self-pay | Admitting: Physician Assistant

## 2021-12-24 ENCOUNTER — Other Ambulatory Visit: Payer: Self-pay

## 2021-12-24 DIAGNOSIS — M25552 Pain in left hip: Secondary | ICD-10-CM

## 2021-12-25 ENCOUNTER — Telehealth: Payer: Self-pay | Admitting: Physician Assistant

## 2021-12-25 NOTE — Telephone Encounter (Signed)
? ?  Adam Bennett has been scheduled for the following appointment: ? ?WHAT: MRI Left Hip ?WHERE: [facility] ?DATE: 12/30/21 ?TIME: 2:45 PM arrival time ? ?Patient has been made aware. ?Spoke to [place] pt's wife. ?

## 2021-12-26 DIAGNOSIS — S76212A Strain of adductor muscle, fascia and tendon of left thigh, initial encounter: Secondary | ICD-10-CM | POA: Diagnosis not present

## 2021-12-26 DIAGNOSIS — R296 Repeated falls: Secondary | ICD-10-CM | POA: Diagnosis not present

## 2021-12-26 DIAGNOSIS — S76312A Strain of muscle, fascia and tendon of the posterior muscle group at thigh level, left thigh, initial encounter: Secondary | ICD-10-CM | POA: Diagnosis not present

## 2021-12-26 DIAGNOSIS — M25552 Pain in left hip: Secondary | ICD-10-CM | POA: Diagnosis not present

## 2021-12-29 ENCOUNTER — Other Ambulatory Visit: Payer: Self-pay

## 2021-12-29 ENCOUNTER — Other Ambulatory Visit: Payer: Self-pay | Admitting: Physician Assistant

## 2021-12-29 DIAGNOSIS — M25552 Pain in left hip: Secondary | ICD-10-CM

## 2021-12-29 DIAGNOSIS — M25559 Pain in unspecified hip: Secondary | ICD-10-CM

## 2021-12-29 DIAGNOSIS — S76312A Strain of muscle, fascia and tendon of the posterior muscle group at thigh level, left thigh, initial encounter: Secondary | ICD-10-CM

## 2021-12-31 ENCOUNTER — Other Ambulatory Visit: Payer: Self-pay | Admitting: Physician Assistant

## 2021-12-31 DIAGNOSIS — N400 Enlarged prostate without lower urinary tract symptoms: Secondary | ICD-10-CM

## 2022-01-07 ENCOUNTER — Other Ambulatory Visit: Payer: Self-pay | Admitting: Physician Assistant

## 2022-01-07 DIAGNOSIS — K219 Gastro-esophageal reflux disease without esophagitis: Secondary | ICD-10-CM

## 2022-01-08 DIAGNOSIS — M5416 Radiculopathy, lumbar region: Secondary | ICD-10-CM | POA: Diagnosis not present

## 2022-01-08 DIAGNOSIS — Z981 Arthrodesis status: Secondary | ICD-10-CM | POA: Diagnosis not present

## 2022-01-13 DIAGNOSIS — Z9989 Dependence on other enabling machines and devices: Secondary | ICD-10-CM | POA: Diagnosis not present

## 2022-01-13 DIAGNOSIS — M5116 Intervertebral disc disorders with radiculopathy, lumbar region: Secondary | ICD-10-CM | POA: Diagnosis not present

## 2022-01-13 DIAGNOSIS — G4733 Obstructive sleep apnea (adult) (pediatric): Secondary | ICD-10-CM | POA: Diagnosis not present

## 2022-01-13 DIAGNOSIS — M4726 Other spondylosis with radiculopathy, lumbar region: Secondary | ICD-10-CM | POA: Diagnosis not present

## 2022-01-14 ENCOUNTER — Other Ambulatory Visit: Payer: Self-pay | Admitting: Physician Assistant

## 2022-01-14 DIAGNOSIS — M25552 Pain in left hip: Secondary | ICD-10-CM

## 2022-01-20 DIAGNOSIS — M48062 Spinal stenosis, lumbar region with neurogenic claudication: Secondary | ICD-10-CM | POA: Diagnosis not present

## 2022-01-20 DIAGNOSIS — M5416 Radiculopathy, lumbar region: Secondary | ICD-10-CM | POA: Diagnosis not present

## 2022-01-20 DIAGNOSIS — M48061 Spinal stenosis, lumbar region without neurogenic claudication: Secondary | ICD-10-CM | POA: Diagnosis not present

## 2022-01-20 DIAGNOSIS — I10 Essential (primary) hypertension: Secondary | ICD-10-CM | POA: Diagnosis not present

## 2022-01-28 ENCOUNTER — Other Ambulatory Visit: Payer: Self-pay | Admitting: Cardiology

## 2022-02-03 ENCOUNTER — Ambulatory Visit: Payer: Medicare Other | Admitting: Physician Assistant

## 2022-02-11 ENCOUNTER — Other Ambulatory Visit: Payer: Self-pay | Admitting: Physician Assistant

## 2022-02-11 DIAGNOSIS — M25552 Pain in left hip: Secondary | ICD-10-CM

## 2022-03-02 ENCOUNTER — Encounter: Payer: Self-pay | Admitting: Physician Assistant

## 2022-03-02 ENCOUNTER — Ambulatory Visit (INDEPENDENT_AMBULATORY_CARE_PROVIDER_SITE_OTHER): Payer: Medicare Other | Admitting: Physician Assistant

## 2022-03-02 VITALS — BP 128/82 | HR 63 | Temp 98.5°F | Ht 75.0 in | Wt 216.6 lb

## 2022-03-02 DIAGNOSIS — Z23 Encounter for immunization: Secondary | ICD-10-CM

## 2022-03-02 DIAGNOSIS — L03114 Cellulitis of left upper limb: Secondary | ICD-10-CM | POA: Diagnosis not present

## 2022-03-02 MED ORDER — AMOXICILLIN-POT CLAVULANATE 875-125 MG PO TABS: 1.0000 | ORAL_TABLET | Freq: Two times a day (BID) | ORAL | 0 refills | Status: DC

## 2022-03-02 NOTE — Progress Notes (Signed)
? ?Acute Office Visit ? ?Subjective:  ? ? Patient ID: Adam HOFMANN, male    DOB: [birth year], 81 y.o.   MRN: [id number] ? ?Chief Complaint  ?Patient presents with  ? Animal Bite  ? ? ?HPI: ?Patient is in today for complaints of bite to left hand.  States he grabbed his dog (who is up to date on shots) while it was stalking a lizard and it bit him - thinks he startled dog.  ?Pt is also due for tetanus booster ?Denies fever/malaise ?Past Medical History:  ?Diagnosis Date  ? A-fib ([place])   ? Anal bleeding 02/03/2021  ? At increased risk of exposure to COVID-19 virus 10/21/2020  ? Atrial fibrillation ([place]) 06/25/2014  ? Bilateral carpal tunnel syndrome 02/22/2018  ? Bronchial pneumonia 10/21/2020  ? Cervical radiculopathy at C7 02/22/2018  ? Left  ? Chronic sinus complaints   ? Dilated cardiomyopathy ([place])   ? Essential hypertension, benign 06/25/2014  ? GERD (gastroesophageal reflux disease)   ? Hematochezia 02/05/2020  ? Hypertension   ? Hypotension, unspecified 06/25/2014  ? NSVT (nonsustained ventricular tachycardia) ([place])   ? Obstructive sleep apnea 05/08/2015  ? Pulmonary hypertension ([place]) 05/08/2015  ? Special screening examination for viral disease 02/05/2020  ? Spondylolisthesis of lumbar region 06/22/2014  ? Syncope 06/25/2014  ? Ulnar neuropathy at elbow 02/22/2018  ? Bilateral  ? ? ?Past Surgical History:  ?Procedure Laterality Date  ? BACK SURGERY    ? CARDIAC CATHETERIZATION    ? DONE IN [place], 58YRS AGO  ? CHOLECYSTECTOMY    ? FOOT SURGERY    ? HEMORRHOID SURGERY    ? HERNIA REPAIR    ? JOINT REPLACEMENT    ? BILATER KNEE REPLACEMENTS  ? TRANSURETHRAL RESECTION OF PROSTATE    ? ? ?Family History  ?Problem Relation Age of Onset  ? Congestive Heart Failure Father   ? CAD Father   ? Congestive Heart Failure Brother   ? CAD Brother   ? Hypertension Other   ? Diabetes Other   ? Colon cancer Mother   ? ? ?Social History  ? ?Socioeconomic History  ? Marital status: Married  ?  Spouse name: Not on file  ? Number of children:  Not on file  ? Years of education: Not on file  ? Highest education level: Not on file  ?Occupational History  ? Not on file  ?Tobacco Use  ? Smoking status: Never  ? Smokeless tobacco: Never  ?Vaping Use  ? Vaping Use: Never used  ?Substance and Sexual Activity  ? Alcohol use: Yes  ?  Comment: occasional/rare  ? Drug use: No  ? Sexual activity: Not on file  ?Other Topics Concern  ? Not on file  ?Social History Narrative  ? Not on file  ? ?Social Determinants of Health  ? ?Financial Resource Strain: Not on file  ?Food Insecurity: Not on file  ?Transportation Needs: Not on file  ?Physical Activity: Not on file  ?Stress: Not on file  ?Social Connections: Not on file  ?Intimate Partner Violence: Not on file  ? ? ?Outpatient Medications Prior to Visit  ?Medication Sig Dispense Refill  ? acetaminophen (TYLENOL) 650 MG CR tablet Take 650 mg by mouth as needed for pain.    ? allopurinol (ZYLOPRIM) 100 MG tablet Take 1 tablet (100 mg total) by mouth daily. 90 tablet 1  ? aspirin EC 81 MG tablet Take 81 mg by mouth daily.    ? diltiazem (CARDIZEM CD) 180  MG 24 hr capsule TAKE 1 CAPSULE BY MOUTH EVERY DAY (Patient taking differently: Take 180 mg by mouth daily. TAKE 1 CAPSULE BY MOUTH EVERY DAY) 90 capsule 3  ? lisinopril (ZESTRIL) 20 MG tablet Take 1 tablet (20 mg total) by mouth daily. 90 tablet 2  ? loratadine (CLARITIN) 10 MG tablet Take 10 mg by mouth as needed for allergies.    ? meloxicam (MOBIC) 7.5 MG tablet TAKE 1 TABLET(7.5 MG) BY MOUTH DAILY 30 tablet 0  ? metoprolol succinate (TOPROL-XL) 50 MG 24 hr tablet TAKE 1 TABLET(50 MG) BY MOUTH DAILY WITH OR IMMEDIATELY FOLLOWING A MEAL (Patient taking differently: Take 50 mg by mouth daily.) 90 tablet 2  ? Multiple Vitamin (MULTIVITAMIN WITH MINERALS) TABS tablet Take 1 tablet by mouth daily. Unknown strength    ? omeprazole (PRILOSEC) 20 MG capsule TAKE 1 CAPSULE(20 MG) BY MOUTH TWICE DAILY 180 capsule 1  ? polyethylene glycol (MIRALAX / GLYCOLAX) packet Take 17 g by  mouth 2 (two) times daily.    ? rosuvastatin (CRESTOR) 10 MG tablet Take 1 tablet (10 mg total) by mouth daily. 90 tablet 3  ? Sennosides-Docusate Sodium (STOOL SOFTENER/LAXATIVE PO) Take 1 tablet by mouth daily. Unknown strenght    ? ?No facility-administered medications prior to visit.  ? ? ?No Known Allergies ? ?Review of Systems ?CONSTITUTIONAL: Negative for chills, fatigue, fever, ?CARDIOVASCULAR: Negative for chest pain,  ?RESPIRATORY: Negative for recent cough and dyspnea.  ?INTEGUMENTARY:see HPI ?   ? ?   ?Objective:  ?  ?Physical Exam ?PHYSICAL EXAM:  ? ?VS: BP 128/82   Pulse 63   Temp 98.5 ?F (36.9 ?C)   Ht _0  (1.905 m)   Wt 216 lb 9.6 oz (98.2 kg)   SpO2 98%   BMI 27.07 kg/m?  ? ?GEN: Well nourished, well developed, in no acute distress  ?Cardiac: RRR; no murmurs,  ?Respiratory:  normal respiratory rate and pattern with no distress - normal breath sounds with no rales, rhonchi, wheezes or rubs ?Skin: superficial wounds noted with erythema and bruising ? ? ?BP 128/82   Pulse 63   Temp 98.5 ?F (36.9 ?C)   Ht _1  (1.905 m)   Wt 216 lb 9.6 oz (98.2 kg)   SpO2 98%   BMI 27.07 kg/m?  ?Wt Readings from Last 3 Encounters:  ?03/02/22 216 lb 9.6 oz (98.2 kg)  ?12/22/21 217 lb (98.4 kg)  ?12/19/21 217 lb 11.2 oz (98.7 kg)  ? ? ?Health Maintenance Due  ?Topic Date Due  ? Hepatitis C Screening  Never done  ? Zoster Vaccines- Shingrix (1 of 2) Never done  ? Pneumonia Vaccine 59+ Years old (1 - PCV) Never done  ? COVID-19 Vaccine (4 - Booster for Pfizer series) 09/24/2020  ? ? ?There are no preventive care reminders to display for this patient. ? ? ?Lab Results  ?Component Value Date  ? TSH 1.020 08/05/2021  ? ?Lab Results  ?Component Value Date  ? WBC 5.9 08/05/2021  ? HGB 15.8 08/05/2021  ? HCT 47.1 08/05/2021  ? MCV 92 08/05/2021  ? PLT 235 08/05/2021  ? ?Lab Results  ?Component Value Date  ? NA 137 08/05/2021  ? K 4.4 08/05/2021  ? CO2 23 08/05/2021  ? GLUCOSE 106 (H) 08/05/2021  ? BUN 11 08/05/2021   ? CREATININE 0.85 08/05/2021  ? BILITOT 0.6 08/05/2021  ? ALKPHOS 97 08/05/2021  ? AST 20 08/05/2021  ? ALT 19 08/05/2021  ? PROT 6.1 08/05/2021  ?  ALBUMIN 4.5 08/05/2021  ? CALCIUM 9.0 08/05/2021  ? ANIONGAP 13 06/27/2014  ? EGFR 89 08/05/2021  ? ?Lab Results  ?Component Value Date  ? CHOL 185 08/05/2021  ? ?Lab Results  ?Component Value Date  ? HDL 42 08/05/2021  ? ?Lab Results  ?Component Value Date  ? LDLCALC 116 (H) 08/05/2021  ? ?Lab Results  ?Component Value Date  ? TRIG 149 08/05/2021  ? ?Lab Results  ?Component Value Date  ? CHOLHDL 4.4 08/05/2021  ? ?Lab Results  ?Component Value Date  ? HGBA1C 6.1 (H) 06/27/2014  ? ? ?   ?Assessment & Plan:  ? ?Problem List Items Addressed This Visit   ?None ?Visit Diagnoses   ? ? Cellulitis of left hand    -  Primary  ? Relevant Medications  ? amoxicillin-clavulanate (AUGMENTIN) 875-125 MG tablet  ? Other Relevant Orders  ? Tdap vaccine greater than or equal to 7yo IM (Completed)  ? ?  ? ?Meds ordered this encounter  ?Medications  ? amoxicillin-clavulanate (AUGMENTIN) 875-125 MG tablet  ?  Sig: Take 1 tablet by mouth 2 (two) times daily.  ?  Dispense:  20 tablet  ?  Refill:  0  ?  Order Specific Question:   Supervising Provider  ?  AnswerKOLBI, ALTADONNA [[id number]]  ? ? ?Orders Placed This Encounter  ?Procedures  ? Tdap vaccine greater than or equal to 7yo IM  ?  ? ?Follow-up: Return if symptoms worsen or fail to improve. ? ?An After Visit Summary was printed and given to the patient. ? ?SARA R Seerat Peaden, PA-C ?[facility] ?[phone] ?

## 2022-03-11 ENCOUNTER — Other Ambulatory Visit: Payer: Self-pay | Admitting: Physician Assistant

## 2022-03-11 DIAGNOSIS — M25552 Pain in left hip: Secondary | ICD-10-CM

## 2022-03-25 DIAGNOSIS — G4733 Obstructive sleep apnea (adult) (pediatric): Secondary | ICD-10-CM | POA: Diagnosis not present

## 2022-04-20 ENCOUNTER — Encounter: Payer: Self-pay | Admitting: Physician Assistant

## 2022-04-20 ENCOUNTER — Ambulatory Visit (INDEPENDENT_AMBULATORY_CARE_PROVIDER_SITE_OTHER): Payer: Medicare Other | Admitting: Physician Assistant

## 2022-04-20 VITALS — BP 140/70 | HR 70 | Temp 97.3°F | Ht 75.0 in | Wt 214.2 lb

## 2022-04-20 DIAGNOSIS — J060 Acute laryngopharyngitis: Secondary | ICD-10-CM

## 2022-04-20 MED ORDER — AZITHROMYCIN 250 MG PO TABS: ORAL_TABLET | ORAL | 0 refills | Status: AC

## 2022-04-20 MED ORDER — TRIAMCINOLONE ACETONIDE 40 MG/ML IJ SUSP
80.0000 mg | Freq: Once | INTRAMUSCULAR | Status: AC
  Administered 2022-04-20: 80 mg via INTRAMUSCULAR

## 2022-04-20 NOTE — Progress Notes (Signed)
Acute Office Visit  Subjective:    Patient ID: Adam Bennett, male    DOB: [birth year], 81 y.o.   MRN: [id number]  Chief Complaint  Patient presents with   Sinusitis    Rt ear pain, rt side facial pain, congested,  productive cough-yellowish in color, PND x 2 weeks    HPI: Patient is in today for complaints of sinus congestion , sore throat and slight cough for the past few weeks - seems to have worsened in past few days   Past Medical History:  Diagnosis Date   A-fib ([facility])    Anal bleeding 02/03/2021   At increased risk of exposure to COVID-19 virus 10/21/2020   Atrial fibrillation ([facility]) 06/25/2014   Bilateral carpal tunnel syndrome 02/22/2018   Bronchial pneumonia 10/21/2020   Cervical radiculopathy at C7 02/22/2018   Left   Chronic sinus complaints    Dilated cardiomyopathy ([facility])    Essential hypertension, benign 06/25/2014   GERD (gastroesophageal reflux disease)    Hematochezia 02/05/2020   Hypertension    Hypotension, unspecified 06/25/2014   NSVT (nonsustained ventricular tachycardia) ([facility])    Obstructive sleep apnea 05/08/2015   Pulmonary hypertension ([facility]) 05/08/2015   Special screening examination for viral disease 02/05/2020   Spondylolisthesis of lumbar region 06/22/2014   Syncope 06/25/2014   Ulnar neuropathy at elbow 02/22/2018   Bilateral    Past Surgical History:  Procedure Laterality Date   BACK SURGERY     CARDIAC CATHETERIZATION     DONE IN [place], 64YRS AGO   CHOLECYSTECTOMY     FOOT SURGERY     HEMORRHOID SURGERY     HERNIA REPAIR     JOINT REPLACEMENT     BILATER KNEE REPLACEMENTS   TRANSURETHRAL RESECTION OF PROSTATE      Family History  Problem Relation Age of Onset   Congestive Heart Failure Father    CAD Father    Congestive Heart Failure Brother    CAD Brother    Hypertension Other    Diabetes Other    Colon cancer Mother     Social History   Socioeconomic History   Marital status: Married    Spouse name: Not on file   Number of  children: Not on file   Years of education: Not on file   Highest education level: Not on file  Occupational History   Not on file  Tobacco Use   Smoking status: Never   Smokeless tobacco: Never  Vaping Use   Vaping Use: Never used  Substance and Sexual Activity   Alcohol use: Yes    Comment: occasional/rare   Drug use: No   Sexual activity: Not on file  Other Topics Concern   Not on file  Social History Narrative   Not on file   Social Determinants of Health   Financial Resource Strain: Not on file  Food Insecurity: Not on file  Transportation Needs: Not on file  Physical Activity: Not on file  Stress: Not on file  Social Connections: Not on file  Intimate Partner Violence: Not on file    Outpatient Medications Prior to Visit  Medication Sig Dispense Refill   acetaminophen (TYLENOL) 650 MG CR tablet Take 650 mg by mouth as needed for pain.     allopurinol (ZYLOPRIM) 100 MG tablet Take 1 tablet (100 mg total) by mouth daily. 90 tablet 1   aspirin EC 81 MG tablet Take 81 mg by mouth daily.     diltiazem (CARDIZEM CD)  180 MG 24 hr capsule TAKE 1 CAPSULE BY MOUTH EVERY DAY (Patient taking differently: Take 180 mg by mouth daily. TAKE 1 CAPSULE BY MOUTH EVERY DAY) 90 capsule 3   lisinopril (ZESTRIL) 20 MG tablet Take 1 tablet (20 mg total) by mouth daily. 90 tablet 2   loratadine (CLARITIN) 10 MG tablet Take 10 mg by mouth as needed for allergies.     meloxicam (MOBIC) 7.5 MG tablet TAKE 1 TABLET(7.5 MG) BY MOUTH DAILY 90 tablet 0   metoprolol succinate (TOPROL-XL) 50 MG 24 hr tablet TAKE 1 TABLET(50 MG) BY MOUTH DAILY WITH OR IMMEDIATELY FOLLOWING A MEAL (Patient taking differently: Take 50 mg by mouth daily.) 90 tablet 2   Multiple Vitamin (MULTIVITAMIN WITH MINERALS) TABS tablet Take 1 tablet by mouth daily. Unknown strength     omeprazole (PRILOSEC) 20 MG capsule TAKE 1 CAPSULE(20 MG) BY MOUTH TWICE DAILY 180 capsule 1   polyethylene glycol (MIRALAX / GLYCOLAX) packet Take  17 g by mouth 2 (two) times daily.     rosuvastatin (CRESTOR) 10 MG tablet Take 1 tablet (10 mg total) by mouth daily. 90 tablet 3   Sennosides-Docusate Sodium (STOOL SOFTENER/LAXATIVE PO) Take 1 tablet by mouth daily. Unknown strenght     amoxicillin-clavulanate (AUGMENTIN) 875-125 MG tablet Take 1 tablet by mouth 2 (two) times daily. 20 tablet 0   No facility-administered medications prior to visit.    No Known Allergies  Review of Systems CONSTITUTIONAL: Negative for chills, fatigue, fever, unintentional weight gain and unintentional weight loss.  ENT - see HPI CARDIOVASCULAR: Negative for chest pain,  RESPIRATORY: see HPI         Objective:  PHYSICAL EXAM:   VS: BP 140/70   Pulse 70   Temp (!) 97.3 F (36.3 C)   Ht 6\' 3"  (1.905 m)   Wt 214 lb 3.2 oz (97.2 kg)   SpO2 98%   BMI 26.77 kg/m   GEN: Well nourished, well developed, in no acute distress  HEENT: normal external ears and nose - normal external auditory canals and TMS -  - normal nasal mucosa and septum - Lips, Teeth and Gums - normal  Oropharynx -erythema/pnd  Cardiac: RRR; no murmurs, rubs,  Respiratory:  normal respiratory rate and pattern with no distress - normal breath sounds with no rales, rhonchi, wheezes or rubs   Health Maintenance Due  Topic Date Due   Hepatitis C Screening  Never done   Zoster Vaccines- Shingrix (1 of 2) Never done   Pneumonia Vaccine 14+ Years old (1 - PCV) Never done   COVID-19 Vaccine (4 - Booster for Pfizer series) 09/24/2020    There are no preventive care reminders to display for this patient.   Lab Results  Component Value Date   TSH 1.020 08/05/2021   Lab Results  Component Value Date   WBC 5.9 08/05/2021   HGB 15.8 08/05/2021   HCT 47.1 08/05/2021   MCV 92 08/05/2021   PLT 235 08/05/2021   Lab Results  Component Value Date   NA 137 08/05/2021   K 4.4 08/05/2021   CO2 23 08/05/2021   GLUCOSE 106 (H) 08/05/2021   BUN 11 08/05/2021   CREATININE 0.85  08/05/2021   BILITOT 0.6 08/05/2021   ALKPHOS 97 08/05/2021   AST 20 08/05/2021   ALT 19 08/05/2021   PROT 6.1 08/05/2021   ALBUMIN 4.5 08/05/2021   CALCIUM 9.0 08/05/2021   ANIONGAP 13 06/27/2014   EGFR 89 08/05/2021   Lab  Results  Component Value Date   CHOL 185 08/05/2021   Lab Results  Component Value Date   HDL 42 08/05/2021   Lab Results  Component Value Date   LDLCALC 116 (H) 08/05/2021   Lab Results  Component Value Date   TRIG 149 08/05/2021   Lab Results  Component Value Date   CHOLHDL 4.4 08/05/2021   Lab Results  Component Value Date   HGBA1C 6.1 (H) 06/27/2014       Assessment & Plan:   Problem List Items Addressed This Visit   None Visit Diagnoses     Acute laryngopharyngitis    -  Primary   Relevant Medications   triamcinolone acetonide (KENALOG-40) injection 80 mg (Completed)   azithromycin (ZITHROMAX) 250 MG tablet      Meds ordered this encounter  Medications   triamcinolone acetonide (KENALOG-40) injection 80 mg   azithromycin (ZITHROMAX) 250 MG tablet    Sig: Take 2 tablets on day 1, then 1 tablet daily on days 2 through 5    Dispense:  6 tablet    Refill:  0    Order Specific Question:   Supervising Provider    Answer:   Blane Ohara [[id number]]    No orders of the defined types were placed in this encounter.    Follow-up: No follow-ups on file.  An After Visit Summary was printed and given to the patient.  Jettie Pagan [facility] [phone]

## 2022-04-21 ENCOUNTER — Telehealth: Payer: Self-pay

## 2022-04-21 ENCOUNTER — Other Ambulatory Visit: Payer: Self-pay | Admitting: Physician Assistant

## 2022-04-21 MED ORDER — HYDROCODONE BIT-HOMATROP MBR 5-1.5 MG/5ML PO SOLN: 5.0000 mL | Freq: Four times a day (QID) | ORAL | 0 refills | Status: DC | PRN

## 2022-04-21 NOTE — Telephone Encounter (Signed)
Patient's wife informed

## 2022-04-28 ENCOUNTER — Other Ambulatory Visit: Payer: Self-pay | Admitting: Physician Assistant

## 2022-04-28 DIAGNOSIS — M1A00X0 Idiopathic chronic gout, unspecified site, without tophus (tophi): Secondary | ICD-10-CM

## 2022-05-26 ENCOUNTER — Encounter: Payer: Self-pay | Admitting: Physician Assistant

## 2022-05-26 ENCOUNTER — Ambulatory Visit (INDEPENDENT_AMBULATORY_CARE_PROVIDER_SITE_OTHER): Payer: Medicare Other | Admitting: Physician Assistant

## 2022-05-26 VITALS — BP 164/88 | HR 62 | Temp 98.0°F | Resp 18 | Ht 75.0 in | Wt 209.4 lb

## 2022-05-26 DIAGNOSIS — M1A00X0 Idiopathic chronic gout, unspecified site, without tophus (tophi): Secondary | ICD-10-CM | POA: Diagnosis not present

## 2022-05-26 DIAGNOSIS — I10 Essential (primary) hypertension: Secondary | ICD-10-CM

## 2022-05-26 DIAGNOSIS — K219 Gastro-esophageal reflux disease without esophagitis: Secondary | ICD-10-CM | POA: Diagnosis not present

## 2022-05-26 DIAGNOSIS — I4811 Longstanding persistent atrial fibrillation: Secondary | ICD-10-CM

## 2022-05-26 DIAGNOSIS — E782 Mixed hyperlipidemia: Secondary | ICD-10-CM

## 2022-05-26 NOTE — Progress Notes (Signed)
Subjective:  Patient ID: Adam Bennett, male    DOB: 1940/11/14  Age: 81 y.o. MRN: [id number]  Chief Complaint  Patient presents with   Hypertension    HPI  Pt presents for follow up of hypertension. The patient is tolerating the medication well without side effects. Compliance with treatment has been good; including taking medication as directed , maintains a healthy diet and regular exercise regimen , and following up as directed. States at home bp has been stable but is elevated today - he has not taken medication yet this morning Does have cardiology follow up as well this month Currently on cardizem CD 180, Toprol XL 50 and zestril '20mg'$  qd  Pt with history of gout - stable on daily allopurinol  Pt with history of GERD - symptoms controlled with prilosec '20mg'$  bid  Mixed hyperlipidemia  Pt presents with hyperlipidemia. Compliance with treatment has been good - The patient is compliant with medications, maintains a low cholesterol diet , follows up as directed , and maintains an exercise regimen . The patient denies experiencing any hypercholesterolemia related symptoms. Pt currently on crestor '10mg'$  qd    Current Outpatient Medications on File Prior to Visit  Medication Sig Dispense Refill   acetaminophen (TYLENOL) 650 MG CR tablet Take 650 mg by mouth as needed for pain.     allopurinol (ZYLOPRIM) 100 MG tablet Take 1 tablet (100 mg total) by mouth daily. 90 tablet 1   aspirin EC 81 MG tablet Take 81 mg by mouth daily.     diltiazem (CARDIZEM CD) 180 MG 24 hr capsule TAKE 1 CAPSULE BY MOUTH EVERY DAY (Patient taking differently: Take 180 mg by mouth daily. TAKE 1 CAPSULE BY MOUTH EVERY DAY) 90 capsule 3   HYDROcodone bit-homatropine (HYDROMET) 5-1.5 MG/5ML syrup Take 5 mLs by mouth every 6 (six) hours as needed for cough. 120 mL 0   lisinopril (ZESTRIL) 20 MG tablet Take 1 tablet (20 mg total) by mouth daily. 90 tablet 2   loratadine (CLARITIN) 10 MG tablet Take 10 mg by mouth as  needed for allergies.     meloxicam (MOBIC) 7.5 MG tablet TAKE 1 TABLET(7.5 MG) BY MOUTH DAILY 90 tablet 0   metoprolol succinate (TOPROL-XL) 50 MG 24 hr tablet TAKE 1 TABLET(50 MG) BY MOUTH DAILY WITH OR IMMEDIATELY FOLLOWING A MEAL (Patient taking differently: Take 50 mg by mouth daily.) 90 tablet 2   Multiple Vitamin (MULTIVITAMIN WITH MINERALS) TABS tablet Take 1 tablet by mouth daily. Unknown strength     omeprazole (PRILOSEC) 20 MG capsule TAKE 1 CAPSULE(20 MG) BY MOUTH TWICE DAILY 180 capsule 1   polyethylene glycol (MIRALAX / GLYCOLAX) packet Take 17 g by mouth 2 (two) times daily.     Sennosides-Docusate Sodium (STOOL SOFTENER/LAXATIVE PO) Take 1 tablet by mouth daily. Unknown strenght     rosuvastatin (CRESTOR) 10 MG tablet Take 1 tablet (10 mg total) by mouth daily. 90 tablet 3   No current facility-administered medications on file prior to visit.   Past Medical History:  Diagnosis Date   A-fib [facility])    Anal bleeding 02/03/2021   At increased risk of exposure to COVID-19 virus 10/21/2020   Atrial fibrillation ([place]) 06/25/2014   Bilateral carpal tunnel syndrome 02/22/2018   Bronchial pneumonia 10/21/2020   Cervical radiculopathy at C7 02/22/2018   Left   Chronic sinus complaints    Dilated cardiomyopathy ([place])    Essential hypertension, benign 06/25/2014   GERD (gastroesophageal reflux disease)  Hematochezia 02/05/2020   Hypertension    Hypotension, unspecified 06/25/2014   NSVT (nonsustained ventricular tachycardia) ([place])    Obstructive sleep apnea 05/08/2015   Pulmonary hypertension ([place]) 05/08/2015   Special screening examination for viral disease 02/05/2020   Spondylolisthesis of lumbar region 06/22/2014   Syncope 06/25/2014   Ulnar neuropathy at elbow 02/22/2018   Bilateral   Past Surgical History:  Procedure Laterality Date   BACK SURGERY     CARDIAC CATHETERIZATION     DONE IN [place], 49YRS AGO   CHOLECYSTECTOMY     FOOT SURGERY     HEMORRHOID SURGERY     HERNIA  REPAIR     JOINT REPLACEMENT     BILATER KNEE REPLACEMENTS   TRANSURETHRAL RESECTION OF PROSTATE      Family History  Problem Relation Age of Onset   Congestive Heart Failure Father    CAD Father    Congestive Heart Failure Brother    CAD Brother    Hypertension Other    Diabetes Other    Colon cancer Mother    Social History   Socioeconomic History   Marital status: Married    Spouse name: Not on file   Number of children: Not on file   Years of education: Not on file   Highest education level: Not on file  Occupational History   Not on file  Tobacco Use   Smoking status: Never   Smokeless tobacco: Never  Vaping Use   Vaping Use: Never used  Substance and Sexual Activity   Alcohol use: Yes    Comment: occasional/rare   Drug use: No   Sexual activity: Not on file  Other Topics Concern   Not on file  Social History Narrative   Not on file   Social Determinants of Health   Financial Resource Strain: Not on file  Food Insecurity: Not on file  Transportation Needs: Not on file  Physical Activity: Not on file  Stress: Not on file  Social Connections: Not on file    Review of Systems CONSTITUTIONAL: Negative for chills, fatigue, fever, unintentional weight gain and unintentional weight loss.  E/N/T: Negative for ear pain, nasal congestion and sore throat.  CARDIOVASCULAR: Negative for chest pain, dizziness, palpitations and pedal edema.  RESPIRATORY: Negative for recent cough and dyspnea.  GASTROINTESTINAL: Negative for abdominal pain, acid reflux symptoms, constipation, diarrhea, nausea and vomiting.  MSK: Negative for arthralgias and myalgias.  INTEGUMENTARY: Negative for rash.  NEUROLOGICAL: Negative for dizziness and headaches.  PSYCHIATRIC: Negative for sleep disturbance and to question depression screen.  Negative for depression, negative for anhedonia.       Objective:  PHYSICAL EXAM:   VS: BP (!) 164/88   Pulse 62   Temp 98 F (36.7 C)   Resp 18    Ht '6\' 3"'$  (1.905 m)   Wt 209 lb 6.4 oz (95 kg)   SpO2 96%   BMI 26.17 kg/m   GEN: Well nourished, well developed, in no acute distress   Cardiac: RRR; no murmurs,  Respiratory:  normal respiratory rate and pattern with no distress - normal breath sounds with no rales, rhonchi, wheezes or rubs  Skin: warm and dry, no rash   Psych: euthymic mood, appropriate affect and demeanor   Lab Results  Component Value Date   WBC 5.9 08/05/2021   HGB 15.8 08/05/2021   HCT 47.1 08/05/2021   PLT 235 08/05/2021   GLUCOSE 106 (H) 08/05/2021   CHOL 185 08/05/2021  TRIG 149 08/05/2021   HDL 42 08/05/2021   LDLCALC 116 (H) 08/05/2021   ALT 19 08/05/2021   AST 20 08/05/2021   NA 137 08/05/2021   K 4.4 08/05/2021   CL 100 08/05/2021   CREATININE 0.85 08/05/2021   BUN 11 08/05/2021   CO2 23 08/05/2021   TSH 1.020 08/05/2021   INR 0.93 07/02/2010   HGBA1C 6.1 (H) 06/27/2014      Assessment & Plan:   Problem List Items Addressed This Visit       Cardiovascular and Mediastinum   Essential hypertension, benign - Primary   Relevant Orders   CBC with Differential/Platelet   Comprehensive metabolic panel   TSH   Lipid panel     Digestive   Gastroesophageal reflux disease without esophagitis Continue current meds     Other   Idiopathic chronic gout without tophus   Relevant Orders   Uric Acid   Mixed hyperlipidemia   Relevant Orders   Lipid panel  .  No orders of the defined types were placed in this encounter.   Orders Placed This Encounter  Procedures   CBC with Differential/Platelet   Comprehensive metabolic panel   TSH   Lipid panel   Uric Acid     Follow-up: Return in about 6 months (around 11/26/2022) for chronic fasting follow up and in 3-4 weeks nurse visit bp check.  An After Visit Summary was printed and given to the patient.  Yetta Flock [facility] [phone]

## 2022-05-27 ENCOUNTER — Ambulatory Visit: Payer: Medicare Other | Admitting: Physician Assistant

## 2022-05-27 LAB — COMPREHENSIVE METABOLIC PANEL
ALT: 39 IU/L (ref 0–44)
AST: 20 IU/L (ref 0–40)
Albumin/Globulin Ratio: 2.3 — ABNORMAL HIGH (ref 1.2–2.2)
Albumin: 4.4 g/dL (ref 3.8–4.8)
Alkaline Phosphatase: 81 IU/L (ref 44–121)
BUN/Creatinine Ratio: 16 (ref 10–24)
BUN: 14 mg/dL (ref 8–27)
Bilirubin Total: 0.7 mg/dL (ref 0.0–1.2)
CO2: 25 mmol/L (ref 20–29)
Calcium: 9.4 mg/dL (ref 8.6–10.2)
Chloride: 101 mmol/L (ref 96–106)
Creatinine, Ser: 0.88 mg/dL (ref 0.76–1.27)
Globulin, Total: 1.9 g/dL (ref 1.5–4.5)
Glucose: 99 mg/dL (ref 70–99)
Potassium: 4 mmol/L (ref 3.5–5.2)
Sodium: 139 mmol/L (ref 134–144)
Total Protein: 6.3 g/dL (ref 6.0–8.5)
eGFR: 87 mL/min/{1.73_m2} (ref >59)

## 2022-05-27 LAB — CBC WITH DIFFERENTIAL/PLATELET
Basophils Absolute: 0 10*3/uL (ref 0.0–0.2)
Basos: 0 %
EOS (ABSOLUTE): 0.1 10*3/uL (ref 0.0–0.4)
Eos: 2 %
Hematocrit: 45.9 % (ref 37.5–51.0)
Hemoglobin: 15.8 g/dL (ref 13.0–17.7)
Immature Grans (Abs): 0 10*3/uL (ref 0.0–0.1)
Immature Granulocytes: 1 %
Lymphocytes Absolute: 0.9 10*3/uL (ref 0.7–3.1)
Lymphs: 15 %
MCH: 31.9 pg (ref 26.6–33.0)
MCHC: 34.4 g/dL (ref 31.5–35.7)
MCV: 93 fL (ref 79–97)
Monocytes Absolute: 0.4 10*3/uL (ref 0.1–0.9)
Monocytes: 7 %
Neutrophils Absolute: 4.5 10*3/uL (ref 1.4–7.0)
Neutrophils: 75 %
Platelets: 168 10*3/uL (ref 150–450)
RBC: 4.96 x10E6/uL (ref 4.14–5.80)
RDW: 13.1 % (ref 11.6–15.4)
WBC: 6 10*3/uL (ref 3.4–10.8)

## 2022-05-27 LAB — LIPID PANEL
Chol/HDL Ratio: 2.6 ratio (ref 0.0–5.0)
Cholesterol, Total: 140 mg/dL (ref 100–199)
HDL: 53 mg/dL (ref >39)
LDL Chol Calc (NIH): 67 mg/dL (ref 0–99)
Triglycerides: 112 mg/dL (ref 0–149)
VLDL Cholesterol Cal: 20 mg/dL (ref 5–40)

## 2022-05-27 LAB — URIC ACID: Uric Acid: 4.3 mg/dL (ref 3.8–8.4)

## 2022-05-27 LAB — CARDIOVASCULAR RISK ASSESSMENT

## 2022-05-27 LAB — TSH: TSH: 0.932 u[IU]/mL (ref 0.450–4.500)

## 2022-05-28 ENCOUNTER — Other Ambulatory Visit: Payer: Self-pay | Admitting: Cardiology

## 2022-06-19 ENCOUNTER — Ambulatory Visit: Payer: Medicare Other | Admitting: Cardiology

## 2022-06-19 ENCOUNTER — Other Ambulatory Visit (HOSPITAL_COMMUNITY): Payer: Self-pay

## 2022-06-19 ENCOUNTER — Encounter: Payer: Self-pay | Admitting: Cardiology

## 2022-06-19 ENCOUNTER — Other Ambulatory Visit: Payer: Self-pay

## 2022-06-19 VITALS — BP 150/92 | HR 86 | Ht 75.0 in | Wt 210.6 lb

## 2022-06-19 DIAGNOSIS — I10 Essential (primary) hypertension: Secondary | ICD-10-CM

## 2022-06-19 DIAGNOSIS — I420 Dilated cardiomyopathy: Secondary | ICD-10-CM | POA: Diagnosis not present

## 2022-06-19 DIAGNOSIS — I272 Pulmonary hypertension, unspecified: Secondary | ICD-10-CM | POA: Diagnosis not present

## 2022-06-19 DIAGNOSIS — E782 Mixed hyperlipidemia: Secondary | ICD-10-CM

## 2022-06-19 MED ORDER — DILTIAZEM HCL ER COATED BEADS 240 MG PO CP24: 240.0000 mg | ORAL_CAPSULE | Freq: Every day | ORAL | 3 refills | Status: DC

## 2022-06-19 NOTE — Patient Instructions (Addendum)
Medication Instructions:  Your physician has recommended you make the following change in your medication:   START: Cardizem CD 240 mg daily  *If you need a refill on your cardiac medications before your next appointment, please call your pharmacy*   Lab Work: None If you have labs (blood work) drawn today and your tests are completely normal, you will receive your results only by: [place] (if you have MyChart) OR A paper copy in the mail If you have any lab test that is abnormal or we need to change your treatment, we will call you to review the results.   Testing/Procedures: Your physician has requested that you have an echocardiogram. Echocardiography is a painless test that uses sound waves to create images of your heart. It provides your doctor with information about the size and shape of your heart and how well your heart's chambers and valves are working. This procedure takes approximately one hour. There are no restrictions for this procedure.    Follow-Up: At [facility], you and your health needs are our priority.  As part of our continuing mission to provide you with exceptional heart care, we have created designated Provider Care Teams.  These Care Teams include your primary Cardiologist (physician) and Advanced Practice Providers (APPs -  Physician Assistants and Nurse Practitioners) who all work together to provide you with the care you need, when you need it.  We recommend signing up for the patient portal called "MyChart".  Sign up information is provided on this After Visit Summary.  MyChart is used to connect with patients for Virtual Visits (Telemedicine).  Patients are able to view lab/test results, encounter notes, upcoming appointments, etc.  Non-urgent messages can be sent to your provider as well.   To learn more about what you can do with MyChart, go to [email or web address].    Your next appointment:   6 month(s)  The format for your next  appointment:   In Person  Provider:   Jenne Campus, MD    Other Instructions None  Important Information About Sugar

## 2022-06-19 NOTE — Progress Notes (Unsigned)
Cardiology Office Note:    Date:  06/19/2022   ID:  Adam Bennett, DOB July 19, 1941, MRN [id number]  PCP:  Marge Duncans, PA-C  Cardiologist:  Jenne Campus, MD    Referring MD: Marge Duncans, PA-C   Chief Complaint  Patient presents with   Follow-up  Doing fine  History of Present Illness:    Adam Bennett is a 81 y.o. male with past medical history significant for cardiomyopathy with normalization of left ventricle ejection fraction, last check year ago showing low limits of normal, sleep apnea, trauma, COPD, pulmonary hypertension which is mild.  1 episode of paroxysmal atrial fibrillation, but does not want to be anticoagulated. He is in my office today for follow-up.  Overall he is doing very well.  Please describe however to have some exertional shortness of breath but otherwise no chest pain tightness squeezing pressure burning chest.  Past Medical History:  Diagnosis Date   A-fib ([place])    Anal bleeding 02/03/2021   At increased risk of exposure to COVID-19 virus 10/21/2020   Atrial fibrillation ([place]) 06/25/2014   Bilateral carpal tunnel syndrome 02/22/2018   Bronchial pneumonia 10/21/2020   Cervical radiculopathy at C7 02/22/2018   Left   Chronic sinus complaints    Dilated cardiomyopathy ([place])    Essential hypertension, benign 06/25/2014   GERD (gastroesophageal reflux disease)    Hematochezia 02/05/2020   Hypertension    Hypotension, unspecified 06/25/2014   NSVT (nonsustained ventricular tachycardia) ([place])    Obstructive sleep apnea 05/08/2015   Pulmonary hypertension ([place]) 05/08/2015   Special screening examination for viral disease 02/05/2020   Spondylolisthesis of lumbar region 06/22/2014   Syncope 06/25/2014   Ulnar neuropathy at elbow 02/22/2018   Bilateral    Past Surgical History:  Procedure Laterality Date   BACK SURGERY     CARDIAC CATHETERIZATION     DONE IN [place], 80YRS AGO   CHOLECYSTECTOMY     FOOT SURGERY     HEMORRHOID SURGERY     HERNIA REPAIR      JOINT REPLACEMENT     BILATER KNEE REPLACEMENTS   TRANSURETHRAL RESECTION OF PROSTATE      Current Medications: Current Meds  Medication Sig   acetaminophen (TYLENOL) 650 MG CR tablet Take 650 mg by mouth as needed for pain.   allopurinol (ZYLOPRIM) 100 MG tablet Take 1 tablet (100 mg total) by mouth daily.   aspirin EC 81 MG tablet Take 81 mg by mouth daily.   diltiazem (CARDIZEM CD) 180 MG 24 hr capsule TAKE 1 CAPSULE BY MOUTH EVERY DAY (Patient taking differently: Take 180 mg by mouth daily. TAKE 1 CAPSULE BY MOUTH EVERY DAY)   HYDROcodone bit-homatropine (HYDROMET) 5-1.5 MG/5ML syrup Take 5 mLs by mouth every 6 (six) hours as needed for cough.   lisinopril (ZESTRIL) 20 MG tablet Take 1 tablet (20 mg total) by mouth daily.   loratadine (CLARITIN) 10 MG tablet Take 10 mg by mouth as needed for allergies.   meloxicam (MOBIC) 7.5 MG tablet TAKE 1 TABLET(7.5 MG) BY MOUTH DAILY (Patient taking differently: Take 7.5 mg by mouth daily.)   metoprolol succinate (TOPROL-XL) 50 MG 24 hr tablet Take 1 tablet (50 mg total) by mouth daily.   Multiple Vitamin (MULTIVITAMIN WITH MINERALS) TABS tablet Take 1 tablet by mouth daily. Unknown strength   omeprazole (PRILOSEC) 20 MG capsule TAKE 1 CAPSULE(20 MG) BY MOUTH TWICE DAILY (Patient taking differently: Take 20 mg by mouth 2 (two) times daily before a meal.)  polyethylene glycol (MIRALAX / GLYCOLAX) packet Take 17 g by mouth daily as needed for mild constipation or moderate constipation.   rosuvastatin (CRESTOR) 10 MG tablet Take 1 tablet (10 mg total) by mouth daily.   Sennosides-Docusate Sodium (STOOL SOFTENER/LAXATIVE PO) Take 1 tablet by mouth daily. Unknown strenght     Allergies:   Patient has no known allergies.   Social History   Socioeconomic History   Marital status: Married    Spouse name: Not on file   Number of children: Not on file   Years of education: Not on file   Highest education level: Not on file  Occupational History    Not on file  Tobacco Use   Smoking status: Never   Smokeless tobacco: Never  Vaping Use   Vaping Use: Never used  Substance and Sexual Activity   Alcohol use: Yes    Comment: occasional/rare   Drug use: No   Sexual activity: Not on file  Other Topics Concern   Not on file  Social History Narrative   Not on file   Social Determinants of Health   Financial Resource Strain: Not on file  Food Insecurity: Not on file  Transportation Needs: Not on file  Physical Activity: Not on file  Stress: Not on file  Social Connections: Not on file     Family History: The patient's family history includes CAD in his brother and father; Colon cancer in his mother; Congestive Heart Failure in his brother and father; Diabetes in an other family member; Hypertension in an other family member. ROS:   Please see the history of present illness.    All 14 point review of systems negative except as described per history of present illness  EKGs/Labs/Other Studies Reviewed:      Recent Labs: 05/26/2022: ALT 39; BUN 14; Creatinine, Ser 0.88; Hemoglobin 15.8; Platelets 168; Potassium 4.0; Sodium 139; TSH 0.932  Recent Lipid Panel    Component Value Date/Time   CHOL 140 05/26/2022 1059   TRIG 112 05/26/2022 1059   HDL 53 05/26/2022 1059   CHOLHDL 2.6 05/26/2022 1059   LDLCALC 67 05/26/2022 1059    Physical Exam:    VS:  BP (!) 150/92 (BP Location: Left Arm, Patient Position: Sitting)   Pulse 86   Ht '6\' 3"'$  (1.905 m)   Wt 210 lb 9.6 oz (95.5 kg)   SpO2 99%   BMI 26.32 kg/m     Wt Readings from Last 3 Encounters:  06/19/22 210 lb 9.6 oz (95.5 kg)  05/26/22 209 lb 6.4 oz (95 kg)  04/20/22 214 lb 3.2 oz (97.2 kg)     GEN:  Well nourished, well developed in no acute distress HEENT: Normal NECK: No JVD; No carotid bruits LYMPHATICS: No lymphadenopathy CARDIAC: RRR, no murmurs, no rubs, no gallops RESPIRATORY:  Clear to auscultation without rales, wheezing or rhonchi  ABDOMEN: Soft,  non-tender, non-distended MUSCULOSKELETAL:  No edema; No deformity  SKIN: Warm and dry LOWER EXTREMITIES: no swelling NEUROLOGIC:  Alert and oriented x 3 PSYCHIATRIC:  Normal affect   ASSESSMENT:    1. Dilated cardiomyopathy ([place])   2. Pulmonary hypertension (HCC)   3. Essential hypertension, benign   4. Mixed hyperlipidemia    PLAN:    In order of problems listed above:  History of dilated cardiomyopathy, last estimation of ejection fraction showed lower limits of normal.  I will ask him to have echocardiogram again in the meantime we will continue ACE inhibitor as well as beta-blocker.  Essential hypertension which is uncontrolled.  I will increase dose of diltiazem from 1 80-2 40. Pulmonary hypertension.  We will do echocardiogram to check on that. Mixed dyslipidemia he is on Crestor 10 which is moderate intensity statin and he is HDL is 53 LDL 67.  We will continue present management   Medication Adjustments/Labs and Tests Ordered: Current medicines are reviewed at length with the patient today.  Concerns regarding medicines are outlined above.  No orders of the defined types were placed in this encounter.  Medication changes: No orders of the defined types were placed in this encounter.   Signed, Park Liter, MD, [facility] 06/19/2022 9:50 AM    [place]

## 2022-06-20 DIAGNOSIS — S40211A Abrasion of right shoulder, initial encounter: Secondary | ICD-10-CM | POA: Diagnosis not present

## 2022-06-20 DIAGNOSIS — S0083XA Contusion of other part of head, initial encounter: Secondary | ICD-10-CM | POA: Diagnosis not present

## 2022-06-20 DIAGNOSIS — S0081XA Abrasion of other part of head, initial encounter: Secondary | ICD-10-CM | POA: Diagnosis not present

## 2022-06-20 DIAGNOSIS — W1830XA Fall on same level, unspecified, initial encounter: Secondary | ICD-10-CM | POA: Diagnosis not present

## 2022-06-20 DIAGNOSIS — S81012A Laceration without foreign body, left knee, initial encounter: Secondary | ICD-10-CM | POA: Diagnosis not present

## 2022-06-22 ENCOUNTER — Ambulatory Visit (INDEPENDENT_AMBULATORY_CARE_PROVIDER_SITE_OTHER): Payer: Medicare Other | Admitting: Physician Assistant

## 2022-06-22 ENCOUNTER — Encounter: Payer: Self-pay | Admitting: Physician Assistant

## 2022-06-22 VITALS — BP 158/88 | HR 72 | Temp 98.2°F | Resp 18 | Ht 75.0 in | Wt 212.6 lb

## 2022-06-22 DIAGNOSIS — S81012D Laceration without foreign body, left knee, subsequent encounter: Secondary | ICD-10-CM

## 2022-06-22 DIAGNOSIS — S81012A Laceration without foreign body, left knee, initial encounter: Secondary | ICD-10-CM | POA: Insufficient documentation

## 2022-06-22 DIAGNOSIS — S0081XD Abrasion of other part of head, subsequent encounter: Secondary | ICD-10-CM | POA: Diagnosis not present

## 2022-06-22 DIAGNOSIS — S0083XA Contusion of other part of head, initial encounter: Secondary | ICD-10-CM | POA: Diagnosis not present

## 2022-06-22 DIAGNOSIS — S0012XA Contusion of left eyelid and periocular area, initial encounter: Secondary | ICD-10-CM | POA: Diagnosis not present

## 2022-06-22 DIAGNOSIS — S0993XD Unspecified injury of face, subsequent encounter: Secondary | ICD-10-CM | POA: Diagnosis not present

## 2022-06-22 DIAGNOSIS — S0512XA Contusion of eyeball and orbital tissues, left eye, initial encounter: Secondary | ICD-10-CM | POA: Diagnosis not present

## 2022-06-22 DIAGNOSIS — S0081XA Abrasion of other part of head, initial encounter: Secondary | ICD-10-CM | POA: Insufficient documentation

## 2022-06-22 DIAGNOSIS — S0993XA Unspecified injury of face, initial encounter: Secondary | ICD-10-CM | POA: Insufficient documentation

## 2022-06-22 NOTE — Progress Notes (Signed)
Acute Office Visit  Subjective:    Patient ID: Adam Bennett, male    DOB: 1941/03/28, 81 y.o.   MRN: [id number]  Chief Complaint  Patient presents with   Facial Injury    HPI: Patient is in today for follow up after a fall on Saturday morning.  Pt states was walking in a creekbed and tripped over a root falling and then fell onto several rocks.  He injured both legs , right shoulder and left arm.  He also hit a sharp rock with his right temple/right eye orbit.  He has a small puncture wound to head but moderate tenderness around wound, left eye and under left eye.  He denies vision symptoms -  He did require sutures in left knee He went to Urgent care where he was assessed but advised to follow up with our office to order CT for facial injury  Past Medical History:  Diagnosis Date   A-fib [facility])    Anal bleeding 02/03/2021   At increased risk of exposure to COVID-19 virus 10/21/2020   Atrial fibrillation ([place]) 06/25/2014   Bilateral carpal tunnel syndrome 02/22/2018   Bronchial pneumonia 10/21/2020   Cervical radiculopathy at C7 02/22/2018   Left   Chronic sinus complaints    Dilated cardiomyopathy ([place])    Essential hypertension, benign 06/25/2014   GERD (gastroesophageal reflux disease)    Hematochezia 02/05/2020   Hypertension    Hypotension, unspecified 06/25/2014   NSVT (nonsustained ventricular tachycardia) ([place])    Obstructive sleep apnea 05/08/2015   Pulmonary hypertension ([place]) 05/08/2015   Special screening examination for viral disease 02/05/2020   Spondylolisthesis of lumbar region 06/22/2014   Syncope 06/25/2014   Ulnar neuropathy at elbow 02/22/2018   Bilateral    Past Surgical History:  Procedure Laterality Date   BACK SURGERY     CARDIAC CATHETERIZATION     DONE IN [place], 58YRS AGO   CHOLECYSTECTOMY     FOOT SURGERY     HEMORRHOID SURGERY     HERNIA REPAIR     JOINT REPLACEMENT     BILATER KNEE REPLACEMENTS   TRANSURETHRAL RESECTION OF PROSTATE       Family History  Problem Relation Age of Onset   Congestive Heart Failure Father    CAD Father    Congestive Heart Failure Brother    CAD Brother    Hypertension Other    Diabetes Other    Colon cancer Mother     Social History   Socioeconomic History   Marital status: Married    Spouse name: Not on file   Number of children: Not on file   Years of education: Not on file   Highest education level: Not on file  Occupational History   Not on file  Tobacco Use   Smoking status: Never   Smokeless tobacco: Never  Vaping Use   Vaping Use: Never used  Substance and Sexual Activity   Alcohol use: Yes    Comment: occasional/rare   Drug use: No   Sexual activity: Not on file  Other Topics Concern   Not on file  Social History Narrative   Not on file   Social Determinants of Health   Financial Resource Strain: Not on file  Food Insecurity: Not on file  Transportation Needs: Not on file  Physical Activity: Not on file  Stress: Not on file  Social Connections: Not on file  Intimate Partner Violence: Not on file    Outpatient Medications Prior  to Visit  Medication Sig Dispense Refill   acetaminophen (TYLENOL) 650 MG CR tablet Take 650 mg by mouth as needed for pain.     allopurinol (ZYLOPRIM) 100 MG tablet Take 1 tablet (100 mg total) by mouth daily. 90 tablet 1   aspirin EC 81 MG tablet Take 81 mg by mouth daily.     diltiazem (CARDIZEM CD) 240 MG 24 hr capsule Take 1 capsule (240 mg total) by mouth daily. 90 capsule 3   lisinopril (ZESTRIL) 20 MG tablet Take 1 tablet (20 mg total) by mouth daily. 90 tablet 2   loratadine (CLARITIN) 10 MG tablet Take 10 mg by mouth as needed for allergies.     meloxicam (MOBIC) 7.5 MG tablet TAKE 1 TABLET(7.5 MG) BY MOUTH DAILY (Patient taking differently: Take 7.5 mg by mouth daily.) 90 tablet 0   metoprolol succinate (TOPROL-XL) 50 MG 24 hr tablet Take 1 tablet (50 mg total) by mouth daily. 90 tablet 1   Multiple Vitamin  (MULTIVITAMIN WITH MINERALS) TABS tablet Take 1 tablet by mouth daily. Unknown strength     omeprazole (PRILOSEC) 20 MG capsule TAKE 1 CAPSULE(20 MG) BY MOUTH TWICE DAILY (Patient taking differently: Take 20 mg by mouth 2 (two) times daily before a meal.) 180 capsule 1   polyethylene glycol (MIRALAX / GLYCOLAX) packet Take 17 g by mouth daily as needed for mild constipation or moderate constipation.     rosuvastatin (CRESTOR) 10 MG tablet Take 1 tablet (10 mg total) by mouth daily. 90 tablet 3   Sennosides-Docusate Sodium (STOOL SOFTENER/LAXATIVE PO) Take 1 tablet by mouth daily. Unknown strenght     HYDROcodone bit-homatropine (HYDROMET) 5-1.5 MG/5ML syrup Take 5 mLs by mouth every 6 (six) hours as needed for cough. 120 mL 0   No facility-administered medications prior to visit.    No Known Allergies  Review of Systems CONSTITUTIONAL: Negative for chills, fatigue, fever, E/N/T: Negative for ear pain, nasal congestion and sore throat.  CARDIOVASCULAR: Negative for chest pain, dizziness, palpitations  RESPIRATORY: Negative for recent cough and dyspnea.   MSK:see HPI INTEGUMENTARY: see HPI NEUROLOGICAL: Negative for dizziness and headaches. - no vision changes      Objective:  PHYSICAL EXAM:   VS: BP (!) 158/88   Pulse 72   Temp 98.2 F (36.8 C)   Resp 18   Ht _0  (1.905 m)   Wt 212 lb 9.6 oz (96.4 kg)   SpO2 97%   BMI 26.57 kg/m   GEN: Well nourished, well developed, in no acute distress  HEENT: normal external ears and nose - normal external auditory canals and TMS - hearing grossly - Lips, Teeth and Gums - normal  Pt with abrasion on forehead with swelling and bruising noted around left eye orbit with tenderness also noted below left eye Cardiac: RRR; no murmurs,  Respiratory:  normal respiratory rate and pattern with no distress - normal breath sounds with no rales, rhonchi, wheezes or rubs  MS: no deformity or atrophy  Skin:several bruises and abrasions noted on  arms/legs     Health Maintenance Due  Topic Date Due   Hepatitis C Screening  Never done   Zoster Vaccines- Shingrix (1 of 2) Never done   Pneumonia Vaccine 24+ Years old (1 - PCV) Never done   COVID-19 Vaccine (4 - Pfizer risk series) 09/24/2020   INFLUENZA VACCINE  05/26/2022    There are no preventive care reminders to display for this patient.   Lab Results  Component Value Date   TSH 0.932 05/26/2022   Lab Results  Component Value Date   WBC 6.0 05/26/2022   HGB 15.8 05/26/2022   HCT 45.9 05/26/2022   MCV 93 05/26/2022   PLT 168 05/26/2022   Lab Results  Component Value Date   NA 139 05/26/2022   K 4.0 05/26/2022   CO2 25 05/26/2022   GLUCOSE 99 05/26/2022   BUN 14 05/26/2022   CREATININE 0.88 05/26/2022   BILITOT 0.7 05/26/2022   ALKPHOS 81 05/26/2022   AST 20 05/26/2022   ALT 39 05/26/2022   PROT 6.3 05/26/2022   ALBUMIN 4.4 05/26/2022   CALCIUM 9.4 05/26/2022   ANIONGAP 13 06/27/2014   EGFR 87 05/26/2022   Lab Results  Component Value Date   CHOL 140 05/26/2022   Lab Results  Component Value Date   HDL 53 05/26/2022   Lab Results  Component Value Date   LDLCALC 67 05/26/2022   Lab Results  Component Value Date   TRIG 112 05/26/2022   Lab Results  Component Value Date   CHOLHDL 2.6 05/26/2022   Lab Results  Component Value Date   HGBA1C 6.1 (H) 06/27/2014       Assessment & Plan:   Problem List Items Addressed This Visit       Musculoskeletal and Integument   Abrasion of face - Primary     Other   Laceration of left knee Return for suture removal in 1 week   Injury of face Ct of orbit/temporal area ordered   No orders of the defined types were placed in this encounter.   No orders of the defined types were placed in this encounter.    Follow-up: Return if symptoms worsen or fail to improve.  An After Visit Summary was printed and given to the patient.  Yetta Flock [facility] [phone]

## 2022-06-23 ENCOUNTER — Encounter: Payer: Self-pay | Admitting: Physician Assistant

## 2022-06-27 ENCOUNTER — Other Ambulatory Visit: Payer: Self-pay | Admitting: Physician Assistant

## 2022-06-27 DIAGNOSIS — K219 Gastro-esophageal reflux disease without esophagitis: Secondary | ICD-10-CM

## 2022-07-01 ENCOUNTER — Ambulatory Visit: Payer: Medicare Other | Attending: Cardiology

## 2022-07-01 DIAGNOSIS — I10 Essential (primary) hypertension: Secondary | ICD-10-CM

## 2022-07-01 DIAGNOSIS — I272 Pulmonary hypertension, unspecified: Secondary | ICD-10-CM

## 2022-07-01 DIAGNOSIS — I420 Dilated cardiomyopathy: Secondary | ICD-10-CM | POA: Diagnosis not present

## 2022-07-01 DIAGNOSIS — E782 Mixed hyperlipidemia: Secondary | ICD-10-CM | POA: Diagnosis not present

## 2022-07-01 LAB — ECHOCARDIOGRAM COMPLETE
Area-P 1/2: 2.74 cm2
Calc EF: 43.3 %
S' Lateral: 4.2 cm
Single Plane A2C EF: 43.2 %
Single Plane A4C EF: 43.5 %

## 2022-07-09 ENCOUNTER — Telehealth: Payer: Self-pay

## 2022-07-09 NOTE — Telephone Encounter (Signed)
Spoke with Betty,notified of results

## 2022-07-09 NOTE — Telephone Encounter (Signed)
-----   Message from Park Liter, MD sent at 07/08/2022  9:17 AM EDT ----- Echocardiogram showed low normal ejection fraction mild left ventricle hypertrophy, moderately dilated left atrium, overall looks fine

## 2022-07-27 ENCOUNTER — Telehealth: Payer: Self-pay | Admitting: Cardiology

## 2022-07-27 DIAGNOSIS — I10 Essential (primary) hypertension: Secondary | ICD-10-CM

## 2022-07-27 NOTE — Telephone Encounter (Signed)
Called patient's wife and she reported that the patients blood pressures have been running higher than normal for him:  163/95 this morning, 164/99 yesterday, sat 137/79, 144/81, 148/86  As a result he has been having headaches and he has been taking Tylenol for the headaches. She also reports that he is feeling more tired than normal recently.

## 2022-07-27 NOTE — Telephone Encounter (Signed)
Pt c/o BP issue: STAT if pt c/o blurred vision, one-sided weakness or slurred speech  1. What are your last 5 BP readings? 163/95 this morning, 164/99 yesterday, sat 137/79, 144/81, 148/86  2. Are you having any other symptoms (ex. Dizziness, headache, blurred vision, passed out)? headache  3. What is your BP issue? Patient's wife states the patient's BP has been high. Phone: [phone]

## 2022-07-28 NOTE — Telephone Encounter (Signed)
Patient's wife called to check on status of her call.  She stated patient's BP is still high today, it was 160/98 this morning.

## 2022-07-28 NOTE — Telephone Encounter (Signed)
Spoke with spouse- per DPR- regarding pts blood pressure and increasing Lisinopril '20mg'$  to 1 1/2 tablet daily. She verbalized understanding and had no further questions. He will come for BMP ion 1 week.

## 2022-07-30 ENCOUNTER — Ambulatory Visit (INDEPENDENT_AMBULATORY_CARE_PROVIDER_SITE_OTHER): Payer: Medicare Other

## 2022-07-30 DIAGNOSIS — Z23 Encounter for immunization: Secondary | ICD-10-CM

## 2022-08-03 ENCOUNTER — Other Ambulatory Visit: Payer: Self-pay | Admitting: Physician Assistant

## 2022-08-03 DIAGNOSIS — M1A00X0 Idiopathic chronic gout, unspecified site, without tophus (tophi): Secondary | ICD-10-CM

## 2022-08-05 DIAGNOSIS — G4733 Obstructive sleep apnea (adult) (pediatric): Secondary | ICD-10-CM | POA: Diagnosis not present

## 2022-08-06 ENCOUNTER — Telehealth: Payer: Self-pay

## 2022-08-06 ENCOUNTER — Ambulatory Visit (INDEPENDENT_AMBULATORY_CARE_PROVIDER_SITE_OTHER): Payer: Medicare Other | Admitting: Nurse Practitioner

## 2022-08-06 ENCOUNTER — Encounter: Payer: Self-pay | Admitting: Nurse Practitioner

## 2022-08-06 VITALS — BP 134/80 | HR 97 | Temp 97.1°F | Ht 75.0 in | Wt 209.0 lb

## 2022-08-06 DIAGNOSIS — I10 Essential (primary) hypertension: Secondary | ICD-10-CM | POA: Diagnosis not present

## 2022-08-06 DIAGNOSIS — S81811A Laceration without foreign body, right lower leg, initial encounter: Secondary | ICD-10-CM | POA: Diagnosis not present

## 2022-08-06 DIAGNOSIS — S85141A Laceration of anterior tibial artery, right leg, initial encounter: Secondary | ICD-10-CM

## 2022-08-06 MED ORDER — SULFAMETHOXAZOLE-TRIMETHOPRIM 800-160 MG PO TABS: 1.0000 | ORAL_TABLET | Freq: Two times a day (BID) | ORAL | 0 refills | Status: DC

## 2022-08-06 NOTE — Patient Instructions (Addendum)
Take Bactrim-DS twice daily for 7 days Elevate right leg Monitor wound for infection Follow-up as needed   Laceration Care, Adult A laceration is a cut that may go through all layers of the skin. The cut may also go into the tissue that is right under the skin. Some cuts heal on their own. Other cuts need to be closed with stitches (sutures), staples, skin adhesive strips, or skin glue. Taking care of your cut lowers your risk of infection, helps your injury heal better, and may prevent scarring. General tips Keep your wound clean and dry. Do not scratch or pick at your wound. Wash your hands with soap and water for at least 20 seconds before and after touching your wound or changing your bandage (dressing). If you cannot use soap and water, use hand sanitizer. Do not usedisinfectants or antiseptics, such as rubbing alcohol, to clean your wound unless told by your doctor. If you were given a bandage, change it at least once a day, or as told by your doctor. You should also change it if it gets wet or dirty. How to take care of your cut If your doctor used stitches or staples: Keep the wound fully dry for the first 24 hours, or as told by your doctor. After that, you may take a shower or a bath. Do not soak the wound in water until after the stitches or staples have been taken out. Clean the wound once a day, or as told by your doctor. To do this: Wash the wound with soap and water. Rinse the wound with water to remove all soap. Pat the wound dry with a clean towel. Do not rub the wound. After you clean the wound, put a thin layer of antibiotic ointment, another ointment, or a nonstick bandage on it as told by your doctor. This will help to: Prevent infection. Keep the bandage from sticking to the wound. Have your stitches or staples taken out as told by your doctor. If your doctor used skin adhesive strips: Do not get the skin adhesive strips wet. You can take a shower or a bath, but keep  the wound dry. If the wound gets wet, pat it dry with a clean towel. Do not rub the wound. Skin adhesive strips fall off on their own. You can trim the strips as the wound heals. Do not take off any strips that are still stuck to the wound unless told by your doctor. The strips will fall off after a while. If your doctor used skin glue: You may take a shower or a bath, but try to keep the wound dry. Do not soak the wound in water. After you take a shower or a bath, pat the wound dry with a clean towel. Do not rub the wound. Do not do any activities that will make you sweat a lot until the skin glue has fallen off. Do not apply liquid, cream, or ointment medicine to your wound while the skin glue is still on. If a bandage is placed over the wound, do not put tape right on top of the skin glue. Do not pick at the glue. The skin glue usually stays on for 5-10 days. Then, it falls off the skin. Follow these instructions at home: Medicines Take over-the-counter and prescription medicines only as told by your doctor. If you were prescribed an antibiotic medicine, take or apply it as told by your doctor. Do not stop using it even if you start to feel better.  Managing pain and swelling If told, put ice on the injured area. To do this: Put ice in a plastic bag. Place a towel between your skin and the bag. Leave the ice on for 20 minutes, 2-3 times a day. Take off the ice if your skin turns bright red. This is very important. If you cannot feel pain, heat, or cold, you have a greater risk of damage to the area. Raise the injured area above the level of your heart while you are sitting or lying down. General instructions  Avoid any activity that could make your wound reopen. Check your wound every day for signs of infection. Check for: More redness, swelling, or pain. Fluid or blood. Warmth. Pus or a bad smell. Keep all follow-up visits. Contact a doctor if: You got a tetanus shot and you have  any of these problems where the needle went in: Swelling. Very bad pain. Redness. Bleeding. A wound that was closed breaks open. You have a fever. You have any of these signs of infection in your wound: More redness, swelling, or pain. Fluid or blood. Warmth. Pus or a bad smell. You see something coming out of the wound, such as wood or glass. Medicine does not make your pain go away. You notice a change in the color of your skin near your wound. You need to change the bandage often. You have a new rash. You lose feeling (have numbness) around the wound. Get help right away if: You have very bad swelling around the wound. Your pain suddenly gets worse and is very bad. You have painful lumps near the wound or on skin anywhere on your body. You have a red streak going away from your wound. The wound is on your hand or foot, and: You cannot move a finger or toe. Your fingers or toes look pale or bluish. Summary A laceration is a cut that may go through all layers of the skin. The cut may also go into the tissue right under the skin. Some cuts heal on their own. Others need to be closed with stitches, staples, skin adhesive strips, or skin glue. Follow your doctor's instructions for caring for your cut. Proper care of a cut lowers the risk of infection, helps the cut heal better, and may prevent scarring. This information is not intended to replace advice given to you by your health care provider. Make sure you discuss any questions you have with your health care provider. Document Revised: 12/19/2020 Document Reviewed: 12/19/2020 Elsevier Patient Education  [place].

## 2022-08-06 NOTE — Progress Notes (Signed)
New Patient Office Visit  Subjective    Patient ID: Adam Bennett, male    DOB: 10/15/1941  Age: 81 y.o. MRN: [id number]  CC:  Leg laceration  HPI Adam Bennett presents for laceration of left leg. Onset was two days ago. Pt states he has mild pain, redness, and swelling. Denies warmth, fever, or purulent drainage from wound. Treatment has included peroxide rinse, antibiotic ointment, and non-adherent dressing. States TDAP is up-to-date.   Outpatient Encounter Medications as of 08/06/2022  Medication Sig   acetaminophen (TYLENOL) 650 MG CR tablet Take 650 mg by mouth as needed for pain.   allopurinol (ZYLOPRIM) 100 MG tablet TAKE 1 TABLET BY MOUTH EVERY DAY   aspirin EC 81 MG tablet Take 81 mg by mouth daily.   diltiazem (CARDIZEM CD) 240 MG 24 hr capsule Take 1 capsule (240 mg total) by mouth daily.   lisinopril (ZESTRIL) 20 MG tablet Take 1 tablet (20 mg total) by mouth daily.   loratadine (CLARITIN) 10 MG tablet Take 10 mg by mouth as needed for allergies.   meloxicam (MOBIC) 7.5 MG tablet TAKE 1 TABLET(7.5 MG) BY MOUTH DAILY (Patient taking differently: Take 7.5 mg by mouth daily.)   metoprolol succinate (TOPROL-XL) 50 MG 24 hr tablet Take 1 tablet (50 mg total) by mouth daily.   Multiple Vitamin (MULTIVITAMIN WITH MINERALS) TABS tablet Take 1 tablet by mouth daily. Unknown strength   omeprazole (PRILOSEC) 20 MG capsule TAKE 1 CAPSULE(20 MG) BY MOUTH TWICE DAILY   polyethylene glycol (MIRALAX / GLYCOLAX) packet Take 17 g by mouth daily as needed for mild constipation or moderate constipation.   rosuvastatin (CRESTOR) 10 MG tablet Take 1 tablet (10 mg total) by mouth daily.   Sennosides-Docusate Sodium (STOOL SOFTENER/LAXATIVE PO) Take 1 tablet by mouth daily. Unknown strenght   No facility-administered encounter medications on file as of 08/06/2022.    Past Medical History:  Diagnosis Date   A-fib [facility])    Anal bleeding 02/03/2021   At increased risk of exposure to COVID-19 virus  10/21/2020   Atrial fibrillation ([place]) 06/25/2014   Bilateral carpal tunnel syndrome 02/22/2018   Bronchial pneumonia 10/21/2020   Cervical radiculopathy at C7 02/22/2018   Left   Chronic sinus complaints    Dilated cardiomyopathy ([place])    Essential hypertension, benign 06/25/2014   GERD (gastroesophageal reflux disease)    Hematochezia 02/05/2020   Hypertension    Hypotension, unspecified 06/25/2014   NSVT (nonsustained ventricular tachycardia) ([place])    Obstructive sleep apnea 05/08/2015   Pulmonary hypertension ([place]) 05/08/2015   Special screening examination for viral disease 02/05/2020   Spondylolisthesis of lumbar region 06/22/2014   Syncope 06/25/2014   Ulnar neuropathy at elbow 02/22/2018   Bilateral    Past Surgical History:  Procedure Laterality Date   BACK SURGERY     CARDIAC CATHETERIZATION     DONE IN [place], 55YRS AGO   CHOLECYSTECTOMY     FOOT SURGERY     HEMORRHOID SURGERY     HERNIA REPAIR     JOINT REPLACEMENT     BILATER KNEE REPLACEMENTS   TRANSURETHRAL RESECTION OF PROSTATE      Family History  Problem Relation Age of Onset   Congestive Heart Failure Father    CAD Father    Congestive Heart Failure Brother    CAD Brother    Hypertension Other    Diabetes Other    Colon cancer Mother     Social History  Socioeconomic History   Marital status: Married    Spouse name: Not on file   Number of children: Not on file   Years of education: Not on file   Highest education level: Not on file  Occupational History   Not on file  Tobacco Use   Smoking status: Never   Smokeless tobacco: Never  Vaping Use   Vaping Use: Never used  Substance and Sexual Activity   Alcohol use: Yes    Comment: occasional/rare   Drug use: No   Sexual activity: Not on file  Other Topics Concern   Not on file  Social History Narrative   Not on file   Social Determinants of Health   Financial Resource Strain: Not on file  Food Insecurity: Not on file  Transportation  Needs: Not on file  Physical Activity: Not on file  Stress: Not on file  Social Connections: Not on file  Intimate Partner Violence: Not on file    ROS See pertinent positives and negatives per HPI.    Objective    BP 134/80   Pulse 97   Temp (!) 97.1 F (36.2 C)   Ht '6\' 3"'$  (1.905 m)   Wt 209 lb (94.8 kg)   SpO2 98%   BMI 26.12 kg/m    Physical Exam Skin:    Findings: Wound present.          Comments: Skin tear to right anterior lower extremity, mild erythema noted; no warmth, drainage, or odor present       Assessment & Plan:    1. Skin tear of lower leg without complication, right, initial encounter - sulfamethoxazole-trimethoprim (BACTRIM DS) 800-160 MG tablet; Take 1 tablet by mouth 2 (two) times daily.  Dispense: 14 tablet; Refill: 0    Take Bactrim-DS twice daily for 7 days Elevate right leg Monitor wound for infection Follow-up as needed   Signed, Jerrell Belfast, DNP

## 2022-08-06 NOTE — Telephone Encounter (Signed)
Wife called and stated she spoke with you about her husband cutting his leg over the weekend and he wouldn't come in to be seen. Now it is swollen and she thinks he may need an antibiotic wanted to know could you see him this afternoon. Please advise

## 2022-08-06 NOTE — Telephone Encounter (Signed)
Appt made in office

## 2022-08-07 ENCOUNTER — Telehealth: Payer: Self-pay | Admitting: Cardiology

## 2022-08-07 LAB — BASIC METABOLIC PANEL
BUN/Creatinine Ratio: 16 (ref 10–24)
BUN: 13 mg/dL (ref 8–27)
CO2: 23 mmol/L (ref 20–29)
Calcium: 9.6 mg/dL (ref 8.6–10.2)
Chloride: 103 mmol/L (ref 96–106)
Creatinine, Ser: 0.79 mg/dL (ref 0.76–1.27)
Glucose: 96 mg/dL (ref 70–99)
Potassium: 4.4 mmol/L (ref 3.5–5.2)
Sodium: 140 mmol/L (ref 134–144)
eGFR: 90 mL/min/{1.73_m2} (ref >59)

## 2022-08-07 NOTE — Telephone Encounter (Signed)
Patient was returning call. Please advise ?

## 2022-08-10 NOTE — Telephone Encounter (Signed)
Left VM to call back 

## 2022-08-10 NOTE — Telephone Encounter (Signed)
Results reviewed with Adam Bennett per DPR as per Dr. Joya Gaskins note.  Adam Bennett verbalized understanding and had no additional questions. Routed to PCP

## 2022-08-10 NOTE — Telephone Encounter (Signed)
Pt's wife is returning call. Requesting call back.  

## 2022-08-31 ENCOUNTER — Ambulatory Visit: Payer: Medicare Other | Admitting: Physician Assistant

## 2022-09-08 DIAGNOSIS — K644 Residual hemorrhoidal skin tags: Secondary | ICD-10-CM | POA: Diagnosis not present

## 2022-09-22 ENCOUNTER — Telehealth: Payer: Self-pay | Admitting: Cardiology

## 2022-09-22 DIAGNOSIS — I10 Essential (primary) hypertension: Secondary | ICD-10-CM

## 2022-09-22 NOTE — Telephone Encounter (Signed)
Called the patient's wife Inez Catalina and she stated that during the patients last office visit in October his lisinopril was increased and the patient's blood pressure has not gone down. He is also feeling more weak and tired. The latest blood pressure readings are listed below:  150/84 HR 64 154/99  148/80 135/92 137/78   The patient's wife would like to know if there are any other options to help bring down the patients blood pressure.

## 2022-09-22 NOTE — Telephone Encounter (Signed)
Pt c/o BP issue: STAT if pt c/o blurred vision, one-sided weakness or slurred speech  1. What are your last 5 BP readings?  150/84 HR 64 154/99  148/80 135/92 137/78   2. Are you having any other symptoms (ex. Dizziness, headache, blurred vision, passed out)?  Pt states he feels more tired than normal.   3. What is your BP issue? Pt was taken up on his medication lisinopril (ZESTRIL) 20 MG tablet  in October to half a tab more. Pt wife states that he BP readings have not gone down with this change and requesting a call to find out other options for them.

## 2022-09-23 ENCOUNTER — Other Ambulatory Visit: Payer: Self-pay

## 2022-09-23 DIAGNOSIS — I10 Essential (primary) hypertension: Secondary | ICD-10-CM

## 2022-09-23 NOTE — Telephone Encounter (Signed)
Called patients wife and informed her of Dr. Wendy Poet recommendation below:  "Lets check Chem-7 if Chem-7 is fine we will probably add some diuretic"  Patients wife stated that they would come into the office tomorrow afternoon to have the lab draw done and she had no further questions at this time.

## 2022-09-24 DIAGNOSIS — I10 Essential (primary) hypertension: Secondary | ICD-10-CM | POA: Diagnosis not present

## 2022-09-25 LAB — BASIC METABOLIC PANEL
BUN/Creatinine Ratio: 13 (ref 10–24)
BUN: 11 mg/dL (ref 8–27)
CO2: 23 mmol/L (ref 20–29)
Calcium: 9.4 mg/dL (ref 8.6–10.2)
Chloride: 103 mmol/L (ref 96–106)
Creatinine, Ser: 0.86 mg/dL (ref 0.76–1.27)
Glucose: 87 mg/dL (ref 70–99)
Potassium: 4.3 mmol/L (ref 3.5–5.2)
Sodium: 141 mmol/L (ref 134–144)
eGFR: 88 mL/min/{1.73_m2} (ref >59)

## 2022-09-29 ENCOUNTER — Telehealth: Payer: Self-pay | Admitting: Cardiology

## 2022-09-29 DIAGNOSIS — K644 Residual hemorrhoidal skin tags: Secondary | ICD-10-CM | POA: Diagnosis not present

## 2022-09-29 MED ORDER — HYDROCHLOROTHIAZIDE 12.5 MG PO CAPS: 12.5000 mg | ORAL_CAPSULE | Freq: Every day | ORAL | 3 refills | Status: DC

## 2022-09-29 MED ORDER — LISINOPRIL 20 MG PO TABS: 30.0000 mg | ORAL_TABLET | Freq: Every day | ORAL | 2 refills | Status: DC

## 2022-09-29 NOTE — Telephone Encounter (Signed)
Spoke with spouse per DPR- per Dr. Wendy Poet note- Advised of Blood work and new medication- HCTZ 12.5 daily. Spouse agreed and verbalized understanding. She will keep BP log and come for BMP in 1 week

## 2022-09-29 NOTE — Telephone Encounter (Signed)
Pt's wife calling in regards to lab results. She would also like to discuss whether or not pt needs to continue taking Lisinopril due to him only having 1 tablet left. Please advise

## 2022-10-12 DIAGNOSIS — I10 Essential (primary) hypertension: Secondary | ICD-10-CM | POA: Diagnosis not present

## 2022-10-13 LAB — BASIC METABOLIC PANEL
BUN/Creatinine Ratio: 12 (ref 10–24)
BUN: 10 mg/dL (ref 8–27)
CO2: 23 mmol/L (ref 20–29)
Calcium: 9.3 mg/dL (ref 8.6–10.2)
Chloride: 101 mmol/L (ref 96–106)
Creatinine, Ser: 0.85 mg/dL (ref 0.76–1.27)
Glucose: 134 mg/dL — ABNORMAL HIGH (ref 70–99)
Potassium: 3.9 mmol/L (ref 3.5–5.2)
Sodium: 141 mmol/L (ref 134–144)
eGFR: 88 mL/min/{1.73_m2} (ref >59)

## 2022-10-15 ENCOUNTER — Telehealth: Payer: Self-pay | Admitting: Cardiology

## 2022-10-15 NOTE — Telephone Encounter (Signed)
Spoke with spouse about blood pressures and BP medications. She stated that he has been so tired since starting HCTZ. She will try taking him off the meds for 2-3 days and see if he improves. She will keep a close watch on his blood pressures and report next week.

## 2022-10-15 NOTE — Telephone Encounter (Signed)
LVM for spouse that as soon as Dr. Agustin Cree looks at pts labs we will call her with the results.

## 2022-10-15 NOTE — Telephone Encounter (Signed)
  Pt's wife calling to get lab results. She said to call her at [phone]

## 2022-10-24 DIAGNOSIS — G4733 Obstructive sleep apnea (adult) (pediatric): Secondary | ICD-10-CM | POA: Diagnosis not present

## 2022-11-04 ENCOUNTER — Other Ambulatory Visit: Payer: Self-pay | Admitting: Physician Assistant

## 2022-11-04 MED ORDER — AMOXICILLIN-POT CLAVULANATE 875-125 MG PO TABS: 1.0000 | ORAL_TABLET | Freq: Two times a day (BID) | ORAL | 0 refills | Status: DC

## 2022-11-12 ENCOUNTER — Encounter: Payer: Self-pay | Admitting: Physician Assistant

## 2022-11-12 ENCOUNTER — Ambulatory Visit (INDEPENDENT_AMBULATORY_CARE_PROVIDER_SITE_OTHER): Payer: Medicare Other | Admitting: Physician Assistant

## 2022-11-12 VITALS — BP 146/86 | HR 65 | Temp 97.2°F | Ht 75.0 in | Wt 217.6 lb

## 2022-11-12 DIAGNOSIS — J069 Acute upper respiratory infection, unspecified: Secondary | ICD-10-CM

## 2022-11-12 DIAGNOSIS — I10 Essential (primary) hypertension: Secondary | ICD-10-CM

## 2022-11-12 LAB — POCT INFLUENZA A/B
Influenza A, POC: NEGATIVE
Influenza B, POC: NEGATIVE

## 2022-11-12 LAB — POC COVID19 BINAXNOW: SARS Coronavirus 2 Ag: NEGATIVE

## 2022-11-12 MED ORDER — HYDROCODONE BIT-HOMATROP MBR 5-1.5 MG/5ML PO SOLN: 5.0000 mL | Freq: Four times a day (QID) | ORAL | 0 refills | Status: DC | PRN

## 2022-11-12 MED ORDER — LISINOPRIL 40 MG PO TABS: 40.0000 mg | ORAL_TABLET | Freq: Every day | ORAL | 1 refills | Status: DC

## 2022-11-12 MED ORDER — PREDNISONE 20 MG PO TABS: ORAL_TABLET | ORAL | 0 refills | Status: AC

## 2022-11-12 MED ORDER — AMOXICILLIN-POT CLAVULANATE 875-125 MG PO TABS: 1.0000 | ORAL_TABLET | Freq: Two times a day (BID) | ORAL | 0 refills | Status: DC

## 2022-11-12 MED ORDER — TRIAMCINOLONE ACETONIDE 40 MG/ML IJ SUSP
60.0000 mg | Freq: Once | INTRAMUSCULAR | Status: AC
  Administered 2022-11-12: 60 mg via INTRAMUSCULAR

## 2022-11-12 NOTE — Progress Notes (Signed)
Acute Office Visit  Subjective:    Patient ID: Adam Bennett, male    DOB: 10-27-1940, 82 y.o.   MRN: [id number]  Chief Complaint  Patient presents with   Cough   Wheezing    HPI: Patient is in today for complaints of cough, congestion and mild wheezing.  Has been treated with augmentin recently and symptoms have continued.  Denies fever, malaise.    Pt with history of hypertension.  Wife states she stopped hctz for pt because he seemed very tired all the time.  For several months bp has been averaging 140s/upper 80s Denies chest pain.  Currently on zestril '30mg'$  qd  Past Medical History:  Diagnosis Date   A-fib [facility])    Anal bleeding 02/03/2021   At increased risk of exposure to COVID-19 virus 10/21/2020   Atrial fibrillation ([place]) 06/25/2014   Bilateral carpal tunnel syndrome 02/22/2018   Bronchial pneumonia 10/21/2020   Cervical radiculopathy at C7 02/22/2018   Left   Chronic sinus complaints    Dilated cardiomyopathy ([place])    Essential hypertension, benign 06/25/2014   GERD (gastroesophageal reflux disease)    Hematochezia 02/05/2020   Hypertension    Hypotension, unspecified 06/25/2014   NSVT (nonsustained ventricular tachycardia) (HCC)    Obstructive sleep apnea 05/08/2015   Pulmonary hypertension ([place]) 05/08/2015   Special screening examination for viral disease 02/05/2020   Spondylolisthesis of lumbar region 06/22/2014   Syncope 06/25/2014   Ulnar neuropathy at elbow 02/22/2018   Bilateral    Past Surgical History:  Procedure Laterality Date   BACK SURGERY     CARDIAC CATHETERIZATION     DONE IN [place], 57YRS AGO   CHOLECYSTECTOMY     FOOT SURGERY     HEMORRHOID SURGERY     HERNIA REPAIR     JOINT REPLACEMENT     BILATER KNEE REPLACEMENTS   TRANSURETHRAL RESECTION OF PROSTATE      Family History  Problem Relation Age of Onset   Congestive Heart Failure Father    CAD Father    Congestive Heart Failure Brother    CAD Brother    Hypertension Other     Diabetes Other    Colon cancer Mother     Social History   Socioeconomic History   Marital status: Married    Spouse name: Not on file   Number of children: Not on file   Years of education: Not on file   Highest education level: Not on file  Occupational History   Not on file  Tobacco Use   Smoking status: Former    Packs/day: 1.50    Types: Cigarettes    Quit date: 09/20/2007    Years since quitting: 15.1   Smokeless tobacco: Never  Vaping Use   Vaping Use: Never used  Substance and Sexual Activity   Alcohol use: Yes    Comment: occasional/rare   Drug use: No   Sexual activity: Not on file  Other Topics Concern   Not on file  Social History Narrative   Not on file   Social Determinants of Health   Financial Resource Strain: Not on file  Food Insecurity: No Food Insecurity (08/06/2022)   Hunger Vital Sign    Worried About Running Out of Food in the Last Year: Never true    Ran Out of Food in the Last Year: Never true  Transportation Needs: Not on file  Physical Activity: Not on file  Stress: Not on file  Social Connections: Not  on file  Intimate Partner Violence: Not At Risk (08/06/2022)   Humiliation, Afraid, Rape, and Kick questionnaire    Fear of Current or Ex-Partner: No    Emotionally Abused: No    Physically Abused: No    Sexually Abused: No    Outpatient Medications Prior to Visit  Medication Sig Dispense Refill   acetaminophen (TYLENOL) 650 MG CR tablet Take 650 mg by mouth as needed for pain.     allopurinol (ZYLOPRIM) 100 MG tablet TAKE 1 TABLET BY MOUTH EVERY DAY 90 tablet 1   aspirin EC 81 MG tablet Take 81 mg by mouth daily.     diltiazem (CARDIZEM CD) 240 MG 24 hr capsule Take 1 capsule (240 mg total) by mouth daily. 90 capsule 3   loratadine (CLARITIN) 10 MG tablet Take 10 mg by mouth as needed for allergies.     meloxicam (MOBIC) 7.5 MG tablet TAKE 1 TABLET(7.5 MG) BY MOUTH DAILY (Patient taking differently: Take 7.5 mg by mouth daily.) 90  tablet 0   metoprolol succinate (TOPROL-XL) 50 MG 24 hr tablet Take 1 tablet (50 mg total) by mouth daily. 90 tablet 1   Multiple Vitamin (MULTIVITAMIN WITH MINERALS) TABS tablet Take 1 tablet by mouth daily. Unknown strength     omeprazole (PRILOSEC) 20 MG capsule TAKE 1 CAPSULE(20 MG) BY MOUTH TWICE DAILY 180 capsule 1   polyethylene glycol (MIRALAX / GLYCOLAX) packet Take 17 g by mouth daily as needed for mild constipation or moderate constipation.     Sennosides-Docusate Sodium (STOOL SOFTENER/LAXATIVE PO) Take 1 tablet by mouth daily. Unknown strenght     amoxicillin-clavulanate (AUGMENTIN) 875-125 MG tablet Take 1 tablet by mouth 2 (two) times daily. 20 tablet 0   lisinopril (ZESTRIL) 20 MG tablet Take 1.5 tablets (30 mg total) by mouth daily. 90 tablet 2   rosuvastatin (CRESTOR) 10 MG tablet Take 1 tablet (10 mg total) by mouth daily. 90 tablet 3   hydrochlorothiazide (MICROZIDE) 12.5 MG capsule Take 1 capsule (12.5 mg total) by mouth daily. 90 capsule 3   sulfamethoxazole-trimethoprim (BACTRIM DS) 800-160 MG tablet Take 1 tablet by mouth 2 (two) times daily. 14 tablet 0   No facility-administered medications prior to visit.    No Known Allergies  Review of Systems    CONSTITUTIONAL: Negative for chills, fatigue, fever,  E/N/T: see HPI CARDIOVASCULAR: Negative for chest pain, dizziness,  RESPIRATORY: see HPI GASTROINTESTINAL: Negative for abdominal pain, acid reflux symptoms, constipation, diarrhea, nausea and vomiting.       Objective:   PHYSICAL EXAM:   VS: BP (!) 146/86 (BP Location: Left Arm, Patient Position: Sitting, Cuff Size: Large)   Pulse 65   Temp (!) 97.2 F (36.2 C) (Temporal)   Ht '6\' 3"'$  (1.905 m)   Wt 217 lb 9.6 oz (98.7 kg)   SpO2 96%   BMI 27.20 kg/m   GEN: Well nourished, well developed, in no acute distress  HEENT: normal external ears and nose - normal external auditory canals and TMS -  - Lips, Teeth and Gums - normal  Oropharynx -  erythema/pnd Cardiac: RRR; no murmurs, rubs, Skin: warm and dry, no rash   Office Visit on 11/12/2022  Component Date Value Ref Range Status   SARS Coronavirus 2 Ag 11/12/2022 Negative  Negative Final   Influenza A, POC 11/12/2022 Negative  Negative Final   Influenza B, POC 11/12/2022 Negative  Negative Final    Health Maintenance Due  Topic Date Due   Hepatitis C Screening  Never done   Zoster Vaccines- Shingrix (1 of 2) Never done   Pneumonia Vaccine 55+ Years old (1 - PCV) Never done   Medicare Annual Wellness (AWV)  01/30/2020   COVID-19 Vaccine (4 - 2023-24 season) 06/26/2022    There are no preventive care reminders to display for this patient.   Lab Results  Component Value Date   TSH 0.932 05/26/2022   Lab Results  Component Value Date   WBC 6.0 05/26/2022   HGB 15.8 05/26/2022   HCT 45.9 05/26/2022   MCV 93 05/26/2022   PLT 168 05/26/2022   Lab Results  Component Value Date   NA 141 10/12/2022   K 3.9 10/12/2022   CO2 23 10/12/2022   GLUCOSE 134 (H) 10/12/2022   BUN 10 10/12/2022   CREATININE 0.85 10/12/2022   BILITOT 0.7 05/26/2022   ALKPHOS 81 05/26/2022   AST 20 05/26/2022   ALT 39 05/26/2022   PROT 6.3 05/26/2022   ALBUMIN 4.4 05/26/2022   CALCIUM 9.3 10/12/2022   ANIONGAP 13 06/27/2014   EGFR 88 10/12/2022   Lab Results  Component Value Date   CHOL 140 05/26/2022   Lab Results  Component Value Date   HDL 53 05/26/2022   Lab Results  Component Value Date   LDLCALC 67 05/26/2022   Lab Results  Component Value Date   TRIG 112 05/26/2022   Lab Results  Component Value Date   CHOLHDL 2.6 05/26/2022   Lab Results  Component Value Date   HGBA1C 6.1 (H) 06/27/2014       Assessment & Plan:  Adam Bennett was seen today for cough and wheezing.  Acute upper respiratory infection -     POC COVID-19 BinaxNow -     POCT Influenza A/B -     HYDROcodone Bit-Homatrop MBr; Take 5 mLs by mouth every 6 (six) hours as needed.  Dispense: 120 mL;  Refill: 0 -     predniSONE; Take 3 tablets (60 mg total) by mouth daily with breakfast for 3 days, THEN 2 tablets (40 mg total) daily with breakfast for 3 days, THEN 1 tablet (20 mg total) daily with breakfast for 3 days.  Dispense: 18 tablet; Refill: 0 -     Amoxicillin-Pot Clavulanate; Take 1 tablet by mouth 2 (two) times daily.  Dispense: 10 tablet; Refill: 0 -     Triamcinolone Acetonide  Primary hypertension -     Lisinopril; Take 1 tablet (40 mg total) by mouth daily.  Dispense: 90 tablet; Refill: 1    Meds ordered this encounter  Medications   lisinopril (ZESTRIL) 40 MG tablet    Sig: Take 1 tablet (40 mg total) by mouth daily.    Dispense:  90 tablet    Refill:  1    Order Specific Question:   Supervising Provider    Answer:   Nowland, Lynder Parents   HYDROcodone bit-homatropine (HYDROMET) 5-1.5 MG/5ML syrup    Sig: Take 5 mLs by mouth every 6 (six) hours as needed.    Dispense:  120 mL    Refill:  0    Order Specific Question:   Supervising Provider    Answer:   USTIN, CRUICKSHANK   predniSONE (DELTASONE) 20 MG tablet    Sig: Take 3 tablets (60 mg total) by mouth daily with breakfast for 3 days, THEN 2 tablets (40 mg total) daily with breakfast for 3 days, THEN 1 tablet (20 mg total) daily with breakfast for 3 days.  Dispense:  18 tablet    Refill:  0    Order Specific Question:   Supervising Provider    Answer:   TONNIE, FRIEDEL   amoxicillin-clavulanate (AUGMENTIN) 875-125 MG tablet    Sig: Take 1 tablet by mouth 2 (two) times daily.    Dispense:  10 tablet    Refill:  0    Order Specific Question:   Supervising Provider    Answer:   Shelton Silvas   triamcinolone acetonide (KENALOG-40) injection 60 mg    Orders Placed This Encounter  Procedures   POC COVID-19 BinaxNow   POCT Influenza A/B     Follow-up: Return if symptoms worsen or fail to improve. And for next chronic visit  An After Visit Summary was printed and given to the  patient.  Yetta Flock [facility] [phone]

## 2022-11-27 ENCOUNTER — Encounter: Payer: Self-pay | Admitting: Cardiology

## 2022-11-27 ENCOUNTER — Ambulatory Visit: Payer: Medicare Other | Attending: Cardiology | Admitting: Cardiology

## 2022-11-27 VITALS — BP 110/62 | HR 68 | Ht 75.0 in | Wt 213.6 lb

## 2022-11-27 DIAGNOSIS — I420 Dilated cardiomyopathy: Secondary | ICD-10-CM

## 2022-11-27 DIAGNOSIS — I4729 Other ventricular tachycardia: Secondary | ICD-10-CM

## 2022-11-27 DIAGNOSIS — E782 Mixed hyperlipidemia: Secondary | ICD-10-CM

## 2022-11-27 DIAGNOSIS — I10 Essential (primary) hypertension: Secondary | ICD-10-CM

## 2022-11-27 DIAGNOSIS — R251 Tremor, unspecified: Secondary | ICD-10-CM | POA: Diagnosis not present

## 2022-11-27 NOTE — Patient Instructions (Signed)

## 2022-11-27 NOTE — Progress Notes (Addendum)
Cardiology Office Note:    Date:  11/27/2022   ID:  Adam Bennett, DOB [birth year], MRN [id number]  PCP:  Marge Duncans, PA-C  Cardiologist:  Jenne Campus, MD    Referring MD: Marge Duncans, PA-C   Chief Complaint  Patient presents with   BP monitoring     History of Present Illness:    Adam Bennett is a 82 y.o. male with past medical history significant for cardiomyopathy with last estimation of ejection fraction done in September of last year lower limits of normal, obstructive sleep apnea, COPD, pulmonary hypertension which is only mild, 1 documented episode of atrial fibrillation but he does not want to be anticoagulated.  He does have difficulty with blood pressure which is difficult to control. Comes to my office for follow-up.  Overall doing very well.  Denies of any chest pain tightness squeezing pressure mid chest no palpitation dizziness swelling of lower extremities.  He is concerned about his blood pressure obviously.  Interestingly all blood pressure measurements from home show me elevated blood pressure but blood pressure checked in the office was perfectly normal actually be on the lower side.  Previously he was on small dose of hydrochlorothiazide but could not tolerate it.  I am started questioning accuracy of his blood pressure monitoring.  Past Medical History:  Diagnosis Date   A-fib [facility])    Anal bleeding 02/03/2021   At increased risk of exposure to COVID-19 virus 10/21/2020   Atrial fibrillation ([place]) 06/25/2014   Bilateral carpal tunnel syndrome 02/22/2018   Bronchial pneumonia 10/21/2020   Cervical radiculopathy at C7 02/22/2018   Left   Chronic sinus complaints    Dilated cardiomyopathy ([place])    Essential hypertension, benign 06/25/2014   GERD (gastroesophageal reflux disease)    Hematochezia 02/05/2020   Hypertension    Hypotension, unspecified 06/25/2014   NSVT (nonsustained ventricular tachycardia) ([place])    Obstructive sleep apnea 05/08/2015   Pulmonary  hypertension ([place]) 05/08/2015   Special screening examination for viral disease 02/05/2020   Spondylolisthesis of lumbar region 06/22/2014   Syncope 06/25/2014   Ulnar neuropathy at elbow 02/22/2018   Bilateral    Past Surgical History:  Procedure Laterality Date   BACK SURGERY     CARDIAC CATHETERIZATION     DONE IN [place], 14YRS AGO   CHOLECYSTECTOMY     FOOT SURGERY     HEMORRHOID SURGERY     HERNIA REPAIR     JOINT REPLACEMENT     BILATER KNEE REPLACEMENTS   TRANSURETHRAL RESECTION OF PROSTATE      Current Medications: Current Meds  Medication Sig   acetaminophen (TYLENOL) 650 MG CR tablet Take 650 mg by mouth as needed for pain.   allopurinol (ZYLOPRIM) 100 MG tablet TAKE 1 TABLET BY MOUTH EVERY DAY   aspirin EC 81 MG tablet Take 81 mg by mouth daily.   diltiazem (CARDIZEM CD) 240 MG 24 hr capsule Take 1 capsule (240 mg total) by mouth daily.   lisinopril (ZESTRIL) 40 MG tablet Take 1 tablet (40 mg total) by mouth daily.   loratadine (CLARITIN) 10 MG tablet Take 10 mg by mouth as needed for allergies.   meloxicam (MOBIC) 7.5 MG tablet TAKE 1 TABLET(7.5 MG) BY MOUTH DAILY (Patient taking differently: Take 7.5 mg by mouth daily.)   metoprolol succinate (TOPROL-XL) 50 MG 24 hr tablet Take 1 tablet (50 mg total) by mouth daily.   Multiple Vitamin (MULTIVITAMIN WITH MINERALS) TABS tablet Take 1 tablet by  mouth daily. Unknown strength   omeprazole (PRILOSEC) 20 MG capsule TAKE 1 CAPSULE(20 MG) BY MOUTH TWICE DAILY (Patient taking differently: Take 20 mg by mouth 2 (two) times daily before a meal.)   polyethylene glycol (MIRALAX / GLYCOLAX) packet Take 17 g by mouth daily as needed for mild constipation or moderate constipation.   rosuvastatin (CRESTOR) 10 MG tablet Take 1 tablet (10 mg total) by mouth daily.   [DISCONTINUED] amoxicillin-clavulanate (AUGMENTIN) 875-125 MG tablet Take 1 tablet by mouth 2 (two) times daily.   [DISCONTINUED] HYDROcodone bit-homatropine (HYDROMET) 5-1.5  MG/5ML syrup Take 5 mLs by mouth every 6 (six) hours as needed. (Patient taking differently: Take 5 mLs by mouth every 6 (six) hours as needed for cough.)   [DISCONTINUED] Sennosides-Docusate Sodium (STOOL SOFTENER/LAXATIVE PO) Take 1 tablet by mouth daily. Unknown strenght     Allergies:   Patient has no known allergies.   Social History   Socioeconomic History   Marital status: Married    Spouse name: Not on file   Number of children: Not on file   Years of education: Not on file   Highest education level: Not on file  Occupational History   Not on file  Tobacco Use   Smoking status: Former    Packs/day: 1.50    Types: Cigarettes    Quit date: 09/20/2007    Years since quitting: 15.1   Smokeless tobacco: Never  Vaping Use   Vaping Use: Never used  Substance and Sexual Activity   Alcohol use: Yes    Comment: occasional/rare   Drug use: No   Sexual activity: Not on file  Other Topics Concern   Not on file  Social History Narrative   Not on file   Social Determinants of Health   Financial Resource Strain: Not on file  Food Insecurity: No Food Insecurity (08/06/2022)   Hunger Vital Sign    Worried About Running Out of Food in the Last Year: Never true    Ran Out of Food in the Last Year: Never true  Transportation Needs: Not on file  Physical Activity: Not on file  Stress: Not on file  Social Connections: Not on file     Family History: The patient's family history includes CAD in his brother and father; Colon cancer in his mother; Congestive Heart Failure in his brother and father; Diabetes in an other family member; Hypertension in an other family member. ROS:   Please see the history of present illness.    All 14 point review of systems negative except as described per history of present illness  EKGs/Labs/Other Studies Reviewed:      Recent Labs: 05/26/2022: ALT 39; Hemoglobin 15.8; Platelets 168; TSH 0.932 10/12/2022: BUN 10; Creatinine, Ser 0.85;  Potassium 3.9; Sodium 141  Recent Lipid Panel    Component Value Date/Time   CHOL 140 05/26/2022 1059   TRIG 112 05/26/2022 1059   HDL 53 05/26/2022 1059   CHOLHDL 2.6 05/26/2022 1059   LDLCALC 67 05/26/2022 1059    Physical Exam:    VS:  BP 110/62 (BP Location: Left Arm, Patient Position: Sitting)   Pulse 68   Ht '6\' 3"'$  (1.905 m)   Wt 213 lb 9.6 oz (96.9 kg)   SpO2 92%   BMI 26.70 kg/m     Wt Readings from Last 3 Encounters:  11/27/22 213 lb 9.6 oz (96.9 kg)  11/12/22 217 lb 9.6 oz (98.7 kg)  08/06/22 209 lb (94.8 kg)     GEN:  Well nourished, well developed in no acute distress HEENT: Normal NECK: No JVD; No carotid bruits LYMPHATICS: No lymphadenopathy CARDIAC: RRR, no murmurs, no rubs, no gallops RESPIRATORY:  Clear to auscultation without rales, wheezing or rhonchi  ABDOMEN: Soft, non-tender, non-distended MUSCULOSKELETAL:  No edema; No deformity  SKIN: Warm and dry LOWER EXTREMITIES: no swelling NEUROLOGIC:  Alert and oriented x 3 PSYCHIATRIC:  Normal affect   ASSESSMENT:    1. Dilated cardiomyopathy ([place])   2. Essential hypertension, benign   3. NSVT (nonsustained ventricular tachycardia) ([place])   4. Tremor   5. Mixed hyperlipidemia    PLAN:    In order of problems listed above:  Dilated cardiomyopathy last echocardiogram showed low normal ejection fraction.  He is on lisinopril, Toprol-XL.  Will continue that management since ejection fraction seems to holding nicely. Essential hypertension question about accuracy of his blood pressure monitor.  Ask him to bring blood pressure monitor to my office next week if blood pressure is accurate then we of course adjust medication if there is a big discrepancy between monitor and blood pressure check in office will schedule him to have 24 hours blood pressure monitor. History of nonsustained ventricular tachycardia denies having dizziness or passing out.  He is on beta-blocker I continue. Tremor he did see  neurologist he was given some medication for it by neurologist however he passed out when he was taking this medication and stopped it and now doing very well. Mixed dyslipidemia I did review his K PN from summertime show LDL 67 HDL 53 we will continue present management  J Prevatt CNA was present during entire visit today in the room with [place]  Medication Adjustments/Labs and Tests Ordered: Current medicines are reviewed at length with the patient today.  Concerns regarding medicines are outlined above.  No orders of the defined types were placed in this encounter.  Medication changes: No orders of the defined types were placed in this encounter.   Signed, Park Liter, MD, [facility] 11/27/2022 1:43 PM    [place]

## 2022-11-30 ENCOUNTER — Ambulatory Visit: Payer: Medicare Other | Attending: Cardiology

## 2022-11-30 VITALS — BP 130/62 | HR 71 | Resp 18 | Ht 75.0 in | Wt 213.8 lb

## 2022-11-30 DIAGNOSIS — I10 Essential (primary) hypertension: Secondary | ICD-10-CM

## 2022-11-30 NOTE — Progress Notes (Signed)
   Nurse Visit   Date of Encounter: 11/30/2022 ID: Adam Bennett, DOB 1941/06/30, MRN [id number]  PCP:  Marge Duncans, [place] [facility] Cardiologist:  Jenne Campus, MD {    Visit Details   VS:  There were no vitals taken for this visit. , BMI There is no height or weight on file to calculate BMI.  Wt Readings from Last 3 Encounters:  11/27/22 213 lb 9.6 oz (96.9 kg)  11/12/22 217 lb 9.6 oz (98.7 kg)  08/06/22 209 lb (94.8 kg)     Reason for visit: BP check compared to personal BP cuff 122/81 Performed today: Vitals, Provider consulted:Krasowski, and Education Changes (medications, testing, etc.) : none Length of Visit: 15 minutes    Medications Adjustments/Labs and Tests Ordered: No orders of the defined types were placed in this encounter.  No orders of the defined types were placed in this encounter.    Signed, Truddie Hidden, RN  11/30/2022 1:33 PM

## 2022-12-10 ENCOUNTER — Other Ambulatory Visit: Payer: Self-pay | Admitting: Cardiology

## 2022-12-10 ENCOUNTER — Other Ambulatory Visit: Payer: Self-pay | Admitting: Physician Assistant

## 2022-12-10 DIAGNOSIS — K219 Gastro-esophageal reflux disease without esophagitis: Secondary | ICD-10-CM

## 2022-12-28 ENCOUNTER — Ambulatory Visit: Payer: Medicare Other | Admitting: Cardiology

## 2022-12-28 ENCOUNTER — Other Ambulatory Visit: Payer: Self-pay

## 2022-12-28 DIAGNOSIS — M1A00X0 Idiopathic chronic gout, unspecified site, without tophus (tophi): Secondary | ICD-10-CM

## 2022-12-31 ENCOUNTER — Telehealth: Payer: Self-pay

## 2022-12-31 NOTE — Telephone Encounter (Signed)
Called patient to schedule Medicare Annual Wellness Visit (AWV). Left message for patient to call back and schedule Medicare Annual Wellness Visit (AWV).  Last date of AWV: eligible 10/26/09 for AWV-I  Please schedule an appointment at any time with Shelle Iron, LPN.   Norton Blizzard, [place] (AAMA)  [place] [facility] [phone]

## 2023-01-20 ENCOUNTER — Ambulatory Visit (INDEPENDENT_AMBULATORY_CARE_PROVIDER_SITE_OTHER): Payer: Medicare Other | Admitting: Physician Assistant

## 2023-01-20 ENCOUNTER — Encounter: Payer: Self-pay | Admitting: Physician Assistant

## 2023-01-20 VITALS — BP 138/70 | HR 86 | Temp 97.7°F | Resp 14 | Ht 75.0 in | Wt 218.0 lb

## 2023-01-20 DIAGNOSIS — J069 Acute upper respiratory infection, unspecified: Secondary | ICD-10-CM | POA: Diagnosis not present

## 2023-01-20 DIAGNOSIS — I10 Essential (primary) hypertension: Secondary | ICD-10-CM

## 2023-01-20 MED ORDER — AMOXICILLIN 875 MG PO TABS: 875.0000 mg | ORAL_TABLET | Freq: Two times a day (BID) | ORAL | 0 refills | Status: AC

## 2023-01-20 MED ORDER — FLUTICASONE PROPIONATE 50 MCG/ACT NA SUSP: 2.0000 | Freq: Every day | NASAL | 6 refills | Status: DC

## 2023-01-20 NOTE — Progress Notes (Signed)
Established Patient Office Visit  Subjective:  Patient ID: Adam Bennett, male    DOB: 05/05/1941  Age: 82 y.o. MRN: [id number]  CC:  Chief Complaint  Patient presents with   Hypertension    HPI Tremond Prada Baylock presents for concerns about bp elevation.  Pt has noted over the past 2 weeks he has had some slightly elevated bp readings at home ranging 140-160/80-90.  Denies chest pain, dyspnea or edema.  He has been taking decongestants and has had sinus pressure, pnd, and nasal congestion with some sinus headaches as well Bp today in our office normal - his bp cuff reading higher  Past Medical History:  Diagnosis Date   A-fib ([place])    Anal bleeding 02/03/2021   At increased risk of exposure to COVID-19 virus 10/21/2020   Atrial fibrillation ([place]) 06/25/2014   Bilateral carpal tunnel syndrome 02/22/2018   Bronchial pneumonia 10/21/2020   Cervical radiculopathy at C7 02/22/2018   Left   Chronic sinus complaints    Dilated cardiomyopathy ([place])    Essential hypertension, benign 06/25/2014   GERD (gastroesophageal reflux disease)    Hematochezia 02/05/2020   Hypertension    Hypotension, unspecified 06/25/2014   NSVT (nonsustained ventricular tachycardia) ([place])    Obstructive sleep apnea 05/08/2015   Pulmonary hypertension ([place]) 05/08/2015   Special screening examination for viral disease 02/05/2020   Spondylolisthesis of lumbar region 06/22/2014   Syncope 06/25/2014   Ulnar neuropathy at elbow 02/22/2018   Bilateral    Past Surgical History:  Procedure Laterality Date   BACK SURGERY     CARDIAC CATHETERIZATION     DONE IN [place], 64YRS AGO   CHOLECYSTECTOMY     FOOT SURGERY     HEMORRHOID SURGERY     HERNIA REPAIR     JOINT REPLACEMENT     BILATER KNEE REPLACEMENTS   TRANSURETHRAL RESECTION OF PROSTATE      Family History  Problem Relation Age of Onset   Congestive Heart Failure Father    CAD Father    Congestive Heart Failure Brother    CAD Brother    Hypertension Other     Diabetes Other    Colon cancer Mother     Social History   Socioeconomic History   Marital status: Married    Spouse name: Not on file   Number of children: Not on file   Years of education: Not on file   Highest education level: Not on file  Occupational History   Not on file  Tobacco Use   Smoking status: Former    Packs/day: 1.5    Types: Cigarettes    Quit date: 09/20/2007    Years since quitting: 15.3   Smokeless tobacco: Never  Vaping Use   Vaping Use: Never used  Substance and Sexual Activity   Alcohol use: Yes    Comment: occasional/rare   Drug use: No   Sexual activity: Not Currently  Other Topics Concern   Not on file  Social History Narrative   Not on file   Social Determinants of Health   Financial Resource Strain: Not on file  Food Insecurity: No Food Insecurity (08/06/2022)   Hunger Vital Sign    Worried About Running Out of Food in the Last Year: Never true    Ran Out of Food in the Last Year: Never true  Transportation Needs: Not on file  Physical Activity: Not on file  Stress: Not on file  Social Connections: Not on file  Intimate Partner Violence: Not At Risk (08/06/2022)   Humiliation, Afraid, Rape, and Kick questionnaire    Fear of Current or Ex-Partner: No    Emotionally Abused: No    Physically Abused: No    Sexually Abused: No     Current Outpatient Medications:    acetaminophen (TYLENOL) 650 MG CR tablet, Take 650 mg by mouth as needed for pain., Disp: , Rfl:    allopurinol (ZYLOPRIM) 100 MG tablet, TAKE 1 TABLET BY MOUTH EVERY DAY, Disp: 90 tablet, Rfl: 1   amoxicillin (AMOXIL) 875 MG tablet, Take 1 tablet (875 mg total) by mouth 2 (two) times daily for 10 days., Disp: 20 tablet, Rfl: 0   aspirin EC 81 MG tablet, Take 81 mg by mouth daily., Disp: , Rfl:    diltiazem (CARDIZEM CD) 240 MG 24 hr capsule, Take 1 capsule (240 mg total) by mouth daily., Disp: 90 capsule, Rfl: 3   fluticasone (FLONASE) 50 MCG/ACT nasal spray, Place 2 sprays  into both nostrils daily., Disp: 16 g, Rfl: 6   indomethacin (INDOCIN) 50 MG capsule, Take 50 mg by mouth 2 (two) times daily with a meal., Disp: , Rfl:    lisinopril (ZESTRIL) 40 MG tablet, Take 1 tablet (40 mg total) by mouth daily., Disp: 90 tablet, Rfl: 1   loratadine (CLARITIN) 10 MG tablet, Take 10 mg by mouth as needed for allergies., Disp: , Rfl:    metoprolol succinate (TOPROL-XL) 50 MG 24 hr tablet, Take 1 tablet (50 mg total) by mouth daily., Disp: 90 tablet, Rfl: 3   Multiple Vitamin (MULTIVITAMIN WITH MINERALS) TABS tablet, Take 1 tablet by mouth daily. Unknown strength, Disp: , Rfl:    omeprazole (PRILOSEC) 20 MG capsule, TAKE 1 CAPSULE(20 MG) BY MOUTH TWICE DAILY, Disp: 180 capsule, Rfl: 1   polyethylene glycol (MIRALAX / GLYCOLAX) packet, Take 17 g by mouth daily as needed for mild constipation or moderate constipation., Disp: , Rfl:    rosuvastatin (CRESTOR) 10 MG tablet, Take 1 tablet (10 mg total) by mouth daily., Disp: 90 tablet, Rfl: 3   No Known Allergies  ROS CONSTITUTIONAL: Negative for chills, fatigue, fever,  E/N/T: see HPI CARDIOVASCULAR: Negative for chest pain, RESPIRATORY: see HPI GASTROINTESTINAL: Negative for abdominal pain, acid reflux symptoms, constipation, diarrhea, nausea and vomiting.      Objective:    PHYSICAL EXAM:   VS: BP 138/70 Comment: Blood pressure machine  Pulse 86   Temp 97.7 F (36.5 C)   Resp 14   Ht 6\' 3"  (1.905 m)   Wt 218 lb (98.9 kg)   SpO2 97%   BMI 27.25 kg/m   GEN: Well nourished, well developed, in no acute distress  HEENT: normal external ears and nose - normal external auditory canals and TMS -  - Lips, Teeth and Gums - normal  Oropharynx - erythema/pnd Cardiac: RRR; no murmurs Respiratory:  normal respiratory rate and pattern with no distress - normal breath sounds with no rales, rhonchi, wheezes or rubs      Health Maintenance Due  Topic Date Due   Hepatitis C Screening  Never done   Zoster Vaccines-  Shingrix (1 of 2) Never done   Pneumonia Vaccine 54+ Years old (1 of 1 - PCV) Never done   Medicare Annual Wellness (AWV)  01/30/2020   COVID-19 Vaccine (4 - 2023-24 season) 06/26/2022    There are no preventive care reminders to display for this patient.  Lab Results  Component Value Date   TSH 0.932  05/26/2022   Lab Results  Component Value Date   WBC 6.0 05/26/2022   HGB 15.8 05/26/2022   HCT 45.9 05/26/2022   MCV 93 05/26/2022   PLT 168 05/26/2022   Lab Results  Component Value Date   NA 141 10/12/2022   K 3.9 10/12/2022   CO2 23 10/12/2022   GLUCOSE 134 (H) 10/12/2022   BUN 10 10/12/2022   CREATININE 0.85 10/12/2022   BILITOT 0.7 05/26/2022   ALKPHOS 81 05/26/2022   AST 20 05/26/2022   ALT 39 05/26/2022   PROT 6.3 05/26/2022   ALBUMIN 4.4 05/26/2022   CALCIUM 9.3 10/12/2022   ANIONGAP 13 06/27/2014   EGFR 88 10/12/2022   Lab Results  Component Value Date   CHOL 140 05/26/2022   Lab Results  Component Value Date   HDL 53 05/26/2022   Lab Results  Component Value Date   LDLCALC 67 05/26/2022   Lab Results  Component Value Date   TRIG 112 05/26/2022   Lab Results  Component Value Date   CHOLHDL 2.6 05/26/2022   Lab Results  Component Value Date   HGBA1C 6.1 (H) 06/27/2014      Assessment & Plan:   Problem List Items Addressed This Visit       Cardiovascular and Mediastinum   Hypertension Continue current meds at this time and continue to monitor     Respiratory   Acute upper respiratory infection - Primary   Relevant Medications   amoxicillin (AMOXIL) 875 MG tablet   fluticasone (FLONASE) 50 MCG/ACT nasal spray    Meds ordered this encounter  Medications   amoxicillin (AMOXIL) 875 MG tablet    Sig: Take 1 tablet (875 mg total) by mouth 2 (two) times daily for 10 days.    Dispense:  20 tablet    Refill:  0    Order Specific Question:   Supervising Provider    Answer:   Van[place]he, Lynder Parents   fluticasone (FLONASE) 50 MCG/ACT  nasal spray    Sig: Place 2 sprays into both nostrils daily.    Dispense:  16 g    Refill:  6    Order Specific Question:   Supervising Provider    Answer:   [name] [id number]    Follow-up: Return in about 4 weeks (around 02/17/2023) for fasting follow up.    SARA R Xavion Muscat, PA-C

## 2023-01-22 DIAGNOSIS — G4733 Obstructive sleep apnea (adult) (pediatric): Secondary | ICD-10-CM | POA: Diagnosis not present

## 2023-02-01 ENCOUNTER — Other Ambulatory Visit: Payer: Self-pay | Admitting: Physician Assistant

## 2023-02-01 DIAGNOSIS — M1A00X0 Idiopathic chronic gout, unspecified site, without tophus (tophi): Secondary | ICD-10-CM

## 2023-03-04 ENCOUNTER — Encounter: Payer: Self-pay | Admitting: Physician Assistant

## 2023-03-04 ENCOUNTER — Ambulatory Visit (INDEPENDENT_AMBULATORY_CARE_PROVIDER_SITE_OTHER): Payer: Medicare Other | Admitting: Physician Assistant

## 2023-03-04 VITALS — BP 144/84 | HR 67 | Temp 97.2°F | Ht 75.0 in | Wt 216.0 lb

## 2023-03-04 DIAGNOSIS — R7303 Prediabetes: Secondary | ICD-10-CM | POA: Diagnosis not present

## 2023-03-04 DIAGNOSIS — I4811 Longstanding persistent atrial fibrillation: Secondary | ICD-10-CM

## 2023-03-04 DIAGNOSIS — M1A00X0 Idiopathic chronic gout, unspecified site, without tophus (tophi): Secondary | ICD-10-CM

## 2023-03-04 DIAGNOSIS — E782 Mixed hyperlipidemia: Secondary | ICD-10-CM | POA: Diagnosis not present

## 2023-03-04 DIAGNOSIS — N400 Enlarged prostate without lower urinary tract symptoms: Secondary | ICD-10-CM

## 2023-03-04 DIAGNOSIS — I10 Essential (primary) hypertension: Secondary | ICD-10-CM | POA: Diagnosis not present

## 2023-03-04 DIAGNOSIS — K219 Gastro-esophageal reflux disease without esophagitis: Secondary | ICD-10-CM | POA: Diagnosis not present

## 2023-03-04 MED ORDER — DILTIAZEM HCL ER COATED BEADS 360 MG PO CP24
360.0000 mg | ORAL_CAPSULE | Freq: Every day | ORAL | 3 refills | Status: DC
Start: 2023-03-04 — End: 2023-07-05

## 2023-03-04 NOTE — Progress Notes (Signed)
Subjective:  Patient ID: Adam Bennett, male    DOB: 09/24/1941  Age: 82 y.o. MRN: [id number]  Chief Complaint  Patient presents with   Medical Management of Chronic Issues    HPI  Pt presents for follow up of hypertension. The patient is tolerating the medication well without side effects. Compliance with treatment has been good; including taking medication as directed , maintains a healthy diet and regular exercise regimen , and following up as directed.-- states he has had some fatigue and mild headaches.  BP has been elevated at home ranging 140-160s/80s-90s Denies chest pain or dyspnea Currently on cardizem CD 240, Toprol XL 50 and zestril 40mg  qd  Pt with history of gout - stable on daily allopurinol - uses indocin prn  Pt with history of GERD - symptoms controlled with prilosec 20mg  bid  Mixed hyperlipidemia  Pt presents with hyperlipidemia. Compliance with treatment has been good - The patient is compliant with medications, maintains a low cholesterol diet , follows up as directed , and maintains an exercise regimen . The patient denies experiencing any hypercholesterolemia related symptoms. Pt currently on crestor 10mg  qd    Current Outpatient Medications on File Prior to Visit  Medication Sig Dispense Refill   acetaminophen (TYLENOL) 650 MG CR tablet Take 650 mg by mouth as needed for pain.     allopurinol (ZYLOPRIM) 100 MG tablet TAKE 1 TABLET BY MOUTH EVERY DAY 90 tablet 1   aspirin EC 81 MG tablet Take 81 mg by mouth daily.     diltiazem (CARDIZEM CD) 240 MG 24 hr capsule Take 1 capsule (240 mg total) by mouth daily. 90 capsule 3   fluticasone (FLONASE) 50 MCG/ACT nasal spray Place 2 sprays into both nostrils daily. 16 g 6   indomethacin (INDOCIN) 50 MG capsule Take 50 mg by mouth 2 (two) times daily with a meal.     lisinopril (ZESTRIL) 40 MG tablet Take 1 tablet (40 mg total) by mouth daily. 90 tablet 1   loratadine (CLARITIN) 10 MG tablet Take 10 mg by mouth as needed  for allergies.     metoprolol succinate (TOPROL-XL) 50 MG 24 hr tablet Take 1 tablet (50 mg total) by mouth daily. 90 tablet 3   Multiple Vitamin (MULTIVITAMIN WITH MINERALS) TABS tablet Take 1 tablet by mouth daily. Unknown strength     omeprazole (PRILOSEC) 20 MG capsule TAKE 1 CAPSULE(20 MG) BY MOUTH TWICE DAILY 180 capsule 1   polyethylene glycol (MIRALAX / GLYCOLAX) packet Take 17 g by mouth daily as needed for mild constipation or moderate constipation.     rosuvastatin (CRESTOR) 10 MG tablet Take 1 tablet (10 mg total) by mouth daily. 90 tablet 3   No current facility-administered medications on file prior to visit.   Past Medical History:  Diagnosis Date   A-fib [facility])    Anal bleeding 02/03/2021   At increased risk of exposure to COVID-19 virus 10/21/2020   Atrial fibrillation ([facility]) 06/25/2014   Bilateral carpal tunnel syndrome 02/22/2018   Bronchial pneumonia 10/21/2020   Cervical radiculopathy at C7 02/22/2018   Left   Chronic sinus complaints    Dilated cardiomyopathy ([facility])    Essential hypertension, benign 06/25/2014   GERD (gastroesophageal reflux disease)    Hematochezia 02/05/2020   Hypertension    Hypotension, unspecified 06/25/2014   NSVT (nonsustained ventricular tachycardia) ([facility])    Obstructive sleep apnea 05/08/2015   Pulmonary hypertension ([facility]) 05/08/2015   Special screening examination for viral disease 02/05/2020  Spondylolisthesis of lumbar region 06/22/2014   Syncope 06/25/2014   Ulnar neuropathy at elbow 02/22/2018   Bilateral   Past Surgical History:  Procedure Laterality Date   BACK SURGERY     CARDIAC CATHETERIZATION     DONE IN [place], 74YRS AGO   CHOLECYSTECTOMY     FOOT SURGERY     HEMORRHOID SURGERY     HERNIA REPAIR     JOINT REPLACEMENT     BILATER KNEE REPLACEMENTS   TRANSURETHRAL RESECTION OF PROSTATE      Family History  Problem Relation Age of Onset   Congestive Heart Failure Father    CAD Father    Congestive Heart Failure Brother     CAD Brother    Hypertension Other    Diabetes Other    Colon cancer Mother    Social History   Socioeconomic History   Marital status: Married    Spouse name: Not on file   Number of children: Not on file   Years of education: Not on file   Highest education level: Not on file  Occupational History   Not on file  Tobacco Use   Smoking status: Former    Packs/day: 1.5    Types: Cigarettes    Quit date: 09/20/2007    Years since quitting: 15.4   Smokeless tobacco: Never  Vaping Use   Vaping Use: Never used  Substance and Sexual Activity   Alcohol use: Yes    Comment: occasional/rare   Drug use: No   Sexual activity: Not Currently  Other Topics Concern   Not on file  Social History Narrative   Not on file   Social Determinants of Health   Financial Resource Strain: Not on file  Food Insecurity: No Food Insecurity (08/06/2022)   Hunger Vital Sign    Worried About Running Out of Food in the Last Year: Never true    Ran Out of Food in the Last Year: Never true  Transportation Needs: Not on file  Physical Activity: Not on file  Stress: Not on file  Social Connections: Not on file    CONSTITUTIONAL: see HPI E/N/T: Negative for ear pain, nasal congestion and sore throat.  CARDIOVASCULAR: Negative for chest pain, dizziness, palpitations and pedal edema.  RESPIRATORY: Negative for recent cough and dyspnea.  GASTROINTESTINAL: Negative for abdominal pain, acid reflux symptoms, constipation, diarrhea, nausea and vomiting.  MSK: Negative for arthralgias and myalgias.  INTEGUMENTARY: Negative for rash.  NEUROLOGICAL: Negative for dizziness and headaches.  PSYCHIATRIC: Negative for sleep disturbance and to question depression screen.  Negative for depression, negative for anhedonia.       Objective:  PHYSICAL EXAM:   VS: BP (!) 144/84   Pulse 67   Temp (!) 97.2 F (36.2 C) (Temporal)   Ht 6\' 3"  (1.905 m)   Wt 216 lb (98 kg)   SpO2 96%   BMI 27.00 kg/m   GEN:  Well nourished, well developed, in no acute distress  Cardiac: RRR; no murmurs, rubs, or gallops,no edema -  Respiratory:  normal respiratory rate and pattern with no distress - normal breath sounds with no rales, rhonchi, wheezes or rubs MS: no deformity or atrophy  Skin: warm and dry, no rash  Psych: euthymic mood, appropriate affect and demeanor    Lab Results  Component Value Date   WBC 6.0 05/26/2022   HGB 15.8 05/26/2022   HCT 45.9 05/26/2022   PLT 168 05/26/2022   GLUCOSE 134 (H) 10/12/2022  CHOL 140 05/26/2022   TRIG 112 05/26/2022   HDL 53 05/26/2022   LDLCALC 67 05/26/2022   ALT 39 05/26/2022   AST 20 05/26/2022   NA 141 10/12/2022   K 3.9 10/12/2022   CL 101 10/12/2022   CREATININE 0.85 10/12/2022   BUN 10 10/12/2022   CO2 23 10/12/2022   TSH 0.932 05/26/2022   INR 0.93 07/02/2010   HGBA1C 6.1 (H) 06/27/2014      Assessment & Plan:   Problem List Items Addressed This Visit       Cardiovascular and Mediastinum   Essential hypertension, benign - Primary   Relevant Orders   CBC with Differential/Platelet   Comprehensive metabolic panel   TSH   Lipid panel Increase cardizem to 360mg  qd     Digestive   Gastroesophageal reflux disease without esophagitis Continue current meds     Other   Idiopathic chronic gout without tophus   Relevant Orders   Uric Acid Continue meds   Mixed hyperlipidemia   Relevant Orders   Lipid panel Watch diet  Prediabetes Watch diet Hgb A1c pending  Prostate screen PSA  .  No orders of the defined types were placed in this encounter.   Orders Placed This Encounter  Procedures   CBC with Differential/Platelet   Comprehensive metabolic panel   TSH   Lipid panel   Uric acid   PSA   Hemoglobin A1c     Follow-up: Return in about 4 weeks (around 04/01/2023) for follow-up.  An After Visit Summary was printed and given to the patient.  Jettie Pagan [facility] [phone]

## 2023-03-05 LAB — CBC WITH DIFFERENTIAL/PLATELET
Basophils Absolute: 0 10*3/uL (ref 0.0–0.2)
Basos: 1 %
EOS (ABSOLUTE): 0.2 10*3/uL (ref 0.0–0.4)
Eos: 4 %
Hematocrit: 46.3 % (ref 37.5–51.0)
Hemoglobin: 15.6 g/dL (ref 13.0–17.7)
Immature Grans (Abs): 0.1 10*3/uL (ref 0.0–0.1)
Immature Granulocytes: 1 %
Lymphocytes Absolute: 1 10*3/uL (ref 0.7–3.1)
Lymphs: 16 %
MCH: 31.5 pg (ref 26.6–33.0)
MCHC: 33.7 g/dL (ref 31.5–35.7)
MCV: 93 fL (ref 79–97)
Monocytes Absolute: 0.5 10*3/uL (ref 0.1–0.9)
Monocytes: 8 %
Neutrophils Absolute: 4.3 10*3/uL (ref 1.4–7.0)
Neutrophils: 70 %
Platelets: 235 10*3/uL (ref 150–450)
RBC: 4.96 x10E6/uL (ref 4.14–5.80)
RDW: 12.8 % (ref 11.6–15.4)
WBC: 6.2 10*3/uL (ref 3.4–10.8)

## 2023-03-05 LAB — COMPREHENSIVE METABOLIC PANEL
ALT: 25 IU/L (ref 0–44)
AST: 22 IU/L (ref 0–40)
Albumin/Globulin Ratio: 2.4 — ABNORMAL HIGH (ref 1.2–2.2)
Albumin: 4.3 g/dL (ref 3.8–4.8)
Alkaline Phosphatase: 91 IU/L (ref 44–121)
BUN/Creatinine Ratio: 13 (ref 10–24)
BUN: 11 mg/dL (ref 8–27)
Bilirubin Total: 0.5 mg/dL (ref 0.0–1.2)
CO2: 21 mmol/L (ref 20–29)
Calcium: 9.3 mg/dL (ref 8.6–10.2)
Chloride: 103 mmol/L (ref 96–106)
Creatinine, Ser: 0.86 mg/dL (ref 0.76–1.27)
Globulin, Total: 1.8 g/dL (ref 1.5–4.5)
Glucose: 109 mg/dL — ABNORMAL HIGH (ref 70–99)
Potassium: 4.7 mmol/L (ref 3.5–5.2)
Sodium: 142 mmol/L (ref 134–144)
Total Protein: 6.1 g/dL (ref 6.0–8.5)
eGFR: 88 mL/min/{1.73_m2} (ref >59)

## 2023-03-05 LAB — LIPID PANEL
Chol/HDL Ratio: 3.8 ratio (ref 0.0–5.0)
Cholesterol, Total: 170 mg/dL (ref 100–199)
HDL: 45 mg/dL (ref >39)
LDL Chol Calc (NIH): 96 mg/dL (ref 0–99)
Triglycerides: 169 mg/dL — ABNORMAL HIGH (ref 0–149)
VLDL Cholesterol Cal: 29 mg/dL (ref 5–40)

## 2023-03-05 LAB — URIC ACID: Uric Acid: 4.8 mg/dL (ref 3.8–8.4)

## 2023-03-05 LAB — TSH: TSH: 1.12 u[IU]/mL (ref 0.450–4.500)

## 2023-03-05 LAB — HEMOGLOBIN A1C
Est. average glucose Bld gHb Est-mCnc: 131 mg/dL
Hgb A1c MFr Bld: 6.2 % — ABNORMAL HIGH (ref 4.8–5.6)

## 2023-03-05 LAB — CARDIOVASCULAR RISK ASSESSMENT

## 2023-03-14 DIAGNOSIS — H6592 Unspecified nonsuppurative otitis media, left ear: Secondary | ICD-10-CM | POA: Diagnosis not present

## 2023-04-01 ENCOUNTER — Ambulatory Visit: Payer: Medicare Other | Admitting: Physician Assistant

## 2023-04-21 ENCOUNTER — Encounter: Payer: Self-pay | Admitting: Physician Assistant

## 2023-04-21 ENCOUNTER — Ambulatory Visit (INDEPENDENT_AMBULATORY_CARE_PROVIDER_SITE_OTHER): Payer: Medicare Other | Admitting: Physician Assistant

## 2023-04-21 VITALS — BP 132/72 | HR 74 | Temp 97.3°F | Ht 75.0 in | Wt 214.0 lb

## 2023-04-21 DIAGNOSIS — I10 Essential (primary) hypertension: Secondary | ICD-10-CM

## 2023-04-21 NOTE — Progress Notes (Signed)
Subjective:  Patient ID: Adam Bennett, male    DOB: 1941/09/17  Age: 82 y.o. MRN: [id number]  Chief Complaint  Patient presents with   Medical Management of Chronic Issues    HPI Pt in today for follow up of hypertension.  At last visit a few weeks ago his cardizem was increased to 360mg .  He is also taking zestril 40mg  every day and toprol XL 50mg   Denies chest pain/dyspnea or edema His readings at home have been between 130-150/80-90s Did not bring his cuff today     04/21/2023    1:24 PM 08/06/2022    2:30 PM 03/02/2022    9:54 AM 08/05/2021    9:00 AM  Depression screen PHQ 2/9  Decreased Interest 0 0 0 0  Down, Depressed, Hopeless 0 0 0 0  PHQ - 2 Score 0 0 0 0  Altered sleeping  0    Tired, decreased energy  0    Change in appetite  0    Feeling bad or failure about yourself   0    Trouble concentrating  0    Moving slowly or fidgety/restless  0    Suicidal thoughts  0    PHQ-9 Score  0    Difficult doing work/chores  Not difficult at all          03/02/2022    9:53 AM 08/06/2022    2:30 PM 03/04/2023    9:45 AM 04/21/2023    1:24 PM  Fall Risk  Falls in the past year? 1 1 0 1  Was there an injury with Fall? 0 0 0 0  Fall Risk Category Calculator 2 2 0 1  Fall Risk Category (Retired) Moderate Moderate    (RETIRED) Patient Fall Risk Level Moderate fall risk Moderate fall risk    Patient at Risk for Falls Due to  Impaired balance/gait No Fall Risks History of fall(s);No Fall Risks  Fall risk Follow up  Falls evaluation completed;Falls prevention discussed Falls evaluation completed Follow up appointment     ROS CONSTITUTIONAL: Negative for chills, fatigue, fever,   CARDIOVASCULAR: Negative for chest pain, dizziness, palpitations and pedal edema.  RESPIRATORY: Negative for recent cough and dyspnea.  GASTROINTESTINAL: Negative for abdominal pain, acid reflux symptoms, constipation, diarrhea, nausea and vomiting.     Current Outpatient Medications:     acetaminophen (TYLENOL) 650 MG CR tablet, Take 650 mg by mouth as needed for pain., Disp: , Rfl:    allopurinol (ZYLOPRIM) 100 MG tablet, TAKE 1 TABLET BY MOUTH EVERY DAY, Disp: 90 tablet, Rfl: 1   aspirin EC 81 MG tablet, Take 81 mg by mouth daily., Disp: , Rfl:    diltiazem (CARDIZEM CD) 360 MG 24 hr capsule, Take 1 capsule (360 mg total) by mouth daily., Disp: 30 capsule, Rfl: 3   fluticasone (FLONASE) 50 MCG/ACT nasal spray, Place 2 sprays into both nostrils daily., Disp: 16 g, Rfl: 6   indomethacin (INDOCIN) 50 MG capsule, Take 50 mg by mouth 2 (two) times daily with a meal., Disp: , Rfl:    lisinopril (ZESTRIL) 40 MG tablet, Take 1 tablet (40 mg total) by mouth daily., Disp: 90 tablet, Rfl: 1   loratadine (CLARITIN) 10 MG tablet, Take 10 mg by mouth as needed for allergies., Disp: , Rfl:    metoprolol succinate (TOPROL-XL) 50 MG 24 hr tablet, Take 1 tablet (50 mg total) by mouth daily., Disp: 90 tablet, Rfl: 3   Multiple Vitamin (MULTIVITAMIN WITH MINERALS) TABS  tablet, Take 1 tablet by mouth daily. Unknown strength, Disp: , Rfl:    omeprazole (PRILOSEC) 20 MG capsule, TAKE 1 CAPSULE(20 MG) BY MOUTH TWICE DAILY, Disp: 180 capsule, Rfl: 1   polyethylene glycol (MIRALAX / GLYCOLAX) packet, Take 17 g by mouth daily as needed for mild constipation or moderate constipation., Disp: , Rfl:    rosuvastatin (CRESTOR) 10 MG tablet, Take 1 tablet (10 mg total) by mouth daily., Disp: 90 tablet, Rfl: 3  Past Medical History:  Diagnosis Date   A-fib ([facility])    Anal bleeding 02/03/2021   At increased risk of exposure to COVID-19 virus 10/21/2020   Atrial fibrillation ([facility]) 06/25/2014   Bilateral carpal tunnel syndrome 02/22/2018   Bronchial pneumonia 10/21/2020   Cervical radiculopathy at C7 02/22/2018   Left   Chronic sinus complaints    Dilated cardiomyopathy ([facility])    Essential hypertension, benign 06/25/2014   GERD (gastroesophageal reflux disease)    Hematochezia 02/05/2020   Hypertension     Hypotension, unspecified 06/25/2014   NSVT (nonsustained ventricular tachycardia) ([facility])    Obstructive sleep apnea 05/08/2015   Pulmonary hypertension ([facility]) 05/08/2015   Special screening examination for viral disease 02/05/2020   Spondylolisthesis of lumbar region 06/22/2014   Syncope 06/25/2014   Ulnar neuropathy at elbow 02/22/2018   Bilateral   Objective:  PHYSICAL EXAM:   BP 132/72   Pulse 74   Temp (!) 97.3 F (36.3 C) (Temporal)   Ht 6\' 3"  (1.905 m)   Wt 214 lb (97.1 kg)   SpO2 97%   BMI 26.75 kg/m    GEN: Well nourished, well developed, in no acute distress  Cardiac: RRR; no murmurs, rubs, or gallops,no edema -  Respiratory:  normal respiratory rate and pattern with no distress - normal breath sounds with no rales, rhonchi, wheezes or rubs Skin: warm and dry, no rash  Psych: euthymic mood, appropriate affect and demeanor  Assessment & Plan:    Primary hypertension Continue current meds and watch diet Continue to monitor bp - bring cuff next visit Will recheck bp in 2-3 weeks nurse visit    Follow-up: Return in about 5 months (around 09/21/2023) for chronic fasting follow-up.  An After Visit Summary was printed and given to the patient.  Jettie Pagan [facility] [phone]

## 2023-05-11 ENCOUNTER — Other Ambulatory Visit: Payer: Self-pay | Admitting: Physician Assistant

## 2023-05-11 DIAGNOSIS — I10 Essential (primary) hypertension: Secondary | ICD-10-CM

## 2023-05-18 ENCOUNTER — Encounter: Payer: Self-pay | Admitting: Physician Assistant

## 2023-05-18 ENCOUNTER — Ambulatory Visit (INDEPENDENT_AMBULATORY_CARE_PROVIDER_SITE_OTHER): Payer: Medicare Other | Admitting: Physician Assistant

## 2023-05-18 VITALS — BP 138/80 | HR 76 | Temp 97.5°F | Ht 77.0 in | Wt 215.2 lb

## 2023-05-18 DIAGNOSIS — N3001 Acute cystitis with hematuria: Secondary | ICD-10-CM

## 2023-05-18 LAB — POCT URINALYSIS DIP (CLINITEK)
Bilirubin, UA: NEGATIVE
Glucose, UA: NEGATIVE mg/dL
Ketones, POC UA: NEGATIVE mg/dL
Nitrite, UA: NEGATIVE
POC PROTEIN,UA: NEGATIVE
Spec Grav, UA: 1.015 (ref 1.010–1.025)
Urobilinogen, UA: 0.2 E.U./dL
pH, UA: 6 (ref 5.0–8.0)

## 2023-05-18 MED ORDER — CIPROFLOXACIN HCL 500 MG PO TABS
500.0000 mg | ORAL_TABLET | Freq: Two times a day (BID) | ORAL | 0 refills | Status: AC
Start: 2023-05-18 — End: 2023-05-28

## 2023-05-18 MED ORDER — PANTOPRAZOLE SODIUM 40 MG PO TBEC: 40.0000 mg | DELAYED_RELEASE_TABLET | Freq: Every day | ORAL | 3 refills | Status: DC

## 2023-05-18 NOTE — Progress Notes (Signed)
Acute Office Visit  Subjective:    Patient ID: Adam Bennett, male    DOB: [birth year], 82 y.o.   MRN: [id number]  Chief Complaint  Patient presents with   Dysuria    HPI: Patient is in today for complaints of urine frequency and dysuria for over the past week. Denies hematuria.  Denies abdominal pain, nausea or vomiting Denies fever or back pain   Current Outpatient Medications:    acetaminophen (TYLENOL) 650 MG CR tablet, Take 650 mg by mouth as needed for pain., Disp: , Rfl:    allopurinol (ZYLOPRIM) 100 MG tablet, TAKE 1 TABLET BY MOUTH EVERY DAY, Disp: 90 tablet, Rfl: 1   aspirin EC 81 MG tablet, Take 81 mg by mouth daily., Disp: , Rfl:    ciprofloxacin (CIPRO) 500 MG tablet, Take 1 tablet (500 mg total) by mouth 2 (two) times daily for 10 days., Disp: 20 tablet, Rfl: 0   diltiazem (CARDIZEM CD) 360 MG 24 hr capsule, Take 1 capsule (360 mg total) by mouth daily., Disp: 30 capsule, Rfl: 3   fluticasone (FLONASE) 50 MCG/ACT nasal spray, Place 2 sprays into both nostrils daily., Disp: 16 g, Rfl: 6   indomethacin (INDOCIN) 50 MG capsule, Take 50 mg by mouth 2 (two) times daily with a meal., Disp: , Rfl:    lisinopril (ZESTRIL) 40 MG tablet, TAKE 1 TABLET(40 MG) BY MOUTH DAILY, Disp: 90 tablet, Rfl: 1   loratadine (CLARITIN) 10 MG tablet, Take 10 mg by mouth as needed for allergies., Disp: , Rfl:    metoprolol succinate (TOPROL-XL) 50 MG 24 hr tablet, Take 1 tablet (50 mg total) by mouth daily., Disp: 90 tablet, Rfl: 3   Multiple Vitamin (MULTIVITAMIN WITH MINERALS) TABS tablet, Take 1 tablet by mouth daily. Unknown strength, Disp: , Rfl:    pantoprazole (PROTONIX) 40 MG tablet, Take 1 tablet (40 mg total) by mouth daily., Disp: 30 tablet, Rfl: 3   polyethylene glycol (MIRALAX / GLYCOLAX) packet, Take 17 g by mouth daily as needed for mild constipation or moderate constipation., Disp: , Rfl:    rosuvastatin (CRESTOR) 10 MG tablet, Take 1 tablet (10 mg total) by mouth daily., Disp: 90  tablet, Rfl: 3  No Known Allergies  ROS CONSTITUTIONAL: Negative for chills, fatigue, fever,   CARDIOVASCULAR: Negative for chest pain, dizziness, palpitations and pedal edema.  RESPIRATORY: Negative for recent cough and dyspnea.  GASTROINTESTINAL: Negative for abdominal pain, acid reflux symptoms, constipation, diarrhea, nausea and vomiting.  GU - see HPI     Objective:    PHYSICAL EXAM:   BP 138/80 (BP Location: Left Arm, Patient Position: Sitting, Cuff Size: Large)   Pulse 76   Temp (!) 97.5 F (36.4 C) (Temporal)   Ht 6\' 5"  (1.956 m)   Wt 215 lb 3.2 oz (97.6 kg)   SpO2 97%   BMI 25.52 kg/m    GEN: Well nourished, well developed, in no acute distress  Cardiac: RRR; no murmurs, rubs, or gallops,no edema - Respiratory:  normal respiratory rate and pattern with no distress - normal breath sounds with no rales, rhonchi, wheezes or rubs GI: normal bowel sounds, no masses or tenderness  Office Visit on 05/18/2023  Component Date Value Ref Range Status   Color, UA 05/18/2023 yellow  yellow Final   Clarity, UA 05/18/2023 cloudy (A)  clear Final   Glucose, UA 05/18/2023 negative  negative mg/dL Final   Bilirubin, UA 32/44/0102 negative  negative Final   Ketones, POC UA 05/18/2023  negative  negative mg/dL Final   Spec Grav, UA 78/29/5621 1.015  1.010 - 1.025 Final   Blood, UA 05/18/2023 trace-intact (A)  negative Final   pH, UA 05/18/2023 6.0  5.0 - 8.0 Final   POC PROTEIN,UA 05/18/2023 negative  negative, trace Final   Urobilinogen, UA 05/18/2023 0.2  0.2 or 1.0 E.U./dL Final   Nitrite, UA 30/86/5784 Negative  Negative Final   Leukocytes, UA 05/18/2023 Large (3+) (A)  Negative Final       Assessment & Plan:    Acute hemorrhagic cystitis -     POCT URINALYSIS DIP (CLINITEK) -     Urine Culture -     Ciprofloxacin HCl; Take 1 tablet (500 mg total) by mouth 2 (two) times daily for 10 days.  Dispense: 20 tablet; Refill: 0  Other orders -     Pantoprazole Sodium; Take 1  tablet (40 mg total) by mouth daily.  Dispense: 30 tablet; Refill: 3     Follow-up: Return in about 2 weeks (around 06/01/2023) for nurse visit re-check UA.  An After Visit Summary was printed and given to the patient.  [facility] [phone]

## 2023-05-27 ENCOUNTER — Encounter: Payer: Self-pay | Admitting: Cardiology

## 2023-05-27 ENCOUNTER — Ambulatory Visit: Payer: Medicare Other | Attending: Cardiology | Admitting: Cardiology

## 2023-05-27 VITALS — BP 138/80 | HR 75 | Ht 76.0 in | Wt 218.4 lb

## 2023-05-27 DIAGNOSIS — I480 Paroxysmal atrial fibrillation: Secondary | ICD-10-CM | POA: Diagnosis not present

## 2023-05-27 DIAGNOSIS — I420 Dilated cardiomyopathy: Secondary | ICD-10-CM

## 2023-05-27 DIAGNOSIS — I10 Essential (primary) hypertension: Secondary | ICD-10-CM | POA: Diagnosis not present

## 2023-05-27 DIAGNOSIS — I4729 Other ventricular tachycardia: Secondary | ICD-10-CM | POA: Diagnosis not present

## 2023-05-27 NOTE — Progress Notes (Signed)
Cardiology Office Note:    Date:  05/27/2023   ID:  Adam Bennett, DOB 1941/10/26, MRN [id number]  PCP:  Marianne Sofia, PA-C  Cardiologist:  Gypsy Balsam, MD    Referring MD: Marianne Sofia, PA-C   Chief Complaint  Patient presents with   Follow-up    History of Present Illness:    Adam Bennett is a 82 y.o. male with past medical history significant for essential hypertension, 1 documented episode of atrial fibrillation long time ago, does not want to take anticoagulation repeated test showing no evidence of atrial fibrillation, COPD, obstructive sleep apnea.  Comes today to my office for follow-up.  Still have difficulty controlling his blood pressure however tried to distract his wife was recognized to have some mass in the liver biopsy has been done then she had some embolic treatment for it called that ended up being in ICU for a week or so likely recover to this.  Apparently did some metastatic disease and I am not sure exactly if you know what the source of it is obviously is very stressed out about it and he His blood pressure being elevated because of this which probably is true.  Still holding quite well.  Denies have any chest pain tightness squeezing pressure burning chest no palpitation or shortness of breath.  Past Medical History:  Diagnosis Date   A-fib [facility])    Anal bleeding 02/03/2021   At increased risk of exposure to COVID-19 virus 10/21/2020   Atrial fibrillation (HCC) 06/25/2014   Bilateral carpal tunnel syndrome 02/22/2018   Bronchial pneumonia 10/21/2020   Cervical radiculopathy at C7 02/22/2018   Left   Chronic sinus complaints    Dilated cardiomyopathy (HCC)    Essential hypertension, benign 06/25/2014   GERD (gastroesophageal reflux disease)    Hematochezia 02/05/2020   Hypertension    Hypotension, unspecified 06/25/2014   NSVT (nonsustained ventricular tachycardia) (HCC)    Obstructive sleep apnea 05/08/2015   Pulmonary hypertension (HCC) 05/08/2015   Special  screening examination for viral disease 02/05/2020   Spondylolisthesis of lumbar region 06/22/2014   Syncope 06/25/2014   Ulnar neuropathy at elbow 02/22/2018   Bilateral    Past Surgical History:  Procedure Laterality Date   BACK SURGERY     CARDIAC CATHETERIZATION     DONE IN [place], 66YRS AGO   CHOLECYSTECTOMY     FOOT SURGERY     HEMORRHOID SURGERY     HERNIA REPAIR     JOINT REPLACEMENT     BILATER KNEE REPLACEMENTS   TRANSURETHRAL RESECTION OF PROSTATE      Current Medications: Current Meds  Medication Sig   acetaminophen (TYLENOL) 650 MG CR tablet Take 650 mg by mouth as needed for pain.   allopurinol (ZYLOPRIM) 100 MG tablet TAKE 1 TABLET BY MOUTH EVERY DAY   aspirin EC 81 MG tablet Take 81 mg by mouth daily.   ciprofloxacin (CIPRO) 500 MG tablet Take 1 tablet (500 mg total) by mouth 2 (two) times daily for 10 days.   diltiazem (CARDIZEM CD) 360 MG 24 hr capsule Take 1 capsule (360 mg total) by mouth daily.   fluticasone (FLONASE) 50 MCG/ACT nasal spray Place 2 sprays into both nostrils daily.   indomethacin (INDOCIN) 50 MG capsule Take 50 mg by mouth 2 (two) times daily with a meal.   lisinopril (ZESTRIL) 40 MG tablet TAKE 1 TABLET(40 MG) BY MOUTH DAILY (Patient taking differently: Take 40 mg by mouth daily.)   loratadine (CLARITIN)  10 MG tablet Take 10 mg by mouth as needed for allergies.   metoprolol succinate (TOPROL-XL) 50 MG 24 hr tablet Take 1 tablet (50 mg total) by mouth daily.   Multiple Vitamin (MULTIVITAMIN WITH MINERALS) TABS tablet Take 1 tablet by mouth daily. Unknown strength   pantoprazole (PROTONIX) 40 MG tablet Take 1 tablet (40 mg total) by mouth daily.   polyethylene glycol (MIRALAX / GLYCOLAX) packet Take 17 g by mouth daily as needed for mild constipation or moderate constipation.     Allergies:   Patient has no known allergies.   Social History   Socioeconomic History   Marital status: Married    Spouse name: Not on file   Number of  children: Not on file   Years of education: Not on file   Highest education level: Not on file  Occupational History   Not on file  Tobacco Use   Smoking status: Former    Current packs/day: 0.00    Types: Cigarettes    Quit date: 09/20/2007    Years since quitting: 15.6   Smokeless tobacco: Never  Vaping Use   Vaping status: Never Used  Substance and Sexual Activity   Alcohol use: Yes    Comment: occasional/rare   Drug use: No   Sexual activity: Not Currently  Other Topics Concern   Not on file  Social History Narrative   Not on file   Social Determinants of Health   Financial Resource Strain: Low Risk  (04/21/2023)   Overall Financial Resource Strain (CARDIA)    Difficulty of Paying Living Expenses: Not hard at all  Food Insecurity: No Food Insecurity (04/21/2023)   Hunger Vital Sign    Worried About Running Out of Food in the Last Year: Never true    Ran Out of Food in the Last Year: Never true  Transportation Needs: No Transportation Needs (04/21/2023)   PRAPARE - Administrator, Civil Service (Medical): No    Lack of Transportation (Non-Medical): No  Physical Activity: Sufficiently Active (04/21/2023)   Exercise Vital Sign    Days of Exercise per Week: 5 days    Minutes of Exercise per Session: 40 min  Stress: No Stress Concern Present (04/21/2023)   [employer] of Occupational Health - Occupational Stress Questionnaire    Feeling of Stress : Not at all  Social Connections: Moderately Integrated (04/21/2023)   Social Connection and Isolation Panel [NHANES]    Frequency of Communication with Friends and Family: More than three times a week    Frequency of Social Gatherings with Friends and Family: More than three times a week    Attends Religious Services: More than 4 times per year    Active Member of [employer] or Organizations: No    Attends Engineer, structural: Never    Marital Status: Married     Family History: The patient's family  history includes CAD in his brother and father; Colon cancer in his mother; Congestive Heart Failure in his brother and father; Diabetes in an other family member; Hypertension in an other family member. ROS:   Please see the history of present illness.    All 14 point review of systems negative except as described per history of present illness  EKGs/Labs/Other Studies Reviewed:    EKG Interpretation Date/Time:  Thursday May 27 2023 10:11:15 EDT Ventricular Rate:  75 PR Interval:  166 QRS Duration:  114 QT Interval:  388 QTC Calculation: 433 R Axis:   -46  Text Interpretation: Normal sinus rhythm Left anterior fascicular block Left ventricular hypertrophy Nonspecific ST and T wave abnormality Abnormal ECG When compared with ECG of 19-Jun-2022 09:26, Premature supraventricular complexes are no longer Present Nonspecific T wave abnormality now evident in Anterior leads Confirmed by Gypsy Balsam [phone]) on 05/27/2023 10:34:59 AM    Recent Labs: 03/04/2023: ALT 25; BUN 11; Creatinine, Ser 0.86; Hemoglobin 15.6; Platelets 235; Potassium 4.7; Sodium 142; TSH 1.120  Recent Lipid Panel    Component Value Date/Time   CHOL 170 03/04/2023 1012   TRIG 169 (H) 03/04/2023 1012   HDL 45 03/04/2023 1012   CHOLHDL 3.8 03/04/2023 1012   LDLCALC 96 03/04/2023 1012    Physical Exam:    VS:  BP 138/80 (BP Location: Left Arm, Patient Position: Sitting)   Pulse 75   Ht 6\' 4"  (1.93 m)   Wt 218 lb 7.2 oz (99.1 kg)   SpO2 90%   BMI 26.59 kg/m     Wt Readings from Last 3 Encounters:  05/27/23 218 lb 7.2 oz (99.1 kg)  05/18/23 215 lb 3.2 oz (97.6 kg)  04/21/23 214 lb (97.1 kg)     GEN:  Well nourished, well developed in no acute distress HEENT: Normal NECK: No JVD; No carotid bruits LYMPHATICS: No lymphadenopathy CARDIAC: RRR, no murmurs, no rubs, no gallops RESPIRATORY:  Clear to auscultation without rales, wheezing or rhonchi  ABDOMEN: Soft, non-tender, non-distended MUSCULOSKELETAL:   No edema; No deformity  SKIN: Warm and dry LOWER EXTREMITIES: no swelling NEUROLOGIC:  Alert and oriented x 3 PSYCHIATRIC:  Normal affect   ASSESSMENT:    1. Essential hypertension, benign   2. Dilated cardiomyopathy (HCC)   3. NSVT (nonsustained ventricular tachycardia) (HCC)   4. Paroxysmal atrial fibrillation (HCC)    PLAN:    In order of problems listed above:  Essential hypertension still not perfectly controlled but now she is truly going to very stressful.  Of time he does not want to switch any of his medication which is fine with me will continue monitoring. History of dilated cardiomyopathy will schedule him to have another echocardiogram done.  In the meantime continue with medication which include ACE inhibitor as well as beta-blocker if there is deterioration of ejection fraction we will switch to [facility]. Nonsustained ventricular tachycardia denies having any symptoms no dizziness no passing out. Dyslipidemia he is taking Crestor 10.  I did review K PN which show me data from May with LDL 5096 HDL 45 continue present management   Medication Adjustments/Labs and Tests Ordered: Current medicines are reviewed at length with the patient today.  Concerns regarding medicines are outlined above.  Orders Placed This Encounter  Procedures   EKG 12-Lead   Medication changes: No orders of the defined types were placed in this encounter.   Signed, Georgeanna Lea, MD, [facility] 05/27/2023 10:47 AM     [facility]

## 2023-05-27 NOTE — Patient Instructions (Signed)
Medication Instructions:  Your physician recommends that you continue on your current medications as directed. Please refer to the Current Medication list given to you today.  *If you need a refill on your cardiac medications before your next appointment, please call your pharmacy*   Lab Work: None If you have labs (blood work) drawn today and your tests are completely normal, you will receive your results only by: MyChart Message (if you have MyChart) OR A paper copy in the mail If you have any lab test that is abnormal or we need to change your treatment, we will call you to review the results.   Testing/Procedures: Your physician has requested that you have an echocardiogram. Echocardiography is a painless test that uses sound waves to create images of your heart. It provides your doctor with information about the size and shape of your heart and how well your heart's chambers and valves are working. This procedure takes approximately one hour. There are no restrictions for this procedure.    Follow-Up: At [facility], you and your health needs are our priority.  As part of our continuing mission to provide you with exceptional heart care, we have created designated Provider Care Teams.  These Care Teams include your primary Cardiologist (physician) and Advanced Practice Providers (APPs -  Physician Assistants and Nurse Practitioners) who all work together to provide you with the care you need, when you need it.  We recommend signing up for the patient portal called "MyChart".  Sign up information is provided on this After Visit Summary.  MyChart is used to connect with patients for Virtual Visits (Telemedicine).  Patients are able to view lab/test results, encounter notes, upcoming appointments, etc.  Non-urgent messages can be sent to your provider as well.   To learn more about what you can do with MyChart, go to [email or web address].     Other Instructions Echocardiogram An  echocardiogram is a test that uses sound waves (ultrasound) to produce images of the heart. Images from an echocardiogram can provide important information about: Heart size and shape. The size and thickness and movement of your heart's walls. Heart muscle function and strength. Heart valve function or if you have stenosis. Stenosis is when the heart valves are too narrow. If blood is flowing backward through the heart valves (regurgitation). A tumor or infectious growth around the heart valves. Areas of heart muscle that are not working well because of poor blood flow or injury from a heart attack. Aneurysm detection. An aneurysm is a weak or damaged part of an artery wall. The wall bulges out from the normal force of blood pumping through the body. Tell a health care provider about: Any allergies you have. All medicines you are taking, including vitamins, herbs, eye drops, creams, and over-the-counter medicines. Any blood disorders you have. Any surgeries you have had. Any medical conditions you have. Whether you are pregnant or may be pregnant. What are the risks? Generally, this is a safe test. However, problems may occur, including an allergic reaction to dye (contrast) that may be used during the test. What happens before the test? No specific preparation is needed. You may eat and drink normally. What happens during the test? You will take off your clothes from the waist up and put on a hospital gown. Electrodes or electrocardiogram (ECG)patches may be placed on your chest. The electrodes or patches are then connected to a device that monitors your heart rate and rhythm. You will lie down on a table for  an ultrasound exam. A gel will be applied to your chest to help sound waves pass through your skin. A handheld device, called a transducer, will be pressed against your chest and moved over your heart. The transducer produces sound waves that travel to your heart and bounce back (or  "echo" back) to the transducer. These sound waves will be captured in real-time and changed into images of your heart that can be viewed on a video monitor. The images will be recorded on a computer and reviewed by your health care provider. You may be asked to change positions or hold your breath for a short time. This makes it easier to get different views or better views of your heart. In some cases, you may receive contrast through an IV in one of your veins. This can improve the quality of the pictures from your heart. The procedure may vary among health care providers and hospitals.   What can I expect after the test? You may return to your normal, everyday life, including diet, activities, and medicines, unless your health care provider tells you not to do that. Follow these instructions at home: It is up to you to get the results of your test. Ask your health care provider, or the department that is doing the test, when your results will be ready. Keep all follow-up visits. This is important. Summary An echocardiogram is a test that uses sound waves (ultrasound) to produce images of the heart. Images from an echocardiogram can provide important information about the size and shape of your heart, heart muscle function, heart valve function, and other possible heart problems. You do not need to do anything to prepare before this test. You may eat and drink normally. After the echocardiogram is completed, you may return to your normal, everyday life, unless your health care provider tells you not to do that. This information is not intended to replace advice given to you by your health care provider. Make sure you discuss any questions you have with your health care provider. Document Revised: 06/04/2020 Document Reviewed: 06/04/2020 Elsevier Patient Education  2021 Elsevier Inc.      Follow-Up: At [facility], you and your health needs are our priority.  As part of our  continuing mission to provide you with exceptional heart care, we have created designated Provider Care Teams.  These Care Teams include your primary Cardiologist (physician) and Advanced Practice Providers (APPs -  Physician Assistants and Nurse Practitioners) who all work together to provide you with the care you need, when you need it.  We recommend signing up for the patient portal called "MyChart".  Sign up information is provided on this After Visit Summary.  MyChart is used to connect with patients for Virtual Visits (Telemedicine).  Patients are able to view lab/test results, encounter notes, upcoming appointments, etc.  Non-urgent messages can be sent to your provider as well.   To learn more about what you can do with MyChart, go to [email or web address].    Your next appointment:   6 month(s)  Provider:   Gypsy Balsam, MD    Other Instructions

## 2023-05-27 NOTE — Addendum Note (Signed)
Addended by: Heywood Bene on: 05/27/2023 10:53 AM   Modules accepted: Orders

## 2023-06-01 ENCOUNTER — Ambulatory Visit: Payer: Medicare Other

## 2023-06-01 DIAGNOSIS — N3001 Acute cystitis with hematuria: Secondary | ICD-10-CM

## 2023-06-01 LAB — POCT URINALYSIS DIP (CLINITEK)
Bilirubin, UA: NEGATIVE
Blood, UA: NEGATIVE
Glucose, UA: NEGATIVE mg/dL
Ketones, POC UA: NEGATIVE mg/dL
Leukocytes, UA: NEGATIVE
Nitrite, UA: NEGATIVE
POC PROTEIN,UA: NEGATIVE
Spec Grav, UA: 1.015 (ref 1.010–1.025)
Urobilinogen, UA: 0.2 E.U./dL
pH, UA: 7.5 (ref 5.0–8.0)

## 2023-06-01 NOTE — Progress Notes (Signed)
Patient presents for 2 week urine recheck.  He was seen 05/18/23 and treated with Cipro x 10 days.  Patient states that he is much better and his symptoms have subsided.  He is aware of improvement of UA results.   Component     Latest Ref Rng 05/18/2023 06/01/2023  Color, UA     yellow  yellow  yellow   Clarity, UA     clear  cloudy !  clear   Glucose     negative mg/dL negative  negative   Bilirubin, UA     negative  negative  negative   Ketones, UA     negative mg/dL negative  negative   Specific Gravity, UA     1.010 - 1.025  1.015  1.015   RBC, UA     negative  trace-intact !  negative   pH, UA     5.0 - 8.0  6.0  7.5   POC PROTEIN,UA     negative, trace  negative  negative   Urobilinogen, UA     0.2 or 1.0 E.U./dL 0.2  0.2   Nitrite, UA     Negative  Negative  Negative   Leukocytes,UA     Negative  Large (3+) !  Negative     Legend: ! Abnormal  Jacklynn Bue, LPN  46/96/29 52:84 AM

## 2023-06-08 ENCOUNTER — Ambulatory Visit: Payer: Medicare Other

## 2023-06-08 ENCOUNTER — Other Ambulatory Visit: Payer: Self-pay | Admitting: Physician Assistant

## 2023-06-08 DIAGNOSIS — K219 Gastro-esophageal reflux disease without esophagitis: Secondary | ICD-10-CM

## 2023-06-15 DIAGNOSIS — G4733 Obstructive sleep apnea (adult) (pediatric): Secondary | ICD-10-CM | POA: Diagnosis not present

## 2023-06-22 ENCOUNTER — Ambulatory Visit: Payer: Medicare Other | Attending: Cardiology

## 2023-06-22 DIAGNOSIS — I420 Dilated cardiomyopathy: Secondary | ICD-10-CM

## 2023-06-22 LAB — ECHOCARDIOGRAM COMPLETE
AR max vel: 2.94 cm2
AV Area VTI: 3.15 cm2
AV Area mean vel: 3.11 cm2
AV Mean grad: 3.7 mmHg
AV Peak grad: 7.7 mmHg
Ao pk vel: 1.39 m/s
Area-P 1/2: 2.78 cm2
Calc EF: 51.1 %
S' Lateral: 4.1 cm
Single Plane A2C EF: 52 %
Single Plane A4C EF: 50.2 %

## 2023-06-25 ENCOUNTER — Telehealth: Payer: Self-pay

## 2023-06-25 NOTE — Telephone Encounter (Signed)
Spoke with spouse per [facility] regarding Echo results per Dr. Vanetta Shawl note. Spouse verbalized understanding and had no further questions. Routed to PCP.

## 2023-07-05 ENCOUNTER — Other Ambulatory Visit: Payer: Self-pay | Admitting: Physician Assistant

## 2023-07-05 DIAGNOSIS — I10 Essential (primary) hypertension: Secondary | ICD-10-CM

## 2023-08-02 ENCOUNTER — Other Ambulatory Visit: Payer: Self-pay | Admitting: Physician Assistant

## 2023-08-02 DIAGNOSIS — M1A00X0 Idiopathic chronic gout, unspecified site, without tophus (tophi): Secondary | ICD-10-CM

## 2023-08-16 ENCOUNTER — Ambulatory Visit (INDEPENDENT_AMBULATORY_CARE_PROVIDER_SITE_OTHER): Payer: Medicare Other

## 2023-08-16 DIAGNOSIS — Z23 Encounter for immunization: Secondary | ICD-10-CM

## 2023-08-20 DIAGNOSIS — M25572 Pain in left ankle and joints of left foot: Secondary | ICD-10-CM | POA: Diagnosis not present

## 2023-08-20 DIAGNOSIS — S93402A Sprain of unspecified ligament of left ankle, initial encounter: Secondary | ICD-10-CM | POA: Diagnosis not present

## 2023-09-06 ENCOUNTER — Other Ambulatory Visit: Payer: Self-pay | Admitting: Physician Assistant

## 2023-09-09 ENCOUNTER — Other Ambulatory Visit: Payer: Self-pay | Admitting: Cardiology

## 2023-09-21 ENCOUNTER — Ambulatory Visit: Payer: Medicare Other | Admitting: Physician Assistant

## 2023-10-04 ENCOUNTER — Other Ambulatory Visit: Payer: Self-pay | Admitting: Physician Assistant

## 2023-10-04 ENCOUNTER — Telehealth: Payer: Self-pay

## 2023-10-04 DIAGNOSIS — I10 Essential (primary) hypertension: Secondary | ICD-10-CM

## 2023-10-04 NOTE — Telephone Encounter (Signed)
[facility] called and stated that Cardizem is on back order and wanted to know if it okay to give patient compare medication Tiazac.  Per Marianne Sofia, PA-C: okay to give medication.

## 2023-10-12 ENCOUNTER — Encounter: Payer: Self-pay | Admitting: Physician Assistant

## 2023-10-12 ENCOUNTER — Ambulatory Visit (INDEPENDENT_AMBULATORY_CARE_PROVIDER_SITE_OTHER): Payer: Medicare Other | Admitting: Physician Assistant

## 2023-10-12 VITALS — BP 144/80 | HR 69 | Temp 97.4°F | Resp 14 | Ht 76.0 in | Wt 216.0 lb

## 2023-10-12 DIAGNOSIS — K219 Gastro-esophageal reflux disease without esophagitis: Secondary | ICD-10-CM

## 2023-10-12 DIAGNOSIS — M1A00X0 Idiopathic chronic gout, unspecified site, without tophus (tophi): Secondary | ICD-10-CM | POA: Diagnosis not present

## 2023-10-12 DIAGNOSIS — R413 Other amnesia: Secondary | ICD-10-CM | POA: Diagnosis not present

## 2023-10-12 DIAGNOSIS — E782 Mixed hyperlipidemia: Secondary | ICD-10-CM

## 2023-10-12 DIAGNOSIS — R7303 Prediabetes: Secondary | ICD-10-CM | POA: Diagnosis not present

## 2023-10-12 DIAGNOSIS — I10 Essential (primary) hypertension: Secondary | ICD-10-CM | POA: Diagnosis not present

## 2023-10-12 DIAGNOSIS — R251 Tremor, unspecified: Secondary | ICD-10-CM | POA: Insufficient documentation

## 2023-10-12 DIAGNOSIS — G8929 Other chronic pain: Secondary | ICD-10-CM | POA: Insufficient documentation

## 2023-10-12 DIAGNOSIS — M545 Low back pain, unspecified: Secondary | ICD-10-CM

## 2023-10-12 DIAGNOSIS — N400 Enlarged prostate without lower urinary tract symptoms: Secondary | ICD-10-CM | POA: Insufficient documentation

## 2023-10-12 MED ORDER — MELOXICAM 15 MG PO TABS: 15.0000 mg | ORAL_TABLET | Freq: Every day | ORAL | 1 refills | Status: DC

## 2023-10-12 NOTE — Progress Notes (Signed)
Subjective:  Patient ID: Adam Bennett, male    DOB: Jul 10, 1941  Age: 82 y.o. MRN: [id number]  Chief Complaint  Patient presents with   Hypertension    HPI  Pt presents for follow up of hypertension. The patient is tolerating the medication well without side effects. Compliance with treatment has been good; including taking medication as directed , maintains a healthy diet and regular exercise regimen , and following up as directed.--  BP at home ranging 140-160s/60-80s Denies chest pain or dyspnea Currently on cardizem CD 360, Toprol XL 50 and zestril 40mg  qd  Pt with history of gout - stable on daily allopurinol - uses indocin prn but is having breakthrough pain with indocin Pt is troubled with arthritis in hands and chronic low back pain --- has not used other NSAID - will stop indocin and try another med to see if more helpful  Pt with history of GERD - symptoms controlled with protonix 40mg    Mixed hyperlipidemia  Pt presents with hyperlipidemia. Compliance with treatment has been good - The patient is compliant with medications, maintains a low cholesterol diet , follows up as directed , and maintains an exercise regimen . The patient denies experiencing any hypercholesterolemia related symptoms. Pt currently on crestor 10mg  qd  Pt complains of tremors in both hands - mostly on right - has been going on for awhile Worse with stress and also after working  Pt and wife concerned with mild memory changes.  Forgetting names and to do things when going in room.  No problem with remembering how to work machinery, drive, daily functions etc  Current Outpatient Medications on File Prior to Visit  Medication Sig Dispense Refill   acetaminophen (TYLENOL) 650 MG CR tablet Take 650 mg by mouth as needed for pain.     allopurinol (ZYLOPRIM) 100 MG tablet TAKE 1 TABLET BY MOUTH EVERY DAY 90 tablet 1   aspirin EC 81 MG tablet Take 81 mg by mouth daily.     diltiazem (CARDIZEM CD) 360 MG 24 hr  capsule TAKE 1 CAPSULE(360 MG) BY MOUTH DAILY 30 capsule 3   fluticasone (FLONASE) 50 MCG/ACT nasal spray Place 2 sprays into both nostrils daily. 16 g 6   lisinopril (ZESTRIL) 40 MG tablet TAKE 1 TABLET(40 MG) BY MOUTH DAILY (Patient taking differently: Take 40 mg by mouth daily.) 90 tablet 1   loratadine (CLARITIN) 10 MG tablet Take 10 mg by mouth as needed for allergies.     metoprolol succinate (TOPROL-XL) 50 MG 24 hr tablet Take 1 tablet (50 mg total) by mouth daily. 90 tablet 3   Multiple Vitamin (MULTIVITAMIN WITH MINERALS) TABS tablet Take 1 tablet by mouth daily. Unknown strength     pantoprazole (PROTONIX) 40 MG tablet TAKE 1 TABLET(40 MG) BY MOUTH DAILY 30 tablet 3   polyethylene glycol (MIRALAX / GLYCOLAX) packet Take 17 g by mouth daily as needed for mild constipation or moderate constipation.     rosuvastatin (CRESTOR) 10 MG tablet Take 1 tablet (10 mg total) by mouth daily. 90 tablet 3   No current facility-administered medications on file prior to visit.   Past Medical History:  Diagnosis Date   A-fib [facility])    Anal bleeding 02/03/2021   At increased risk of exposure to COVID-19 virus 10/21/2020   Atrial fibrillation (HCC) 06/25/2014   Bilateral carpal tunnel syndrome 02/22/2018   Bronchial pneumonia 10/21/2020   Cervical radiculopathy at C7 02/22/2018   Left   Chronic sinus complaints  Dilated cardiomyopathy (HCC)    Essential hypertension, benign 06/25/2014   GERD (gastroesophageal reflux disease)    Hematochezia 02/05/2020   Hypertension    Hypotension, unspecified 06/25/2014   NSVT (nonsustained ventricular tachycardia) (HCC)    Obstructive sleep apnea 05/08/2015   Pulmonary hypertension (HCC) 05/08/2015   Special screening examination for viral disease 02/05/2020   Spondylolisthesis of lumbar region 06/22/2014   Syncope 06/25/2014   Ulnar neuropathy at elbow 02/22/2018   Bilateral   Past Surgical History:  Procedure Laterality Date   BACK SURGERY     CARDIAC  CATHETERIZATION     DONE IN [place], 65YRS AGO   CHOLECYSTECTOMY     FOOT SURGERY     HEMORRHOID SURGERY     HERNIA REPAIR     JOINT REPLACEMENT     BILATER KNEE REPLACEMENTS   TRANSURETHRAL RESECTION OF PROSTATE      Family History  Problem Relation Age of Onset   Congestive Heart Failure Father    CAD Father    Congestive Heart Failure Brother    CAD Brother    Hypertension Other    Diabetes Other    Colon cancer Mother    Social History   Socioeconomic History   Marital status: Married    Spouse name: Not on file   Number of children: Not on file   Years of education: Not on file   Highest education level: Not on file  Occupational History   Not on file  Tobacco Use   Smoking status: Former    Current packs/day: 0.00    Types: Cigarettes    Quit date: 09/20/2007    Years since quitting: 16.0   Smokeless tobacco: Never  Vaping Use   Vaping status: Never Used  Substance and Sexual Activity   Alcohol use: Yes    Comment: occasional/rare   Drug use: No   Sexual activity: Not Currently  Other Topics Concern   Not on file  Social History Narrative   Not on file   Social Drivers of Health   Financial Resource Strain: Low Risk  (04/21/2023)   Overall Financial Resource Strain (CARDIA)    Difficulty of Paying Living Expenses: Not hard at all  Food Insecurity: No Food Insecurity (04/21/2023)   Hunger Vital Sign    Worried About Running Out of Food in the Last Year: Never true    Ran Out of Food in the Last Year: Never true  Transportation Needs: No Transportation Needs (04/21/2023)   PRAPARE - Administrator, Civil Service (Medical): No    Lack of Transportation (Non-Medical): No  Physical Activity: Sufficiently Active (04/21/2023)   Exercise Vital Sign    Days of Exercise per Week: 5 days    Minutes of Exercise per Session: 40 min  Stress: No Stress Concern Present (04/21/2023)   [employer] of Occupational Health - Occupational Stress  Questionnaire    Feeling of Stress : Not at all  Social Connections: Moderately Integrated (04/21/2023)   Social Connection and Isolation Panel [NHANES]    Frequency of Communication with Friends and Family: More than three times a week    Frequency of Social Gatherings with Friends and Family: More than three times a week    Attends Religious Services: More than 4 times per year    Active Member of [employer] or Organizations: No    Attends Banker Meetings: Never    Marital Status: Married   CONSTITUTIONAL: Negative for chills, fatigue, fever,  unintentional weight gain and unintentional weight loss.  E/N/T: Negative for ear pain, nasal congestion and sore throat.  CARDIOVASCULAR: Negative for chest pain, dizziness, palpitations and pedal edema.  RESPIRATORY: Negative for recent cough and dyspnea.  GASTROINTESTINAL: Negative for abdominal pain, acid reflux symptoms, constipation, diarrhea, nausea and vomiting.  MSK: see HPI INTEGUMENTARY: Negative for rash.  NEUROLOGICAL: see HPI PSYCHIATRIC: Negative for sleep disturbance and to question depression screen.  Negative for depression, negative for anhedonia.       Objective:  PHYSICAL EXAM:   VS: BP (!) 144/80   Pulse 69   Temp (!) 97.4 F (36.3 C)   Resp 14   Ht 6\' 4"  (1.93 m)   Wt 216 lb (98 kg)   SpO2 98%   BMI 26.29 kg/m   GEN: Well nourished, well developed, in no acute distress   Cardiac: RRR; no murmurs, rubs, or gallops,no edema - Respiratory:  normal respiratory rate and pattern with no distress - normal breath sounds with no rales, rhonchi, wheezes or rubs GI: normal bowel sounds, no masses or tenderness MS: no deformity or atrophy  Skin: warm and dry, no rash  Neuro:  Alert and Oriented x 3,  - CN II-Xii grossly intact Psych: euthymic mood, appropriate affect and demeanor     10/12/2023   11:07 AM  MMSE - Mini Mental State Exam  Orientation to time 5  Orientation to Place 5  Registration 3   Attention/ Calculation 4  Recall 3  Language- name 2 objects 2  Language- repeat 1  Language- follow 3 step command 3  Language- read & follow direction 1  Write a sentence 1  Copy design 1  Total score 29     Lab Results  Component Value Date   WBC 6.2 03/04/2023   HGB 15.6 03/04/2023   HCT 46.3 03/04/2023   PLT 235 03/04/2023   GLUCOSE 109 (H) 03/04/2023   CHOL 170 03/04/2023   TRIG 169 (H) 03/04/2023   HDL 45 03/04/2023   LDLCALC 96 03/04/2023   ALT 25 03/04/2023   AST 22 03/04/2023   NA 142 03/04/2023   K 4.7 03/04/2023   CL 103 03/04/2023   CREATININE 0.86 03/04/2023   BUN 11 03/04/2023   CO2 21 03/04/2023   TSH 1.120 03/04/2023   INR 0.93 07/02/2010   HGBA1C 6.2 (H) 03/04/2023      Assessment & Plan:   Problem List Items Addressed This Visit       Cardiovascular and Mediastinum   Essential hypertension, benign - Primary   Relevant Orders   Labwork pending Recommend to monitor bp and keep log - recheck in 3 weeks   Low salt diet   Continue current meds        Digestive   Gastroesophageal reflux disease without esophagitis Continue current meds     Other   Idiopathic chronic gout without tophus   Relevant Orders   Uric Acid Stop indocin and will try meloxicam 15mg  qd Continue allopurinol   Mixed hyperlipidemia   Relevant Orders   Lipid panel Watch diet   Chronic bilateral low back pain without sciatica Trial meloxicam 15mg  qd  Prediabetes Watch diet Hgb A1c pending  Enlarged prostate PSA  Memory changes Labwork pending    .  Meds ordered this encounter  Medications   meloxicam (MOBIC) 15 MG tablet    Sig: Take 1 tablet (15 mg total) by mouth daily.    Dispense:  30 tablet  Refill:  1    Supervising Provider:   Marianne Sofia [phone]    Orders Placed This Encounter  Procedures   CBC with Differential/Platelet   Comprehensive metabolic panel   TSH   Lipid panel   B12 and Folate Panel   Methylmalonic acid, serum    VITAMIN D 25 Hydroxy (Vit-D Deficiency, Fractures)   Uric acid   Hemoglobin A1c   PSA     Follow-up: No follow-ups on file.  An After Visit Summary was printed and given to the patient.  [facility] [phone]

## 2023-10-18 DIAGNOSIS — S93402A Sprain of unspecified ligament of left ankle, initial encounter: Secondary | ICD-10-CM | POA: Diagnosis not present

## 2023-10-18 LAB — COMPREHENSIVE METABOLIC PANEL
ALT: 21 [IU]/L (ref 0–44)
AST: 18 [IU]/L (ref 0–40)
Albumin: 4.4 g/dL (ref 3.8–4.8)
Alkaline Phosphatase: 94 [IU]/L (ref 44–121)
BUN/Creatinine Ratio: 11 (ref 10–24)
BUN: 10 mg/dL (ref 8–27)
Bilirubin Total: 0.5 mg/dL (ref 0.0–1.2)
CO2: 25 mmol/L (ref 20–29)
Calcium: 9.3 mg/dL (ref 8.6–10.2)
Chloride: 102 mmol/L (ref 96–106)
Creatinine, Ser: 0.9 mg/dL (ref 0.76–1.27)
Globulin, Total: 1.8 g/dL (ref 1.5–4.5)
Glucose: 113 mg/dL — ABNORMAL HIGH (ref 70–99)
Potassium: 5.2 mmol/L (ref 3.5–5.2)
Sodium: 140 mmol/L (ref 134–144)
Total Protein: 6.2 g/dL (ref 6.0–8.5)
eGFR: 86 mL/min/{1.73_m2} (ref >59)

## 2023-10-18 LAB — HEMOGLOBIN A1C
Est. average glucose Bld gHb Est-mCnc: 126 mg/dL
Hgb A1c MFr Bld: 6 % — ABNORMAL HIGH (ref 4.8–5.6)

## 2023-10-18 LAB — LIPID PANEL
Chol/HDL Ratio: 5.2 {ratio} — ABNORMAL HIGH (ref 0.0–5.0)
Cholesterol, Total: 212 mg/dL — ABNORMAL HIGH (ref 100–199)
HDL: 41 mg/dL (ref >39)
LDL Chol Calc (NIH): 134 mg/dL — ABNORMAL HIGH (ref 0–99)
Triglycerides: 205 mg/dL — ABNORMAL HIGH (ref 0–149)
VLDL Cholesterol Cal: 37 mg/dL (ref 5–40)

## 2023-10-18 LAB — CBC WITH DIFFERENTIAL/PLATELET
Basophils Absolute: 0 10*3/uL (ref 0.0–0.2)
Basos: 1 %
EOS (ABSOLUTE): 0.2 10*3/uL (ref 0.0–0.4)
Eos: 3 %
Hematocrit: 43 % (ref 37.5–51.0)
Hemoglobin: 14.4 g/dL (ref 13.0–17.7)
Immature Grans (Abs): 0 10*3/uL (ref 0.0–0.1)
Immature Granulocytes: 0 %
Lymphocytes Absolute: 1 10*3/uL (ref 0.7–3.1)
Lymphs: 21 %
MCH: 30.8 pg (ref 26.6–33.0)
MCHC: 33.5 g/dL (ref 31.5–35.7)
MCV: 92 fL (ref 79–97)
Monocytes Absolute: 0.5 10*3/uL (ref 0.1–0.9)
Monocytes: 9 %
Neutrophils Absolute: 3.2 10*3/uL (ref 1.4–7.0)
Neutrophils: 66 %
Platelets: 255 10*3/uL (ref 150–450)
RBC: 4.67 x10E6/uL (ref 4.14–5.80)
RDW: 12.3 % (ref 11.6–15.4)
WBC: 4.9 10*3/uL (ref 3.4–10.8)

## 2023-10-18 LAB — METHYLMALONIC ACID, SERUM: Methylmalonic Acid: 164 nmol/L (ref 0–378)

## 2023-10-18 LAB — TSH: TSH: 1.04 u[IU]/mL (ref 0.450–4.500)

## 2023-10-18 LAB — URIC ACID: Uric Acid: 5.1 mg/dL (ref 3.8–8.4)

## 2023-10-18 LAB — PSA: Prostate Specific Ag, Serum: 4.5 ng/mL — ABNORMAL HIGH (ref 0.0–4.0)

## 2023-10-18 LAB — B12 AND FOLATE PANEL
Folate: >20 ng/mL (ref >3.0)
Vitamin B-12: 601 pg/mL (ref 232–1245)

## 2023-10-18 LAB — VITAMIN D 25 HYDROXY (VIT D DEFICIENCY, FRACTURES): Vit D, 25-Hydroxy: 33.1 ng/mL (ref 30.0–100.0)

## 2023-10-22 ENCOUNTER — Other Ambulatory Visit: Payer: Self-pay

## 2023-10-22 ENCOUNTER — Telehealth: Payer: Self-pay

## 2023-10-22 DIAGNOSIS — E782 Mixed hyperlipidemia: Secondary | ICD-10-CM

## 2023-10-22 MED ORDER — EZETIMIBE 10 MG PO TABS: 10.0000 mg | ORAL_TABLET | Freq: Every day | ORAL | 0 refills | Status: DC

## 2023-10-22 NOTE — Telephone Encounter (Signed)
Copied from CRM [id number]. Topic: Clinical - Lab/Test Results >> Oct 22, 2023 11:34 AM Antony Haste wrote: Reason for CRM: PT's wife Kathie Rhodes states she was supposed to receive a callback concerning lab work results, PT is wanting Kennon Rounds to call her back with an update.  Callback # for [employer]: [phone]

## 2023-10-22 NOTE — Telephone Encounter (Signed)
Called wife made her aware of patient results

## 2023-10-25 ENCOUNTER — Other Ambulatory Visit: Payer: Self-pay | Admitting: Physician Assistant

## 2023-10-25 DIAGNOSIS — I10 Essential (primary) hypertension: Secondary | ICD-10-CM

## 2023-10-25 MED ORDER — PANTOPRAZOLE SODIUM 40 MG PO TBEC: 40.0000 mg | DELAYED_RELEASE_TABLET | Freq: Every day | ORAL | 1 refills | Status: DC

## 2023-10-25 MED ORDER — DILTIAZEM HCL ER COATED BEADS 360 MG PO CP24: 360.0000 mg | ORAL_CAPSULE | Freq: Every day | ORAL | 1 refills | Status: DC

## 2023-10-25 NOTE — Telephone Encounter (Signed)
Copied from CRM [id number]. Topic: Clinical - Medication Refill >> Oct 25, 2023 11:06 AM Shelah Lewandowsky wrote: Most Recent Primary Care Visit:  Provider: Marianne Sofia  Department: [facility]  Visit Type: OFFICE VISIT  Date: 10/12/2023  Medication: pantoprazole (PROTONIX) 40 MG tablet   Has the patient contacted their pharmacy?  (Agent: If no, request that the patient contact the pharmacy for the refill. If patient does not wish to contact the pharmacy document the reason why and proceed with request.) (Agent: If yes, when and what did the pharmacy advise?)  Is this the correct pharmacy for this prescription?  If no, delete pharmacy and type the correct one.  This is the patient's preferred pharmacy:  [id number], [place] - [address] [place] [address] Phone: [phone] Fax: [phone]   Has the prescription been filled recently?   Is the patient out of the medication?   Has the patient been seen for an appointment in the last year OR does the patient have an upcoming appointment?   Can we respond through MyChart?   Agent: Please be advised that Rx refills may take up to 3 business days. We ask that you follow-up with your pharmacy.

## 2023-10-26 DIAGNOSIS — S86312A Strain of muscle(s) and tendon(s) of peroneal muscle group at lower leg level, left leg, initial encounter: Secondary | ICD-10-CM | POA: Diagnosis not present

## 2023-10-28 ENCOUNTER — Ambulatory Visit: Payer: Medicare Other | Admitting: Physician Assistant

## 2023-10-28 ENCOUNTER — Encounter: Payer: Self-pay | Admitting: Physician Assistant

## 2023-10-28 VITALS — BP 114/64 | HR 67 | Temp 98.4°F | Resp 18 | Ht 76.0 in | Wt 218.8 lb

## 2023-10-28 DIAGNOSIS — J060 Acute laryngopharyngitis: Secondary | ICD-10-CM | POA: Diagnosis not present

## 2023-10-28 MED ORDER — HYDROCODONE BIT-HOMATROP MBR 5-1.5 MG/5ML PO SOLN: 5.0000 mL | Freq: Four times a day (QID) | ORAL | 0 refills | Status: DC | PRN

## 2023-10-28 MED ORDER — AZITHROMYCIN 250 MG PO TABS: ORAL_TABLET | ORAL | 0 refills | Status: AC

## 2023-10-28 MED ORDER — TRIAMCINOLONE ACETONIDE 40 MG/ML IJ SUSP
60.0000 mg | Freq: Once | INTRAMUSCULAR | Status: AC
  Administered 2023-10-28: 60 mg via INTRAMUSCULAR

## 2023-10-28 NOTE — Progress Notes (Signed)
 Acute Office Visit  Subjective:    Patient ID: Adam Bennett, male    DOB: 01/07/1941, 83 y.o.   MRN: [id number]  Chief Complaint  Patient presents with   Cough   Sore Throat    HPI: Patient is in today for  complaints of sore throat, sinus pressure and productive cough for about a week - states is doing some better today Denies fever or malaise - denies chest pain or dyspnea   Current Outpatient Medications:    acetaminophen  (TYLENOL ) 650 MG CR tablet, Take 650 mg by mouth as needed for pain., Disp: , Rfl:    allopurinol  (ZYLOPRIM ) 100 MG tablet, TAKE 1 TABLET BY MOUTH EVERY DAY, Disp: 90 tablet, Rfl: 1   aspirin  EC 81 MG tablet, Take 81 mg by mouth daily., Disp: , Rfl:    azithromycin  (ZITHROMAX ) 250 MG tablet, Take 2 tablets on day 1, then 1 tablet daily on days 2 through 5, Disp: 6 tablet, Rfl: 0   diltiazem  (CARDIZEM  CD) 360 MG 24 hr capsule, Take 1 capsule (360 mg total) by mouth daily., Disp: 90 capsule, Rfl: 1   ezetimibe  (ZETIA ) 10 MG tablet, Take 1 tablet (10 mg total) by mouth daily., Disp: 90 tablet, Rfl: 0   fluticasone  (FLONASE ) 50 MCG/ACT nasal spray, Place 2 sprays into both nostrils daily., Disp: 16 g, Rfl: 6   HYDROcodone  bit-homatropine (HYDROMET) 5-1.5 MG/5ML syrup, Take 5 mLs by mouth every 6 (six) hours as needed., Disp: 120 mL, Rfl: 0   lisinopril  (ZESTRIL ) 40 MG tablet, TAKE 1 TABLET(40 MG) BY MOUTH DAILY (Patient taking differently: Take 40 mg by mouth daily.), Disp: 90 tablet, Rfl: 1   loratadine (CLARITIN) 10 MG tablet, Take 10 mg by mouth as needed for allergies., Disp: , Rfl:    meloxicam  (MOBIC ) 15 MG tablet, Take 1 tablet (15 mg total) by mouth daily., Disp: 30 tablet, Rfl: 1   metoprolol  succinate (TOPROL -XL) 50 MG 24 hr tablet, Take 1 tablet (50 mg total) by mouth daily., Disp: 90 tablet, Rfl: 3   Multiple Vitamin (MULTIVITAMIN WITH MINERALS) TABS tablet, Take 1 tablet by mouth daily. Unknown strength, Disp: , Rfl:    pantoprazole  (PROTONIX ) 40 MG  tablet, Take 1 tablet (40 mg total) by mouth daily., Disp: 90 tablet, Rfl: 1   polyethylene glycol (MIRALAX / GLYCOLAX) packet, Take 17 g by mouth daily as needed for mild constipation or moderate constipation., Disp: , Rfl:    rosuvastatin  (CRESTOR ) 10 MG tablet, Take 1 tablet (10 mg total) by mouth daily., Disp: 90 tablet, Rfl: 3  Current Facility-Administered Medications:    triamcinolone  acetonide (KENALOG -40) injection 60 mg, 60 mg, Intramuscular, Once,   No Known Allergies  ROS CONSTITUTIONAL: Negative for chills, fatigue, fever,  E/N/T: see HPI CARDIOVASCULAR: Negative for chest pain, dizziness, palpitations and pedal edema.  RESPIRATORY: see HPI GASTROINTESTINAL: Negative for abdominal pain, acid reflux symptoms, constipation, diarrhea, nausea and vomiting.       Objective:    PHYSICAL EXAM:   BP 114/64 (BP Location: Left Arm, Patient Position: Sitting, Cuff Size: Large)   Pulse 67   Temp 98.4 F (36.9 C) (Temporal)   Resp 18   Ht 6' 4 (1.93 m)   Wt 218 lb 12.8 oz (99.2 kg)   SpO2 97%   BMI 26.63 kg/m    GEN: Well nourished, well developed, in no acute distress  HEENT: normal external ears and nose - normal external auditory canals and TMS -- Lips, Teeth and  Gums - normal  Oropharynx -erythema/pnd Cardiac: RRR; no murmurs, rubs, Respiratory:  normal respiratory rate and pattern with no distress - normal breath sounds with no rales, rhonchi, wheezes or rubs      Assessment & Plan:    Acute laryngopharyngitis -     Triamcinolone  Acetonide 60mg  given IM -     Azithromycin ; Take 2 tablets on day 1, then 1 tablet daily on days 2 through 5  Dispense: 6 tablet; Refill: 0 -     HYDROcodone  Bit-Homatrop MBr; Take 5 mLs by mouth every 6 (six) hours as needed.  Dispense: 120 mL; Refill: 0     Follow-up: Return if symptoms worsen or fail to improve.  An After Visit Summary was printed and given to the patient.  CAMIE JONELLE NICHOLAUS DEVONNA [facility] [phone]

## 2023-10-31 ENCOUNTER — Other Ambulatory Visit: Payer: Self-pay | Admitting: Physician Assistant

## 2023-10-31 DIAGNOSIS — I10 Essential (primary) hypertension: Secondary | ICD-10-CM

## 2023-11-01 ENCOUNTER — Other Ambulatory Visit: Payer: Self-pay | Admitting: Physician Assistant

## 2023-11-04 ENCOUNTER — Encounter: Payer: Self-pay | Admitting: Physician Assistant

## 2023-11-04 ENCOUNTER — Ambulatory Visit (INDEPENDENT_AMBULATORY_CARE_PROVIDER_SITE_OTHER): Payer: Medicare Other | Admitting: Physician Assistant

## 2023-11-04 VITALS — BP 128/68 | HR 66 | Temp 97.6°F | Ht 76.0 in | Wt 218.0 lb

## 2023-11-04 DIAGNOSIS — Z23 Encounter for immunization: Secondary | ICD-10-CM | POA: Insufficient documentation

## 2023-11-04 DIAGNOSIS — G8929 Other chronic pain: Secondary | ICD-10-CM

## 2023-11-04 DIAGNOSIS — R9431 Abnormal electrocardiogram [ECG] [EKG]: Secondary | ICD-10-CM | POA: Insufficient documentation

## 2023-11-04 DIAGNOSIS — R0609 Other forms of dyspnea: Secondary | ICD-10-CM | POA: Insufficient documentation

## 2023-11-04 DIAGNOSIS — I10 Essential (primary) hypertension: Secondary | ICD-10-CM

## 2023-11-04 DIAGNOSIS — M545 Low back pain, unspecified: Secondary | ICD-10-CM | POA: Diagnosis not present

## 2023-11-04 MED ORDER — MELOXICAM 15 MG PO TABS: 15.0000 mg | ORAL_TABLET | Freq: Every day | ORAL | 1 refills | Status: DC

## 2023-11-04 MED ORDER — LISINOPRIL 40 MG PO TABS: 40.0000 mg | ORAL_TABLET | Freq: Every day | ORAL | 1 refills | Status: DC

## 2023-11-04 MED ORDER — DILTIAZEM HCL ER BEADS 360 MG PO CP24: 360.0000 mg | ORAL_CAPSULE | Freq: Every day | ORAL | 1 refills | Status: DC

## 2023-11-04 NOTE — Progress Notes (Signed)
 Subjective:  Patient ID: Adam Bennett, male    DOB: [birth year]  Age: 83 y.o. MRN: [id number]  Chief Complaint  Patient presents with   Hypertension    3 Week follow up    HPI Pt in today for bp follow up .  He has been taking his bp at home and averaging around 140s/80s mostly --however this morning he did have a reading at 187/102 He denies chest pain, malaise, headaches He brought his bp cuff with him and it reads a bit higher than ours Our bp today 128/68 --- his cuff - 145/85 Upon further questioning although he has not had any chest pain he has noted that since the summer he has been experiencing exertional dyspnea walking a good ways and with any amount of moderate physical activity -- that is a new symptom for him He denies chest pain but states he belches a lot and has indigestion - has noted more than usual Last chest xray 1/23 - normal He has seen cardiology and had an echocardiogram done which showed slightly low ejection fraction with aortic root enlargement He has an upcoming appt in 2 weeks for follow up  Pt would like pneumonia vaccine today     04/21/2023    1:24 PM 08/06/2022    2:30 PM 03/02/2022    9:54 AM 08/05/2021    9:00 AM  Depression screen PHQ 2/9  Decreased Interest 0 0 0 0  Down, Depressed, Hopeless 0 0 0 0  PHQ - 2 Score 0 0 0 0  Altered sleeping  0    Tired, decreased energy  0    Change in appetite  0    Feeling bad or failure about yourself   0    Trouble concentrating  0    Moving slowly or fidgety/restless  0    Suicidal thoughts  0    PHQ-9 Score  0    Difficult doing work/chores  Not difficult at all          03/02/2022    9:53 AM 08/06/2022    2:30 PM 03/04/2023    9:45 AM 04/21/2023    1:24 PM  Fall Risk  Falls in the past year? 1 1 0 1  Was there an injury with Fall? 0 0 0 0  Fall Risk Category Calculator 2 2 0 1  Fall Risk Category (Retired) Moderate Moderate    (RETIRED) Patient Fall Risk Level Moderate fall risk Moderate fall  risk    Patient at Risk for Falls Due to  Impaired balance/gait No Fall Risks History of fall(s);No Fall Risks  Fall risk Follow up  Falls evaluation completed;Falls prevention discussed Falls evaluation completed Follow up appointment     ROS CONSTITUTIONAL: Negative for chills, fatigue, fever, unintentional weight gain and unintentional weight loss.  E/N/T: Negative for ear pain, nasal congestion and sore throat.  CARDIOVASCULAR: see HPI RESPIRATORY: see HPI GASTROINTESTINAL: see HPI MSK: Negative for arthralgias and myalgias.  INTEGUMENTARY: Negative for rash.  NEUROLOGICAL: Negative for dizziness and headaches.  PSYCHIATRIC: Negative for sleep disturbance and to question depression screen.  Negative for depression, negative for anhedonia.    Current Outpatient Medications:    acetaminophen  (TYLENOL ) 650 MG CR tablet, Take 650 mg by mouth as needed for pain., Disp: , Rfl:    allopurinol  (ZYLOPRIM ) 100 MG tablet, TAKE 1 TABLET BY MOUTH EVERY DAY, Disp: 90 tablet, Rfl: 1   aspirin  EC 81 MG tablet, Take 81 mg by mouth  daily., Disp: , Rfl:    ezetimibe  (ZETIA ) 10 MG tablet, Take 1 tablet (10 mg total) by mouth daily., Disp: 90 tablet, Rfl: 0   fluticasone  (FLONASE ) 50 MCG/ACT nasal spray, Place 2 sprays into both nostrils daily., Disp: 16 g, Rfl: 6   HYDROcodone  bit-homatropine (HYDROMET) 5-1.5 MG/5ML syrup, Take 5 mLs by mouth every 6 (six) hours as needed., Disp: 120 mL, Rfl: 0   loratadine (CLARITIN) 10 MG tablet, Take 10 mg by mouth as needed for allergies., Disp: , Rfl:    metoprolol  succinate (TOPROL -XL) 50 MG 24 hr tablet, Take 1 tablet (50 mg total) by mouth daily., Disp: 90 tablet, Rfl: 3   Multiple Vitamin (MULTIVITAMIN WITH MINERALS) TABS tablet, Take 1 tablet by mouth daily. Unknown strength, Disp: , Rfl:    pantoprazole  (PROTONIX ) 40 MG tablet, Take 1 tablet (40 mg total) by mouth daily., Disp: 90 tablet, Rfl: 1   polyethylene glycol (MIRALAX / GLYCOLAX) packet, Take 17 g by  mouth daily as needed for mild constipation or moderate constipation., Disp: , Rfl:    diltiazem  (TIAZAC ) 360 MG 24 hr capsule, Take 1 capsule (360 mg total) by mouth daily., Disp: 90 capsule, Rfl: 1   lisinopril  (ZESTRIL ) 40 MG tablet, Take 1 tablet (40 mg total) by mouth daily., Disp: 90 tablet, Rfl: 1   meloxicam  (MOBIC ) 15 MG tablet, Take 1 tablet (15 mg total) by mouth daily., Disp: 90 tablet, Rfl: 1   rosuvastatin  (CRESTOR ) 10 MG tablet, Take 1 tablet (10 mg total) by mouth daily., Disp: 90 tablet, Rfl: 3  Past Medical History:  Diagnosis Date   A-fib (HCC)    Anal bleeding 02/03/2021   At increased risk of exposure to COVID-19 virus 10/21/2020   Atrial fibrillation (HCC) 06/25/2014   Bilateral carpal tunnel syndrome 02/22/2018   Bronchial pneumonia 10/21/2020   Cervical radiculopathy at C7 02/22/2018   Left   Chronic sinus complaints    Dilated cardiomyopathy (HCC)    Essential hypertension, benign 06/25/2014   GERD (gastroesophageal reflux disease)    Hematochezia 02/05/2020   Hypertension    Hypotension, unspecified 06/25/2014   NSVT (nonsustained ventricular tachycardia) (HCC)    Obstructive sleep apnea 05/08/2015   Pulmonary hypertension (HCC) 05/08/2015   Special screening examination for viral disease 02/05/2020   Spondylolisthesis of lumbar region 06/22/2014   Syncope 06/25/2014   Ulnar neuropathy at elbow 02/22/2018   Bilateral   Objective:  PHYSICAL EXAM:   VS: BP 128/68 (BP Location: Left Arm, Patient Position: Sitting)   Pulse 66   Temp 97.6 F (36.4 C) (Temporal)   Ht 6' 4 (1.93 m)   Wt 218 lb (98.9 kg)   SpO2 98%   BMI 26.54 kg/m   GEN: Well nourished, well developed, in no acute distress  Cardiac: RRR; no murmurs, rubs, or gallops,no edema - Respiratory:  normal respiratory rate and pattern with no distress - normal breath sounds with no rales, rhonchi, wheezes or rubs GI: normal bowel sounds, no masses or tenderness Skin: warm and dry, no rash  Neuro:   Alert and Oriented x 3, - CN II-Xii grossly intact Psych: euthymic mood, appropriate affect and demeanor  EKG - mild Twave changes noted in V1, V2 Assessment & Plan:    Primary hypertension -     EKG 12-Lead -     dilTIAZem  HCl ER Beads; Take 1 capsule (360 mg total) by mouth daily.  Dispense: 90 capsule; Refill: 1 -     Lisinopril ; Take 1  tablet (40 mg total) by mouth daily.  Dispense: 90 tablet; Refill: 1  Exertional dyspnea -     EKG 12-Lead Follow up with cardiology as directed to see if further workup warranted with bp fluctuations and dyspnea (stress testing)  Need for vaccination for pneumococcus -     Pneumococcal conjugate vaccine 20-valent  Chronic bilateral low back pain without sciatica -     Meloxicam ; Take 1 tablet (15 mg total) by mouth daily.  Dispense: 90 tablet; Refill: 1 Abnormal EKG Will send for troponin - if negative will contact cardiology and see about moving up his appt     Follow-up: Return in about 4 weeks (around 12/02/2023) for follow-up.  An After Visit Summary was printed and given to the patient.  CAMIE JONELLE NICHOLAUS DEVONNA [facility] [phone]

## 2023-11-05 ENCOUNTER — Encounter: Payer: Self-pay | Admitting: Physician Assistant

## 2023-11-07 ENCOUNTER — Other Ambulatory Visit: Payer: Self-pay | Admitting: Physician Assistant

## 2023-11-07 DIAGNOSIS — J060 Acute laryngopharyngitis: Secondary | ICD-10-CM

## 2023-11-07 MED ORDER — HYDROCODONE BIT-HOMATROP MBR 5-1.5 MG/5ML PO SOLN: 5.0000 mL | Freq: Four times a day (QID) | ORAL | 0 refills | Status: DC | PRN

## 2023-11-09 DIAGNOSIS — H2511 Age-related nuclear cataract, right eye: Secondary | ICD-10-CM | POA: Diagnosis not present

## 2023-11-24 ENCOUNTER — Ambulatory Visit: Payer: Medicare Other | Attending: Cardiology | Admitting: Cardiology

## 2023-11-24 ENCOUNTER — Encounter: Payer: Self-pay | Admitting: Cardiology

## 2023-11-24 VITALS — BP 128/60 | HR 68 | Ht 75.0 in | Wt 215.8 lb

## 2023-11-24 DIAGNOSIS — I420 Dilated cardiomyopathy: Secondary | ICD-10-CM

## 2023-11-24 DIAGNOSIS — G4733 Obstructive sleep apnea (adult) (pediatric): Secondary | ICD-10-CM | POA: Diagnosis not present

## 2023-11-24 DIAGNOSIS — R0609 Other forms of dyspnea: Secondary | ICD-10-CM

## 2023-11-24 DIAGNOSIS — E782 Mixed hyperlipidemia: Secondary | ICD-10-CM | POA: Diagnosis not present

## 2023-11-24 DIAGNOSIS — I480 Paroxysmal atrial fibrillation: Secondary | ICD-10-CM | POA: Diagnosis not present

## 2023-11-24 DIAGNOSIS — I10 Essential (primary) hypertension: Secondary | ICD-10-CM

## 2023-11-24 DIAGNOSIS — I4729 Other ventricular tachycardia: Secondary | ICD-10-CM | POA: Diagnosis not present

## 2023-11-24 NOTE — Progress Notes (Signed)
Cardiology Office Note:    Date:  11/24/2023   ID:  Adam Bennett, DOB 11-Nov-1940, MRN [id number]  PCP:  Marianne Sofia, PA-C  Cardiologist:  Gypsy Balsam, MD    Referring MD: Marianne Sofia, PA-C   Chief Complaint  Patient presents with   BP concerns    History of Present Illness:    Adam Bennett is a 83 y.o. male past medical history significant for essential hypertension which seems to be somewhat difficult to control, atrial fibrillation 1 documented episode, he does not want to be anticoagulated, COPD, obstructive sleep apnea.  Comes today to my office for follow-up.  Concern is about his blood pressure being elevated he brought blood pressure measurements from home as it is high however in the office today is perfect.  He also complained of being tired and exhausted.  He does have some shock on his property when he goes downhill he is fine going absolutely from shortness of breath.  But he said this is being creeping up on him for years since nothing new.  Denies have any chest pain tightness squeezing pressure burning chest  Past Medical History:  Diagnosis Date   A-fib (HCC)    Anal bleeding 02/03/2021   At increased risk of exposure to COVID-19 virus 10/21/2020   Atrial fibrillation (HCC) 06/25/2014   Bilateral carpal tunnel syndrome 02/22/2018   Bronchial pneumonia 10/21/2020   Cervical radiculopathy at C7 02/22/2018   Left   Chronic sinus complaints    Dilated cardiomyopathy (HCC)    Essential hypertension, benign 06/25/2014   GERD (gastroesophageal reflux disease)    Hematochezia 02/05/2020   Hypertension    Hypotension, unspecified 06/25/2014   NSVT (nonsustained ventricular tachycardia) (HCC)    Obstructive sleep apnea 05/08/2015   Pulmonary hypertension (HCC) 05/08/2015   Special screening examination for viral disease 02/05/2020   Spondylolisthesis of lumbar region 06/22/2014   Syncope 06/25/2014   Ulnar neuropathy at elbow 02/22/2018   Bilateral    Past Surgical History:   Procedure Laterality Date   BACK SURGERY     CARDIAC CATHETERIZATION     DONE IN [place], 45YRS AGO   CHOLECYSTECTOMY     FOOT SURGERY     HEMORRHOID SURGERY     HERNIA REPAIR     JOINT REPLACEMENT     BILATER KNEE REPLACEMENTS   TRANSURETHRAL RESECTION OF PROSTATE      Current Medications: Current Meds  Medication Sig   acetaminophen (TYLENOL) 650 MG CR tablet Take 650 mg by mouth as needed for pain.   allopurinol (ZYLOPRIM) 100 MG tablet TAKE 1 TABLET BY MOUTH EVERY DAY   aspirin EC 81 MG tablet Take 81 mg by mouth daily.   diltiazem (TIAZAC) 360 MG 24 hr capsule Take 1 capsule (360 mg total) by mouth daily.   ezetimibe (ZETIA) 10 MG tablet Take 1 tablet (10 mg total) by mouth daily.   fluticasone (FLONASE) 50 MCG/ACT nasal spray Place 2 sprays into both nostrils daily.   HYDROcodone bit-homatropine (HYDROMET) 5-1.5 MG/5ML syrup Take 5 mLs by mouth every 6 (six) hours as needed. (Patient taking differently: Take 5 mLs by mouth every 6 (six) hours as needed for cough.)   lisinopril (ZESTRIL) 40 MG tablet Take 1 tablet (40 mg total) by mouth daily.   loratadine (CLARITIN) 10 MG tablet Take 10 mg by mouth as needed for allergies.   meloxicam (MOBIC) 15 MG tablet Take 1 tablet (15 mg total) by mouth daily.   metoprolol  succinate (TOPROL-XL) 50 MG 24 hr tablet Take 1 tablet (50 mg total) by mouth daily.   Multiple Vitamin (MULTIVITAMIN WITH MINERALS) TABS tablet Take 1 tablet by mouth daily. Unknown strength   pantoprazole (PROTONIX) 40 MG tablet Take 1 tablet (40 mg total) by mouth daily.   polyethylene glycol (MIRALAX / GLYCOLAX) packet Take 17 g by mouth daily as needed for mild constipation or moderate constipation.     Allergies:   Patient has no known allergies.   Social History   Socioeconomic History   Marital status: Married    Spouse name: Not on file   Number of children: Not on file   Years of education: Not on file   Highest education level: Not on file   Occupational History   Not on file  Tobacco Use   Smoking status: Former    Current packs/day: 0.00    Types: Cigarettes    Quit date: 09/20/2007    Years since quitting: 16.1   Smokeless tobacco: Never  Vaping Use   Vaping status: Never Used  Substance and Sexual Activity   Alcohol use: Yes    Comment: occasional/rare   Drug use: No   Sexual activity: Not Currently  Other Topics Concern   Not on file  Social History Narrative   Not on file   Social Drivers of Health   Financial Resource Strain: Low Risk  (04/21/2023)   Overall Financial Resource Strain (CARDIA)    Difficulty of Paying Living Expenses: Not hard at all  Food Insecurity: No Food Insecurity (04/21/2023)   Hunger Vital Sign    Worried About Running Out of Food in the Last Year: Never true    Ran Out of Food in the Last Year: Never true  Transportation Needs: No Transportation Needs (04/21/2023)   PRAPARE - Administrator, Civil Service (Medical): No    Lack of Transportation (Non-Medical): No  Physical Activity: Sufficiently Active (04/21/2023)   Exercise Vital Sign    Days of Exercise per Week: 5 days    Minutes of Exercise per Session: 40 min  Stress: No Stress Concern Present (04/21/2023)   [employer] of Occupational Health - Occupational Stress Questionnaire    Feeling of Stress : Not at all  Social Connections: Moderately Integrated (04/21/2023)   Social Connection and Isolation Panel [NHANES]    Frequency of Communication with Friends and Family: More than three times a week    Frequency of Social Gatherings with Friends and Family: More than three times a week    Attends Religious Services: More than 4 times per year    Active Member of [employer] or Organizations: No    Attends Engineer, structural: Never    Marital Status: Married     Family History: The patient's family history includes CAD in his brother and father; Colon cancer in his mother; Congestive Heart Failure in  his brother and father; Diabetes in an other family member; Hypertension in an other family member. ROS:   Please see the history of present illness.    All 14 point review of systems negative except as described per history of present illness  EKGs/Labs/Other Studies Reviewed:    EKG Interpretation Date/Time:  Wednesday November 24 2023 15:36:33 EST Ventricular Rate:  68 PR Interval:  192 QRS Duration:  114 QT Interval:  404 QTC Calculation: 429 R Axis:   -45  Text Interpretation: Normal sinus rhythm Left anterior fascicular block Left ventricular hypertrophy with repolarization abnormality  Abnormal ECG When compared with ECG of 27-May-2023 10:11, No significant change was found Confirmed by Gypsy Balsam [phone]) on 11/24/2023 3:40:47 PM    Recent Labs: 10/12/2023: ALT 21; BUN 10; Creatinine, Ser 0.90; Hemoglobin 14.4; Platelets 255; Potassium 5.2; Sodium 140; TSH 1.040  Recent Lipid Panel    Component Value Date/Time   CHOL 212 (H) 10/12/2023 1114   TRIG 205 (H) 10/12/2023 1114   HDL 41 10/12/2023 1114   CHOLHDL 5.2 (H) 10/12/2023 1114   LDLCALC 134 (H) 10/12/2023 1114    Physical Exam:    VS:  BP 128/60 (BP Location: Right Arm, Patient Position: Sitting)   Pulse 68   Ht 6\' 3"  (1.905 m)   Wt 215 lb 12.8 oz (97.9 kg)   SpO2 93%   BMI 26.97 kg/m     Wt Readings from Last 3 Encounters:  11/24/23 215 lb 12.8 oz (97.9 kg)  11/04/23 218 lb (98.9 kg)  10/28/23 218 lb 12.8 oz (99.2 kg)     GEN:  Well nourished, well developed in no acute distress HEENT: Normal NECK: No JVD; No carotid bruits LYMPHATICS: No lymphadenopathy CARDIAC: RRR, no murmurs, no rubs, no gallops RESPIRATORY:  Clear to auscultation without rales, wheezing or rhonchi  ABDOMEN: Soft, non-tender, non-distended MUSCULOSKELETAL:  No edema; No deformity  SKIN: Warm and dry LOWER EXTREMITIES: no swelling NEUROLOGIC:  Alert and oriented x 3 PSYCHIATRIC:  Normal affect   ASSESSMENT:    1.  Essential hypertension, benign   2. Dilated cardiomyopathy (HCC)   3. Paroxysmal atrial fibrillation (HCC)   4. Primary hypertension   5. NSVT (nonsustained ventricular tachycardia) (HCC)   6. Obstructive sleep apnea    PLAN:    In order of problems listed above:  Essential hypertension: Blood pressure normal in the office but he brought blood pressure measurements from home elevated I will check Chem-7 and CBC does have lower limits of normal ejection fraction previously will switching from lisinopril to [facility] but again I need to check Chem-7 first. History of cardiomyopathy last ejection fraction lower limits of normal plan will be to switch to [facility]. Paroxysmal atrial fibrillation denies having palpitations.  Does not want to be anticoagulated. Dyslipidemia he is on Crestor I did review K PN which show LDL 134 HDL 41.  Will check direct LDL today as well as Chem-7 he may require higher dose of medications. History of nonsustained ventricular tachycardia denies having any palpitations   Medication Adjustments/Labs and Tests Ordered: Current medicines are reviewed at length with the patient today.  Concerns regarding medicines are outlined above.  Orders Placed This Encounter  Procedures   EKG 12-Lead   Medication changes: No orders of the defined types were placed in this encounter.   Signed, Georgeanna Lea, MD, [facility] 11/24/2023 3:54 PM    [facility]

## 2023-11-24 NOTE — Addendum Note (Signed)
Addended by: Baldo Ash D on: 11/24/2023 04:06 PM   Modules accepted: Orders

## 2023-11-24 NOTE — Patient Instructions (Addendum)
Medication Instructions:  Your physician recommends that you continue on your current medications as directed. Please refer to the Current Medication list given to you today.  *If you need a refill on your cardiac medications before your next appointment, please call your pharmacy*   Lab Work: BMP, ProBNP, Direct LDL- today If you have labs (blood work) drawn today and your tests are completely normal, you will receive your results only by: MyChart Message (if you have MyChart) OR A paper copy in the mail If you have any lab test that is abnormal or we need to change your treatment, we will call you to review the results.   Testing/Procedures:  24 hour BP monitor- Will call when available   Follow-Up: At [facility], you and your health needs are our priority.  As part of our continuing mission to provide you with exceptional heart care, we have created designated Provider Care Teams.  These Care Teams include your primary Cardiologist (physician) and Advanced Practice Providers (APPs -  Physician Assistants and Nurse Practitioners) who all work together to provide you with the care you need, when you need it.  We recommend signing up for the patient portal called "MyChart".  Sign up information is provided on this After Visit Summary.  MyChart is used to connect with patients for Virtual Visits (Telemedicine).  Patients are able to view lab/test results, encounter notes, upcoming appointments, etc.  Non-urgent messages can be sent to your provider as well.   To learn more about what you can do with MyChart, go to [email or web address].    Your next appointment:   3 month(s)  The format for your next appointment:   In Person  Provider:   Gypsy Balsam, MD    Other Instructions NA

## 2023-11-25 LAB — BASIC METABOLIC PANEL
BUN/Creatinine Ratio: 17 (ref 10–24)
BUN: 15 mg/dL (ref 8–27)
CO2: 21 mmol/L (ref 20–29)
Calcium: 9.1 mg/dL (ref 8.6–10.2)
Chloride: 105 mmol/L (ref 96–106)
Creatinine, Ser: 0.88 mg/dL (ref 0.76–1.27)
Glucose: 105 mg/dL — ABNORMAL HIGH (ref 70–99)
Potassium: 4.2 mmol/L (ref 3.5–5.2)
Sodium: 140 mmol/L (ref 134–144)
eGFR: 87 mL/min/{1.73_m2} (ref >59)

## 2023-11-25 LAB — LDL CHOLESTEROL, DIRECT: LDL Direct: 93 mg/dL (ref 0–99)

## 2023-11-25 LAB — PRO B NATRIURETIC PEPTIDE: NT-Pro BNP: 363 pg/mL (ref 0–486)

## 2023-11-29 ENCOUNTER — Other Ambulatory Visit: Payer: Self-pay

## 2023-11-29 DIAGNOSIS — G8929 Other chronic pain: Secondary | ICD-10-CM

## 2023-11-29 MED ORDER — MELOXICAM 15 MG PO TABS: 15.0000 mg | ORAL_TABLET | Freq: Every day | ORAL | 1 refills | Status: DC

## 2023-11-30 ENCOUNTER — Ambulatory Visit: Payer: Medicare Other | Admitting: Cardiology

## 2023-11-30 ENCOUNTER — Ambulatory Visit: Payer: Medicare Other | Attending: Cardiology

## 2023-11-30 DIAGNOSIS — I10 Essential (primary) hypertension: Secondary | ICD-10-CM

## 2023-12-01 ENCOUNTER — Telehealth: Payer: Self-pay

## 2023-12-01 ENCOUNTER — Other Ambulatory Visit: Payer: Self-pay | Admitting: Cardiology

## 2023-12-01 MED ORDER — ENTRESTO 24-26 MG PO TABS: 1.0000 | ORAL_TABLET | Freq: Two times a day (BID) | ORAL | 6 refills | Status: DC

## 2023-12-01 NOTE — Addendum Note (Signed)
 Addended by: Shawnee Dellen D on: 12/01/2023 09:29 AM   Modules accepted: Orders

## 2023-12-01 NOTE — Telephone Encounter (Signed)
 Lab Results reviewed with pt as per Dr. Tonja Fray note. Entresto  sent to [employer].  Pt verbalized understanding and had no additional questions. Routed to PCP

## 2023-12-02 ENCOUNTER — Ambulatory Visit: Payer: Medicare Other | Admitting: Physician Assistant

## 2023-12-02 ENCOUNTER — Telehealth: Payer: Self-pay | Admitting: Cardiology

## 2023-12-02 NOTE — Telephone Encounter (Signed)
 Wife Ninette Basque) called to follow-up on test results and noted she cannot afford the medication patient has been prescribed.

## 2023-12-02 NOTE — Telephone Encounter (Signed)
 Spoke with pt's spouse. She stated that they could not afford the Entresto . She stated that they are dealing with a lot right now and wondered if there is anything else to try. Offered to try patient assistance program and she stated that would be fine, but how long would that last and what would he take if he did not qualify.

## 2023-12-03 DIAGNOSIS — H353131 Nonexudative age-related macular degeneration, bilateral, early dry stage: Secondary | ICD-10-CM | POA: Diagnosis not present

## 2023-12-03 DIAGNOSIS — H26492 Other secondary cataract, left eye: Secondary | ICD-10-CM | POA: Diagnosis not present

## 2023-12-08 ENCOUNTER — Ambulatory Visit: Payer: Medicare Other | Admitting: Physician Assistant

## 2023-12-10 DIAGNOSIS — Z0131 Encounter for examination of blood pressure with abnormal findings: Secondary | ICD-10-CM

## 2023-12-10 DIAGNOSIS — I10 Essential (primary) hypertension: Secondary | ICD-10-CM

## 2023-12-17 NOTE — Addendum Note (Signed)
Addended by: Heywood Bene on: 12/17/2023 05:11 PM   Modules accepted: Orders

## 2023-12-18 NOTE — Addendum Note (Signed)
 Addended by: Huntley Dec REDDY on: 12/18/2023 08:16 AM   Modules accepted: Orders

## 2023-12-22 ENCOUNTER — Telehealth (HOSPITAL_COMMUNITY): Payer: Self-pay | Admitting: *Deleted

## 2023-12-22 NOTE — Telephone Encounter (Signed)
 Patient given detailed instructions per Myocardial Perfusion Study Information Sheet for the test on 12/28/2023 at 8:00. Patient notified to arrive 15 minutes early and that it is imperative to arrive on time for appointment to keep from having the test rescheduled.  If you need to cancel or reschedule your appointment, please call the office within 24 hours of your appointment. . Patient verbalized understanding.Adam Bennett

## 2023-12-24 ENCOUNTER — Other Ambulatory Visit: Payer: Self-pay | Admitting: Physician Assistant

## 2023-12-24 DIAGNOSIS — M1A00X0 Idiopathic chronic gout, unspecified site, without tophus (tophi): Secondary | ICD-10-CM

## 2023-12-24 MED ORDER — ALLOPURINOL 100 MG PO TABS: 100.0000 mg | ORAL_TABLET | Freq: Every day | ORAL | 1 refills | Status: DC

## 2023-12-28 ENCOUNTER — Ambulatory Visit: Payer: Medicare Other | Attending: Cardiology

## 2023-12-28 ENCOUNTER — Ambulatory Visit: Payer: Medicare Other | Admitting: Physician Assistant

## 2023-12-28 DIAGNOSIS — I420 Dilated cardiomyopathy: Secondary | ICD-10-CM

## 2023-12-28 DIAGNOSIS — R0609 Other forms of dyspnea: Secondary | ICD-10-CM | POA: Diagnosis not present

## 2023-12-28 MED ORDER — TECHNETIUM TC 99M TETROFOSMIN IV KIT
10.4000 | PACK | Freq: Once | INTRAVENOUS | Status: AC | PRN
  Administered 2023-12-28: 10.4 via INTRAVENOUS

## 2023-12-28 MED ORDER — REGADENOSON 0.4 MG/5ML IV SOLN
0.4000 mg | Freq: Once | INTRAVENOUS | Status: AC
  Administered 2023-12-28: 0.4 mg via INTRAVENOUS

## 2023-12-28 MED ORDER — TECHNETIUM TC 99M TETROFOSMIN IV KIT
30.1000 | PACK | Freq: Once | INTRAVENOUS | Status: AC | PRN
  Administered 2023-12-28: 30.1 via INTRAVENOUS

## 2023-12-29 ENCOUNTER — Telehealth: Payer: Self-pay | Admitting: Cardiology

## 2023-12-29 LAB — MYOCARDIAL PERFUSION IMAGING
LV dias vol: 188 mL (ref 62–150)
LV sys vol: 112 mL
Nuc Stress EF: 40 %
Peak HR: 85 {beats}/min
Rest HR: 71 {beats}/min
Rest Nuclear Isotope Dose: 10.4 mCi
SDS: 3
SRS: 6
SSS: 9
Stress Nuclear Isotope Dose: 30.1 mCi
TID: 1.05

## 2023-12-29 NOTE — Telephone Encounter (Signed)
 Wife returning call to nurse for results

## 2023-12-29 NOTE — Telephone Encounter (Signed)
 Alda Ponder, the patient's wife informed of results.

## 2024-01-05 DIAGNOSIS — H25811 Combined forms of age-related cataract, right eye: Secondary | ICD-10-CM | POA: Diagnosis not present

## 2024-01-05 DIAGNOSIS — H2511 Age-related nuclear cataract, right eye: Secondary | ICD-10-CM | POA: Diagnosis not present

## 2024-01-05 DIAGNOSIS — H269 Unspecified cataract: Secondary | ICD-10-CM | POA: Diagnosis not present

## 2024-01-10 ENCOUNTER — Ambulatory Visit: Payer: Medicare Other | Admitting: Physician Assistant

## 2024-01-11 ENCOUNTER — Encounter: Payer: Self-pay | Admitting: Physician Assistant

## 2024-01-11 ENCOUNTER — Ambulatory Visit (INDEPENDENT_AMBULATORY_CARE_PROVIDER_SITE_OTHER): Payer: Medicare Other | Admitting: Physician Assistant

## 2024-01-11 VITALS — BP 134/62 | HR 69 | Temp 97.8°F | Resp 18 | Ht 75.0 in | Wt 221.4 lb

## 2024-01-11 DIAGNOSIS — M1A00X0 Idiopathic chronic gout, unspecified site, without tophus (tophi): Secondary | ICD-10-CM

## 2024-01-11 DIAGNOSIS — R350 Frequency of micturition: Secondary | ICD-10-CM

## 2024-01-11 DIAGNOSIS — R972 Elevated prostate specific antigen [PSA]: Secondary | ICD-10-CM

## 2024-01-11 DIAGNOSIS — N401 Enlarged prostate with lower urinary tract symptoms: Secondary | ICD-10-CM

## 2024-01-11 DIAGNOSIS — I10 Essential (primary) hypertension: Secondary | ICD-10-CM

## 2024-01-11 DIAGNOSIS — E782 Mixed hyperlipidemia: Secondary | ICD-10-CM | POA: Diagnosis not present

## 2024-01-11 NOTE — Progress Notes (Unsigned)
 Subjective:  Patient ID: Adam Bennett, male    DOB: [birth year]  Age: 83 y.o. MRN: [id number]  Chief Complaint  Patient presents with   Medical Management of Chronic Issues    HPI Pt in for follow up of hypertension     01/11/2024    3:02 PM 04/21/2023    1:24 PM 08/06/2022    2:30 PM 03/02/2022    9:54 AM 08/05/2021    9:00 AM  Depression screen PHQ 2/9  Decreased Interest 0 0 0 0 0  Down, Depressed, Hopeless 0 0 0 0 0  PHQ - 2 Score 0 0 0 0 0  Altered sleeping   0    Tired, decreased energy   0    Change in appetite   0    Feeling bad or failure about yourself    0    Trouble concentrating   0    Moving slowly or fidgety/restless   0    Suicidal thoughts   0    PHQ-9 Score   0    Difficult doing work/chores   Not difficult at all          03/02/2022    9:53 AM 08/06/2022    2:30 PM 03/04/2023    9:45 AM 04/21/2023    1:24 PM 01/11/2024    3:02 PM  Fall Risk  Falls in the past year? 1 1 0 1 0  Was there an injury with Fall? 0 0 0 0 0  Fall Risk Category Calculator 2 2 0 1 0  Fall Risk Category (Retired) Moderate Moderate     (RETIRED) Patient Fall Risk Level Moderate fall risk Moderate fall risk     Patient at Risk for Falls Due to  Impaired balance/gait No Fall Risks History of fall(s);No Fall Risks No Fall Risks  Fall risk Follow up  Falls evaluation completed;Falls prevention discussed Falls evaluation completed Follow up appointment Falls evaluation completed     ROS CONSTITUTIONAL: Negative for chills, fatigue, fever, unintentional weight gain and unintentional weight loss.  E/N/T: Negative for ear pain, nasal congestion and sore throat.  CARDIOVASCULAR: Negative for chest pain, dizziness, palpitations and pedal edema.  RESPIRATORY: Negative for recent cough and dyspnea.  GASTROINTESTINAL: Negative for abdominal pain, acid reflux symptoms, constipation, diarrhea, nausea and vomiting.  MSK: Negative for arthralgias and myalgias.  INTEGUMENTARY: Negative for rash.   NEUROLOGICAL: Negative for dizziness and headaches.  PSYCHIATRIC: Negative for sleep disturbance and to question depression screen.  Negative for depression, negative for anhedonia.    Current Outpatient Medications:    acetaminophen (TYLENOL) 650 MG CR tablet, Take 650 mg by mouth as needed for pain., Disp: , Rfl:    allopurinol (ZYLOPRIM) 100 MG tablet, Take 1 tablet (100 mg total) by mouth daily., Disp: 90 tablet, Rfl: 1   aspirin EC 81 MG tablet, Take 81 mg by mouth daily., Disp: , Rfl:    diltiazem (TIAZAC) 360 MG 24 hr capsule, Take 1 capsule (360 mg total) by mouth daily., Disp: 90 capsule, Rfl: 1   ezetimibe (ZETIA) 10 MG tablet, Take 1 tablet (10 mg total) by mouth daily., Disp: 90 tablet, Rfl: 0   fluticasone (FLONASE) 50 MCG/ACT nasal spray, Place 2 sprays into both nostrils daily., Disp: 16 g, Rfl: 6   loratadine (CLARITIN) 10 MG tablet, Take 10 mg by mouth as needed for allergies., Disp: , Rfl:    meloxicam (MOBIC) 15 MG tablet, Take 1 tablet (15 mg total) by  mouth daily., Disp: 90 tablet, Rfl: 1   metoprolol succinate (TOPROL-XL) 50 MG 24 hr tablet, Take 1 tablet (50 mg total) by mouth daily., Disp: 90 tablet, Rfl: 2   Multiple Vitamin (MULTIVITAMIN WITH MINERALS) TABS tablet, Take 1 tablet by mouth daily. Unknown strength, Disp: , Rfl:    pantoprazole (PROTONIX) 40 MG tablet, Take 1 tablet (40 mg total) by mouth daily., Disp: 90 tablet, Rfl: 1   polyethylene glycol (MIRALAX / GLYCOLAX) packet, Take 17 g by mouth daily as needed for mild constipation or moderate constipation., Disp: , Rfl:    sacubitril-valsartan (ENTRESTO) 24-26 MG, Take 1 tablet by mouth 2 (two) times daily., Disp: 60 tablet, Rfl: 6   rosuvastatin (CRESTOR) 10 MG tablet, Take 1 tablet (10 mg total) by mouth daily., Disp: 90 tablet, Rfl: 3  Past Medical History:  Diagnosis Date   A-fib (HCC)    Anal bleeding 02/03/2021   At increased risk of exposure to COVID-19 virus 10/21/2020   Atrial fibrillation (HCC)  06/25/2014   Bilateral carpal tunnel syndrome 02/22/2018   Bronchial pneumonia 10/21/2020   Cervical radiculopathy at C7 02/22/2018   Left   Chronic sinus complaints    Dilated cardiomyopathy (HCC)    Essential hypertension, benign 06/25/2014   GERD (gastroesophageal reflux disease)    Hematochezia 02/05/2020   Hypertension    Hypotension, unspecified 06/25/2014   NSVT (nonsustained ventricular tachycardia) (HCC)    Obstructive sleep apnea 05/08/2015   Pulmonary hypertension (HCC) 05/08/2015   Special screening examination for viral disease 02/05/2020   Spondylolisthesis of lumbar region 06/22/2014   Syncope 06/25/2014   Ulnar neuropathy at elbow 02/22/2018   Bilateral   Objective:  PHYSICAL EXAM:   BP 120/70 (BP Location: Left Arm, Patient Position: Sitting, Cuff Size: Large)   Pulse 69   Temp 97.8 F (36.6 C) (Temporal)   Resp 18   Ht 6\' 3"  (1.905 m)   Wt 221 lb 6.4 oz (100.4 kg)   SpO2 96%   BMI 27.67 kg/m  {Vitals History (Optional):23777}  GEN: Well nourished, well developed, in no acute distress  HEENT: normal external ears and nose - normal external auditory canals and TMS - hearing grossly normal - normal nasal mucosa and septum - Lips, Teeth and Gums - normal  Oropharynx - normal mucosa, palate, and posterior pharynx Neck: no JVD or masses - no thyromegaly Cardiac: RRR; no murmurs, rubs, or gallops,no edema - no significant varicosities Respiratory:  normal respiratory rate and pattern with no distress - normal breath sounds with no rales, rhonchi, wheezes or rubs GI: normal bowel sounds, no masses or tenderness MS: no deformity or atrophy  Skin: warm and dry, no rash  Neuro:  Alert and Oriented x 3, Strength and sensation are intact - CN II-Xii grossly intact Psych: euthymic mood, appropriate affect and demeanor  Assessment & Plan:  *** There are no diagnoses linked to this encounter.   Follow-up: Return in about 3 months (around 04/12/2024) for chronic fasting  follow-up.  An After Visit Summary was printed and given to the patient.  Jettie Pagan [facility] [phone]

## 2024-01-13 MED ORDER — ROSUVASTATIN CALCIUM 40 MG PO TABS
40.0000 mg | ORAL_TABLET | Freq: Every day | ORAL | 1 refills | Status: DC
Start: 2024-01-13 — End: 2024-04-14

## 2024-01-13 NOTE — Progress Notes (Signed)
 Subjective:  Patient ID: Adam Bennett, male    DOB: August 10, 1941  Age: 83 y.o. MRN: [id number]  Chief Complaint  Patient presents with   Medical Management of Chronic Issues    HPI  Pt presents for follow up of hypertension. The patient is tolerating the medication well without side effects. Compliance with treatment has been good; including taking medication as directed , maintains a healthy diet and regular exercise regimen , and following up as directed.--  BP at home ranging 130-160s/70s-90s.  In office bp has been good - today at 134/62.  He has seen cardiology recently as well and had 24 hour BP monitor - no changes in meds made  Denies chest pain or dyspnea Currently on cardizem CD 360, Toprol XL 50 and zestril 40mg  qd Pt did have a nuclear stress test and it showed that his stress test showed his ejection fraction had decreased to 40 % from the last echo earlier in the year that was at 50-55% - cardiology to decide if will need to repeat echo Cardiology had placed patient on entresto but he is not able to afford medication Recommend that he make follow up appt with them to discuss  Pt with history of gout - stable on daily allopurinol -uses meloxicam for intermittent breakthrough pain  Pt with history of GERD - symptoms controlled with protonix 40mg    Mixed hyperlipidemia  Pt presents with hyperlipidemia. Compliance with treatment has been good - The patient is compliant with medications, maintains a low cholesterol diet , follows up as directed , and maintains an exercise regimen . The patient denies experiencing any hypercholesterolemia related symptoms. Pt currently on crestor 10mg  qd - his last lipid panel showed elevated LDL and chol ---- another provider had thought pt was on crestor 40mg  and added in zetia - however per pt's wife he is actually not taking zetia and we will adjust crestor instead  Last labwork also showed elevated PSA - pt states he does have issues at times with  BPH symptoms and given this issue will plan to refer to urology for both reasons  Current Outpatient Medications on File Prior to Visit  Medication Sig Dispense Refill   acetaminophen (TYLENOL) 650 MG CR tablet Take 650 mg by mouth as needed for pain.     allopurinol (ZYLOPRIM) 100 MG tablet Take 1 tablet (100 mg total) by mouth daily. 90 tablet 1   aspirin EC 81 MG tablet Take 81 mg by mouth daily.     diltiazem (TIAZAC) 360 MG 24 hr capsule Take 1 capsule (360 mg total) by mouth daily. 90 capsule 1   fluticasone (FLONASE) 50 MCG/ACT nasal spray Place 2 sprays into both nostrils daily. 16 g 6   lisinopril (ZESTRIL) 40 MG tablet Take 20 mg by mouth daily.     loratadine (CLARITIN) 10 MG tablet Take 10 mg by mouth as needed for allergies.     meloxicam (MOBIC) 15 MG tablet Take 1 tablet (15 mg total) by mouth daily. 90 tablet 1   metoprolol succinate (TOPROL-XL) 50 MG 24 hr tablet Take 1 tablet (50 mg total) by mouth daily. 90 tablet 2   Multiple Vitamin (MULTIVITAMIN WITH MINERALS) TABS tablet Take 1 tablet by mouth daily. Unknown strength     Olopatadine HCl (PATADAY OP) Apply 1 drop to eye daily. 1 drop in each eye daily     pantoprazole (PROTONIX) 40 MG tablet Take 1 tablet (40 mg total) by mouth daily. 90 tablet  1   polyethylene glycol (MIRALAX / GLYCOLAX) packet Take 17 g by mouth daily as needed for mild constipation or moderate constipation.     No current facility-administered medications on file prior to visit.   Past Medical History:  Diagnosis Date   A-fib [facility])    Anal bleeding 02/03/2021   At increased risk of exposure to COVID-19 virus 10/21/2020   Atrial fibrillation (HCC) 06/25/2014   Bilateral carpal tunnel syndrome 02/22/2018   Bronchial pneumonia 10/21/2020   Cervical radiculopathy at C7 02/22/2018   Left   Chronic sinus complaints    Dilated cardiomyopathy (HCC)    Essential hypertension, benign 06/25/2014   GERD (gastroesophageal reflux disease)    Hematochezia  02/05/2020   Hypertension    Hypotension, unspecified 06/25/2014   NSVT (nonsustained ventricular tachycardia) (HCC)    Obstructive sleep apnea 05/08/2015   Pulmonary hypertension (HCC) 05/08/2015   Special screening examination for viral disease 02/05/2020   Spondylolisthesis of lumbar region 06/22/2014   Syncope 06/25/2014   Ulnar neuropathy at elbow 02/22/2018   Bilateral   Past Surgical History:  Procedure Laterality Date   BACK SURGERY     CARDIAC CATHETERIZATION     DONE IN [place], 29YRS AGO   CHOLECYSTECTOMY     FOOT SURGERY     HEMORRHOID SURGERY     HERNIA REPAIR     JOINT REPLACEMENT     BILATER KNEE REPLACEMENTS   TRANSURETHRAL RESECTION OF PROSTATE      Family History  Problem Relation Age of Onset   Congestive Heart Failure Father    CAD Father    Congestive Heart Failure Brother    CAD Brother    Hypertension Other    Diabetes Other    Colon cancer Mother    Social History   Socioeconomic History   Marital status: Married    Spouse name: Not on file   Number of children: Not on file   Years of education: Not on file   Highest education level: Not on file  Occupational History   Not on file  Tobacco Use   Smoking status: Former    Current packs/day: 0.00    Types: Cigarettes    Quit date: 09/20/2007    Years since quitting: 16.3   Smokeless tobacco: Never  Vaping Use   Vaping status: Never Used  Substance and Sexual Activity   Alcohol use: Yes    Comment: occasional/rare   Drug use: No   Sexual activity: Not Currently  Other Topics Concern   Not on file  Social History Narrative   Not on file   Social Drivers of Health   Financial Resource Strain: Low Risk  (04/21/2023)   Overall Financial Resource Strain (CARDIA)    Difficulty of Paying Living Expenses: Not hard at all  Food Insecurity: No Food Insecurity (04/21/2023)   Hunger Vital Sign    Worried About Running Out of Food in the Last Year: Never true    Ran Out of Food in the Last Year:  Never true  Transportation Needs: No Transportation Needs (04/21/2023)   PRAPARE - Administrator, Civil Service (Medical): No    Lack of Transportation (Non-Medical): No  Physical Activity: Sufficiently Active (04/21/2023)   Exercise Vital Sign    Days of Exercise per Week: 5 days    Minutes of Exercise per Session: 40 min  Stress: No Stress Concern Present (04/21/2023)   [employer] of Occupational Health - Occupational Stress Questionnaire  Feeling of Stress : Not at all  Social Connections: Moderately Integrated (04/21/2023)   Social Connection and Isolation Panel [NHANES]    Frequency of Communication with Friends and Family: More than three times a week    Frequency of Social Gatherings with Friends and Family: More than three times a week    Attends Religious Services: More than 4 times per year    Active Member of [employer] or Organizations: No    Attends Banker Meetings: Never    Marital Status: Married   CONSTITUTIONAL: Negative for chills, fatigue, fever, unintentional weight gain and unintentional weight loss.  E/N/T: Negative for ear pain, nasal congestion and sore throat.  CARDIOVASCULAR: Negative for chest pain, dizziness, palpitations and pedal edema.  RESPIRATORY: Negative for recent cough and dyspnea.  GASTROINTESTINAL: Negative for abdominal pain, acid reflux symptoms, constipation, diarrhea, nausea and vomiting. GU - see HPI  MSK: Negative for arthralgias and myalgias.  INTEGUMENTARY: Negative for rash.  NEUROLOGICAL: Negative for dizziness and headaches.  PSYCHIATRIC: Negative for sleep disturbance and to question depression screen.  Negative for depression, negative for anhedonia.        Objective:   PHYSICAL EXAM:   VS: BP 134/62   Pulse 69   Temp 97.8 F (36.6 C) (Temporal)   Resp 18   Ht 6\' 3"  (1.905 m)   Wt 221 lb 6.4 oz (100.4 kg)   SpO2 96%   BMI 27.67 kg/m   GEN: Well nourished, well developed, in no acute  distress   Cardiac: RRR; no murmurs, rubs, or gallops,no edema -  Respiratory:  normal respiratory rate and pattern with no distress - normal breath sounds with no rales, rhonchi, wheezes or rubs  MS: no deformity or atrophy  Skin: warm and dry, no rash  Neuro:  Alert and Oriented x 3, - CN II-Xii grossly intact Psych: euthymic mood, appropriate affect and demeanor     10/12/2023   11:07 AM  MMSE - Mini Mental State Exam  Orientation to time 5  Orientation to Place 5  Registration 3  Attention/ Calculation 4  Recall 3  Language- name 2 objects 2  Language- repeat 1  Language- follow 3 step command 3  Language- read & follow direction 1  Write a sentence 1  Copy design 1  Total score 29     Lab Results  Component Value Date   WBC 4.9 10/12/2023   HGB 14.4 10/12/2023   HCT 43.0 10/12/2023   PLT 255 10/12/2023   GLUCOSE 105 (H) 11/24/2023   CHOL 212 (H) 10/12/2023   TRIG 205 (H) 10/12/2023   HDL 41 10/12/2023   LDLDIRECT 93 11/24/2023   LDLCALC 134 (H) 10/12/2023   ALT 21 10/12/2023   AST 18 10/12/2023   NA 140 11/24/2023   K 4.2 11/24/2023   CL 105 11/24/2023   CREATININE 0.88 11/24/2023   BUN 15 11/24/2023   CO2 21 11/24/2023   TSH 1.040 10/12/2023   INR 0.93 07/02/2010   HGBA1C 6.0 (H) 10/12/2023      Assessment & Plan:   Problem List Items Addressed This Visit       Cardiovascular and Mediastinum   Essential hypertension, benign - Primary   Relevant Orders   Labwork pending Recommend to monitor bp and continue current meds   Low salt diet           Digestive   Gastroesophageal reflux disease without esophagitis Continue current meds     Other  Idiopathic chronic gout without tophus   Relevant Orders   Uric Acid  Continue allopurinol   Mixed hyperlipidemia   Relevant Orders   Lipid panel Watch diet Increase crestor to 40mg  qd   Chronic bilateral low back pain without sciatica l meloxicam 15mg  qd  Prediabetes Watch diet Hgb  A1c pending  Elevated PSA and  BPH Refer to urology     .  Meds ordered this encounter  Medications   rosuvastatin (CRESTOR) 40 MG tablet    Sig: Take 1 tablet (40 mg total) by mouth daily.    Dispense:  90 tablet    Refill:  1    Supervising Provider:   Blane Ohara [phone]    Orders Placed This Encounter  Procedures   Ambulatory referral to Urology     Follow-up: Return in about 3 months (around 04/12/2024) for chronic fasting follow-up.  An After Visit Summary was printed and given to the patient.  Jettie Pagan [facility] [phone]

## 2024-01-22 DIAGNOSIS — G4733 Obstructive sleep apnea (adult) (pediatric): Secondary | ICD-10-CM | POA: Diagnosis not present

## 2024-01-25 ENCOUNTER — Other Ambulatory Visit: Payer: Self-pay | Admitting: Physician Assistant

## 2024-01-25 DIAGNOSIS — E782 Mixed hyperlipidemia: Secondary | ICD-10-CM

## 2024-02-08 DIAGNOSIS — R251 Tremor, unspecified: Secondary | ICD-10-CM | POA: Diagnosis not present

## 2024-02-16 DIAGNOSIS — Z961 Presence of intraocular lens: Secondary | ICD-10-CM | POA: Diagnosis not present

## 2024-02-23 ENCOUNTER — Encounter: Payer: Self-pay | Admitting: Cardiology

## 2024-02-23 ENCOUNTER — Ambulatory Visit: Payer: Medicare Other | Attending: Cardiology | Admitting: Cardiology

## 2024-02-23 VITALS — BP 134/76 | HR 68 | Ht 75.0 in | Wt 225.0 lb

## 2024-02-23 DIAGNOSIS — I420 Dilated cardiomyopathy: Secondary | ICD-10-CM | POA: Diagnosis not present

## 2024-02-23 DIAGNOSIS — G4733 Obstructive sleep apnea (adult) (pediatric): Secondary | ICD-10-CM | POA: Diagnosis not present

## 2024-02-23 DIAGNOSIS — I480 Paroxysmal atrial fibrillation: Secondary | ICD-10-CM

## 2024-02-23 DIAGNOSIS — R251 Tremor, unspecified: Secondary | ICD-10-CM

## 2024-02-23 DIAGNOSIS — I272 Pulmonary hypertension, unspecified: Secondary | ICD-10-CM

## 2024-02-23 MED ORDER — POTASSIUM CHLORIDE ER 10 MEQ PO TBCR: EXTENDED_RELEASE_TABLET | ORAL | 0 refills | Status: DC

## 2024-02-23 MED ORDER — FUROSEMIDE 20 MG PO TABS
ORAL_TABLET | ORAL | 3 refills | Status: AC
Start: 2024-02-23 — End: ?

## 2024-02-23 NOTE — Patient Instructions (Signed)
 Medication Instructions:  Your physician has recommended you make the following change in your medication:   Take 40 mg (2 tablets) Lasix for 3 days then decrease to 20 mg (1 tablet) daily.  Take 10 mEq potassium while for 3 days.  *If you need a refill on your cardiac medications before your next appointment, please call your pharmacy*   Lab Work: Your physician recommends that you return for lab work in: 1 week for BMP You can come Monday through Friday 8:30 am to 12:00 pm and 1:15 to 4:30. You do not need to make an appointment as the order has already been placed.   If you have labs (blood work) drawn today and your tests are completely normal, you will receive your results only by: MyChart Message (if you have MyChart) OR A paper copy in the mail If you have any lab test that is abnormal or we need to change your treatment, we will call you to review the results.   Testing/Procedures: None ordered   Follow-Up: At [facility], you and your health needs are our priority.  As part of our continuing mission to provide you with exceptional heart care, we have created designated Provider Care Teams.  These Care Teams include your primary Cardiologist (physician) and Advanced Practice Providers (APPs -  Physician Assistants and Nurse Practitioners) who all work together to provide you with the care you need, when you need it.  We recommend signing up for the patient portal called "MyChart".  Sign up information is provided on this After Visit Summary.  MyChart is used to connect with patients for Virtual Visits (Telemedicine).  Patients are able to view lab/test results, encounter notes, upcoming appointments, etc.  Non-urgent messages can be sent to your provider as well.   To learn more about what you can do with MyChart, go to [email or web address].    Your next appointment:   6 week(s)  The format for your next appointment:   In Person  Provider:   Ralene Burger,  MD    Other Instructions none  Important Information About Sugar

## 2024-02-24 DIAGNOSIS — H353212 Exudative age-related macular degeneration, right eye, with inactive choroidal neovascularization: Secondary | ICD-10-CM | POA: Diagnosis not present

## 2024-02-24 NOTE — Progress Notes (Signed)
 Cardiology Office Note:    Date:  02/24/2024   ID:  Adam Bennett, DOB 04-23-1941, MRN [id number]  PCP:  Adam Drain, PA-C  Cardiologist:  Ralene Burger, MD    Referring MD: Adam Drain, PA-C   Chief Complaint  Patient presents with   Follow-up   Results    History of Present Illness:    Adam Bennett is a 83 y.o. male past medical history significant for essential hypertension somewhat difficult to control, atrial fibrillation 1 documented episode he does not want to be anticoagulated, COPD, obstructive sleep apnea, cardiomyopathy.  Comes today to months for follow-up he did notice swelling of lower extremities.  Especially at evening time.  He denies have any palpitations no chest pain tightness squeezing pressure burning chest overall except for distended abdomen slightly and swollen legs he is doing fine.  Stress test being done analyzed with the patient negative  Past Medical History:  Diagnosis Date   A-fib (HCC)    Anal bleeding 02/03/2021   At increased risk of exposure to COVID-19 virus 10/21/2020   Atrial fibrillation (HCC) 06/25/2014   Bilateral carpal tunnel syndrome 02/22/2018   Bronchial pneumonia 10/21/2020   Cervical radiculopathy at C7 02/22/2018   Left   Chronic sinus complaints    Dilated cardiomyopathy (HCC)    Essential hypertension, benign 06/25/2014   GERD (gastroesophageal reflux disease)    Hematochezia 02/05/2020   Hypertension    Hypotension, unspecified 06/25/2014   NSVT (nonsustained ventricular tachycardia) (HCC)    Obstructive sleep apnea 05/08/2015   Pulmonary hypertension (HCC) 05/08/2015   Special screening examination for viral disease 02/05/2020   Spondylolisthesis of lumbar region 06/22/2014   Syncope 06/25/2014   Ulnar neuropathy at elbow 02/22/2018   Bilateral    Past Surgical History:  Procedure Laterality Date   BACK SURGERY     CARDIAC CATHETERIZATION     DONE IN [place], 9YRS AGO   CHOLECYSTECTOMY     FOOT SURGERY     HEMORRHOID  SURGERY     HERNIA REPAIR     JOINT REPLACEMENT     BILATER KNEE REPLACEMENTS   TRANSURETHRAL RESECTION OF PROSTATE      Current Medications: Current Meds  Medication Sig   acetaminophen  (TYLENOL ) 650 MG CR tablet Take 650 mg by mouth as needed for pain.   allopurinol  (ZYLOPRIM ) 100 MG tablet Take 1 tablet (100 mg total) by mouth daily.   aspirin  EC 81 MG tablet Take 81 mg by mouth daily.   diltiazem  (TIAZAC ) 360 MG 24 hr capsule Take 1 capsule (360 mg total) by mouth daily.   ezetimibe  (ZETIA ) 10 MG tablet TAKE 1 TABLET(10 MG) BY MOUTH DAILY (Patient taking differently: Take 10 mg by mouth daily.)   fluticasone  (FLONASE ) 50 MCG/ACT nasal spray Place 2 sprays into both nostrils daily.   furosemide  (LASIX ) 20 MG tablet Take 40 mg (2 tablets) daily for 3 days then take 20 mg (1 tablet) daily   lisinopril  (ZESTRIL ) 40 MG tablet Take 20 mg by mouth daily.   loratadine (CLARITIN) 10 MG tablet Take 10 mg by mouth as needed for allergies.   meloxicam  (MOBIC ) 15 MG tablet Take 1 tablet (15 mg total) by mouth daily.   metoprolol  succinate (TOPROL -XL) 50 MG 24 hr tablet Take 1 tablet (50 mg total) by mouth daily.   Multiple Vitamins-Minerals (ONE A DAY MEN 50 PLUS PO) Take 1 tablet by mouth daily.   Multiple Vitamins-Minerals (PRESERVISION AREDS 2 PO) Take 1 tablet  by mouth daily.   Olopatadine HCl (PATADAY OP) Apply 1 drop to eye daily. 1 drop in each eye daily   pantoprazole  (PROTONIX ) 40 MG tablet Take 1 tablet (40 mg total) by mouth daily.   polyethylene glycol (MIRALAX / GLYCOLAX) packet Take 17 g by mouth daily as needed for mild constipation or moderate constipation.   potassium chloride  (KLOR-CON ) 10 MEQ tablet Take 10 meq (1 tablet) while taking 40 mg Lasix    rosuvastatin  (CRESTOR ) 40 MG tablet Take 1 tablet (40 mg total) by mouth daily.   [DISCONTINUED] HYDROcodone  bit-homatropine (HYDROMET) 5-1.5 MG/5ML syrup Take 5 mLs by mouth every 4 (four) hours as needed for cough.   [DISCONTINUED]  Multiple Vitamin (MULTIVITAMIN WITH MINERALS) TABS tablet Take 1 tablet by mouth daily. Unknown strength   [DISCONTINUED] primidone (MYSOLINE) 50 MG tablet Take 1-2 tablets by mouth at bedtime.     Allergies:   Patient has no known allergies.   Social History   Socioeconomic History   Marital status: Married    Spouse name: Not on file   Number of children: Not on file   Years of education: Not on file   Highest education level: Not on file  Occupational History   Not on file  Tobacco Use   Smoking status: Former    Current packs/day: 0.00    Types: Cigarettes    Quit date: 09/20/2007    Years since quitting: 16.4   Smokeless tobacco: Never  Vaping Use   Vaping status: Never Used  Substance and Sexual Activity   Alcohol use: Yes    Comment: occasional/rare   Drug use: No   Sexual activity: Not Currently  Other Topics Concern   Not on file  Social History Narrative   Not on file   Social Drivers of Health   Financial Resource Strain: Low Risk  (04/21/2023)   Overall Financial Resource Strain (CARDIA)    Difficulty of Paying Living Expenses: Not hard at all  Food Insecurity: No Food Insecurity (04/21/2023)   Hunger Vital Sign    Worried About Running Out of Food in the Last Year: Never true    Ran Out of Food in the Last Year: Never true  Transportation Needs: No Transportation Needs (04/21/2023)   PRAPARE - Administrator, Civil Service (Medical): No    Lack of Transportation (Non-Medical): No  Physical Activity: Sufficiently Active (04/21/2023)   Exercise Vital Sign    Days of Exercise per Week: 5 days    Minutes of Exercise per Session: 40 min  Stress: No Stress Concern Present (04/21/2023)   [employer] of Occupational Health - Occupational Stress Questionnaire    Feeling of Stress : Not at all  Social Connections: Moderately Integrated (04/21/2023)   Social Connection and Isolation Panel [NHANES]    Frequency of Communication with Friends and  Family: More than three times a week    Frequency of Social Gatherings with Friends and Family: More than three times a week    Attends Religious Services: More than 4 times per year    Active Member of [employer] or Organizations: No    Attends Engineer, structural: Never    Marital Status: Married     Family History: The patient's family history includes CAD in his brother and father; Colon cancer in his mother; Congestive Heart Failure in his brother and father; Diabetes in an other family member; Hypertension in an other family member. ROS:   Please see the history of  present illness.    All 14 point review of systems negative except as described per history of present illness  EKGs/Labs/Other Studies Reviewed:         Recent Labs: 10/12/2023: ALT 21; Hemoglobin 14.4; Platelets 255; TSH 1.040 11/24/2023: BUN 15; Creatinine, Ser 0.88; NT-Pro BNP 363; Potassium 4.2; Sodium 140  Recent Lipid Panel    Component Value Date/Time   CHOL 212 (H) 10/12/2023 1114   TRIG 205 (H) 10/12/2023 1114   HDL 41 10/12/2023 1114   CHOLHDL 5.2 (H) 10/12/2023 1114   LDLCALC 134 (H) 10/12/2023 1114   LDLDIRECT 93 11/24/2023 1607    Physical Exam:    VS:  BP 134/76 (BP Location: Right Arm, Patient Position: Sitting)   Pulse 68   Ht 6\' 3"  (1.905 m)   Wt 225 lb (102.1 kg)   SpO2 96%   BMI 28.12 kg/m     Wt Readings from Last 3 Encounters:  02/23/24 225 lb (102.1 kg)  01/11/24 221 lb 6.4 oz (100.4 kg)  12/28/23 215 lb (97.5 kg)     GEN:  Well nourished, well developed in no acute distress HEENT: Normal NECK: No JVD; No carotid bruits LYMPHATICS: No lymphadenopathy CARDIAC: RRR, no murmurs, no rubs, no gallops RESPIRATORY:  Clear to auscultation without rales, wheezing or rhonchi  ABDOMEN: Soft, non-tender, non-distended MUSCULOSKELETAL:  No edema; No deformity  SKIN: Warm and dry LOWER EXTREMITIES: 1+ swelling NEUROLOGIC:  Alert and oriented x 3 PSYCHIATRIC:  Normal affect    ASSESSMENT:    1. Dilated cardiomyopathy (HCC)   2. Paroxysmal atrial fibrillation (HCC)   3. Pulmonary hypertension (HCC)   4. Obstructive sleep apnea   5. Tremor    PLAN:    In order of problems listed above:  History of dilated cardiomyopathy.  Will check Chem-7 proBNP to see if we can augment his medical therapy.  I will give him 40 mg of Lasix  for 3 days and after that 20 mg daily.  Will see him back quite promptly Paroxysmal atrial fibrillation denies having palpitations, off anticoagulation as per his wishes so far likely only 1 episode. Obstructive sleep apnea followed by internal medicine team Pulmonary hypertension last echocardiogram with no confirmed. Tremor was put on medication feeling much better   Medication Adjustments/Labs and Tests Ordered: Current medicines are reviewed at length with the patient today.  Concerns regarding medicines are outlined above.  Orders Placed This Encounter  Procedures   Basic metabolic panel with GFR   Medication changes:  Meds ordered this encounter  Medications   furosemide  (LASIX ) 20 MG tablet    Sig: Take 40 mg (2 tablets) daily for 3 days then take 20 mg (1 tablet) daily    Dispense:  93 tablet    Refill:  3   potassium chloride  (KLOR-CON ) 10 MEQ tablet    Sig: Take 10 meq (1 tablet) while taking 40 mg Lasix     Dispense:  3 tablet    Refill:  0    Signed, Manfred Seed, MD, [facility] 02/24/2024 7:59 AM    [facility]

## 2024-03-06 DIAGNOSIS — I420 Dilated cardiomyopathy: Secondary | ICD-10-CM | POA: Diagnosis not present

## 2024-03-07 LAB — BASIC METABOLIC PANEL WITH GFR
BUN/Creatinine Ratio: 13 (ref 10–24)
BUN: 10 mg/dL (ref 8–27)
CO2: 23 mmol/L (ref 20–29)
Calcium: 9.1 mg/dL (ref 8.6–10.2)
Chloride: 99 mmol/L (ref 96–106)
Creatinine, Ser: 0.76 mg/dL (ref 0.76–1.27)
Glucose: 126 mg/dL — ABNORMAL HIGH (ref 70–99)
Potassium: 4 mmol/L (ref 3.5–5.2)
Sodium: 139 mmol/L (ref 134–144)
eGFR: 91 mL/min/{1.73_m2} (ref >59)

## 2024-03-10 ENCOUNTER — Ambulatory Visit (HOSPITAL_BASED_OUTPATIENT_CLINIC_OR_DEPARTMENT_OTHER)
Admission: RE | Admit: 2024-03-10 | Discharge: 2024-03-10 | Disposition: A | Discharge status: Home / Self Care | Source: Ambulatory Visit | Attending: Family Medicine | Admitting: Family Medicine

## 2024-03-10 ENCOUNTER — Ambulatory Visit (HOSPITAL_BASED_OUTPATIENT_CLINIC_OR_DEPARTMENT_OTHER): Admitting: Radiology

## 2024-03-10 ENCOUNTER — Encounter (HOSPITAL_BASED_OUTPATIENT_CLINIC_OR_DEPARTMENT_OTHER): Payer: Self-pay

## 2024-03-10 VITALS — BP 141/78 | HR 68 | Temp 97.8°F | Resp 18

## 2024-03-10 DIAGNOSIS — M7989 Other specified soft tissue disorders: Secondary | ICD-10-CM

## 2024-03-10 DIAGNOSIS — M79661 Pain in right lower leg: Secondary | ICD-10-CM

## 2024-03-10 DIAGNOSIS — Z96651 Presence of right artificial knee joint: Secondary | ICD-10-CM | POA: Diagnosis not present

## 2024-03-10 DIAGNOSIS — R609 Edema, unspecified: Secondary | ICD-10-CM | POA: Diagnosis not present

## 2024-03-10 DIAGNOSIS — I872 Venous insufficiency (chronic) (peripheral): Secondary | ICD-10-CM | POA: Diagnosis not present

## 2024-03-10 DIAGNOSIS — Z471 Aftercare following joint replacement surgery: Secondary | ICD-10-CM | POA: Diagnosis not present

## 2024-03-10 DIAGNOSIS — S80811A Abrasion, right lower leg, initial encounter: Secondary | ICD-10-CM

## 2024-03-10 MED ORDER — MUPIROCIN 2 % EX OINT: 1.0000 | TOPICAL_OINTMENT | Freq: Two times a day (BID) | CUTANEOUS | 0 refills | Status: DC

## 2024-03-10 MED ORDER — CEPHALEXIN 500 MG PO CAPS
500.0000 mg | ORAL_CAPSULE | Freq: Four times a day (QID) | ORAL | 0 refills | Status: AC
Start: 2024-03-10 — End: 2024-03-17

## 2024-03-10 NOTE — ED Triage Notes (Signed)
 Pt was walking in his garage yesterday evening and he kicked a plastic vase container with his right ankle. Pt c/o pain he has taken OTC tylenol  with no relief. Pt states the pain is as bad as when he has a gout flare up.

## 2024-03-10 NOTE — ED Provider Notes (Signed)
 [facility]    CSN: [id number] Arrival date & time: 03/10/24  1014      History   Chief Complaint Chief Complaint  Patient presents with   Ankle Pain    Hit ankle on pot last night - Entered by patient    HPI Adam Bennett is a 83 y.o. male.   Here with his wife.  Patient has chronic lymphedema of his lower legs and is on a fluid pill for this.  He did not take his fluid pill today because he was coming here.  He was in his garage last night and he accidentally knocked into plastic planter with his right lower leg, hit it hard.  During the night he just had significant right lower leg pain and spasm.  He says it is as bad as the pain when he has a gout flareup.  It is not in the ankle it is in the lower leg near the ankle laterally.  Over-the-counter Tylenol  has not helped much at all.   Ankle Pain Associated symptoms: no back pain and no fever     Past Medical History:  Diagnosis Date   A-fib (HCC)    Anal bleeding 02/03/2021   At increased risk of exposure to COVID-19 virus 10/21/2020   Atrial fibrillation (HCC) 06/25/2014   Bilateral carpal tunnel syndrome 02/22/2018   Bronchial pneumonia 10/21/2020   Cervical radiculopathy at C7 02/22/2018   Left   Chronic sinus complaints    Dilated cardiomyopathy (HCC)    Essential hypertension, benign 06/25/2014   GERD (gastroesophageal reflux disease)    Hematochezia 02/05/2020   Hypertension    Hypotension, unspecified 06/25/2014   NSVT (nonsustained ventricular tachycardia) (HCC)    Obstructive sleep apnea 05/08/2015   Pulmonary hypertension (HCC) 05/08/2015   Special screening examination for viral disease 02/05/2020   Spondylolisthesis of lumbar region 06/22/2014   Syncope 06/25/2014   Ulnar neuropathy at elbow 02/22/2018   Bilateral    Patient Active Problem List   Diagnosis Date Noted   Exertional dyspnea 11/04/2023   Need for vaccination for pneumococcus 11/04/2023   Abnormal EKG 11/04/2023   Prediabetes 10/12/2023    Memory change 10/12/2023   Enlarged prostate 10/12/2023   Chronic bilateral low back pain without sciatica 10/12/2023   Tremor of both hands 10/12/2023   Acute hemorrhagic cystitis 05/18/2023   Acute upper respiratory infection 01/20/2023   Abrasion of face 06/22/2022   Laceration of left knee 06/22/2022   Injury of face 06/22/2022   Idiopathic chronic gout without tophus 05/26/2022   Mixed hyperlipidemia 05/26/2022   Anal bleeding 02/03/2021   External hemorrhoid, bleeding 02/03/2021   Tremor 11/18/2020   Chronic sinus complaints    A-fib (HCC)    At increased risk of exposure to COVID-19 virus 10/21/2020   Bronchial pneumonia 10/21/2020   Hematochezia 02/05/2020   Special screening examination for viral disease 02/05/2020   Hereditary essential tremor 12/06/2018   Lumbar foraminal stenosis 12/06/2018   Ulnar neuropathy at elbow 02/22/2018   Cervical radiculopathy at C7 02/22/2018   Dilated cardiomyopathy (HCC) 08/20/2016   NSVT (nonsustained ventricular tachycardia) (HCC) 05/09/2015   Hypertension 05/08/2015   Obstructive sleep apnea 05/08/2015   Pulmonary hypertension (HCC) 05/08/2015   Syncope 06/25/2014   Atrial fibrillation (HCC) 06/25/2014   Hypotension, unspecified 06/25/2014   Essential hypertension, benign 06/25/2014   Gastroesophageal reflux disease without esophagitis 06/25/2014   Spondylolisthesis of lumbar region 06/22/2014    Past Surgical History:  Procedure Laterality Date  BACK SURGERY     CARDIAC CATHETERIZATION     DONE IN [place], 32YRS AGO   CHOLECYSTECTOMY     FOOT SURGERY     HEMORRHOID SURGERY     HERNIA REPAIR     JOINT REPLACEMENT     BILATER KNEE REPLACEMENTS   TRANSURETHRAL RESECTION OF PROSTATE         Home Medications    Prior to Admission medications   Medication Sig Start Date End Date Taking? Authorizing Provider  acetaminophen  (TYLENOL ) 650 MG CR tablet Take 650 mg by mouth as needed for pain.   Yes [provider]  allopurinol  (ZYLOPRIM ) 100 MG tablet Take 1 tablet (100 mg total) by mouth daily. 12/24/23  Yes Cyndi Drain, PA-C  aspirin  EC 81 MG tablet Take 81 mg by mouth daily.   Yes [provider]  cephALEXin (KEFLEX) 500 MG capsule Take 1 capsule (500 mg total) by mouth 4 (four) times daily for 7 days. 03/10/24 03/17/24 Yes Guss Legacy, FNP  diltiazem  (TIAZAC ) 360 MG 24 hr capsule Take 1 capsule (360 mg total) by mouth daily. 11/04/23  Yes Cyndi Drain, PA-C  ezetimibe  (ZETIA ) 10 MG tablet TAKE 1 TABLET(10 MG) BY MOUTH DAILY Patient taking differently: Take 10 mg by mouth daily. 01/25/24  Yes Cyndi Drain, PA-C  fluticasone  (FLONASE ) 50 MCG/ACT nasal spray Place 2 sprays into both nostrils daily. 01/20/23  Yes Cyndi Drain, PA-C  furosemide  (LASIX ) 20 MG tablet Take 40 mg (2 tablets) daily for 3 days then take 20 mg (1 tablet) daily 02/23/24  Yes Krasowski, Robert J, MD  lisinopril  (ZESTRIL ) 40 MG tablet Take 20 mg by mouth daily.   Yes [provider]  meloxicam  (MOBIC ) 15 MG tablet Take 1 tablet (15 mg total) by mouth daily. 11/29/23  Yes Cyndi Drain, PA-C  metoprolol  succinate (TOPROL -XL) 50 MG 24 hr tablet Take 1 tablet (50 mg total) by mouth daily. 12/01/23  Yes Krasowski, Robert J, MD  Multiple Vitamins-Minerals (ONE A DAY MEN 50 PLUS PO) Take 1 tablet by mouth daily.   Yes [provider]  Multiple Vitamins-Minerals (PRESERVISION AREDS 2 PO) Take 1 tablet by mouth daily.   Yes [provider]  mupirocin ointment (BACTROBAN) 2 % Apply 1 Application topically 2 (two) times daily. 03/10/24  Yes Guss Legacy, FNP  pantoprazole  (PROTONIX ) 40 MG tablet Take 1 tablet (40 mg total) by mouth daily. 10/25/23  Yes Cyndi Drain, PA-C  rosuvastatin  (CRESTOR ) 40 MG tablet Take 1 tablet (40 mg total) by mouth daily. 01/13/24  Yes Cyndi Drain, PA-C  loratadine (CLARITIN) 10 MG tablet Take 10 mg by mouth as needed for allergies.    [provider]  Olopatadine HCl  (PATADAY OP) Apply 1 drop to eye daily. 1 drop in each eye daily    [provider]  polyethylene glycol (MIRALAX / GLYCOLAX) packet Take 17 g by mouth daily as needed for mild constipation or moderate constipation.    [provider]  potassium chloride  (KLOR-CON ) 10 MEQ tablet Take 10 meq (1 tablet) while taking 40 mg Lasix  02/23/24   Krasowski, Robert J, MD    Family History Family History  Problem Relation Age of Onset   Congestive Heart Failure Father    CAD Father    Congestive Heart Failure Brother    CAD Brother    Hypertension Other    Diabetes Other    Colon cancer Mother     Social History Social History  Tobacco Use   Smoking status: Former    Current packs/day: 0.00    Types: Cigarettes    Quit date: 09/20/2007    Years since quitting: 16.4   Smokeless tobacco: Never  Vaping Use   Vaping status: Never Used  Substance Use Topics   Alcohol use: Yes    Comment: occasional/rare   Drug use: No     Allergies   Patient has no known allergies.   Review of Systems Review of Systems  Constitutional:  Negative for fever.  Respiratory:  Negative for cough.   Cardiovascular:  Negative for chest pain.  Gastrointestinal:  Negative for abdominal pain, constipation, diarrhea, nausea and vomiting.  Musculoskeletal:  Positive for arthralgias (Right lower leg pain.). Negative for back pain.  Skin:  Negative for color change and rash.  Neurological:  Negative for syncope.  All other systems reviewed and are negative.    Physical Exam Triage Vital Signs ED Triage Vitals  Encounter Vitals Group     BP 03/10/24 1025 (!) 141/78     Systolic BP Percentile --      Diastolic BP Percentile --      Pulse Rate 03/10/24 1025 68     Resp 03/10/24 1025 18     Temp 03/10/24 1025 97.8 F (36.6 C)     Temp Source 03/10/24 1025 Oral     SpO2 03/10/24 1025 94 %     Weight --      Height --      Head Circumference --      Peak Flow --      Pain Score  03/10/24 1023 10     Pain Loc --      Pain Education --      Exclude from Growth Chart --    No data found.  Updated Vital Signs BP (!) 141/78 (BP Location: Right Arm)   Pulse 68   Temp 97.8 F (36.6 C) (Oral)   Resp 18   SpO2 94%   Visual Acuity Right Eye Distance:   Left Eye Distance:   Bilateral Distance:    Right Eye Near:   Left Eye Near:    Bilateral Near:     Physical Exam Vitals and nursing note reviewed.  Constitutional:      General: He is not in acute distress.    Appearance: He is well-developed. He is not ill-appearing or toxic-appearing.  HENT:     Head: Normocephalic and atraumatic.     Right Ear: External ear normal.     Left Ear: External ear normal.     Nose: Nose normal.     Mouth/Throat:     Lips: Pink.     Mouth: Mucous membranes are moist.  Eyes:     Conjunctiva/sclera: Conjunctivae normal.     Pupils: Pupils are equal, round, and reactive to light.  Cardiovascular:     Rate and Rhythm: Normal rate and regular rhythm.     Heart sounds: S1 normal and S2 normal. No murmur heard. Pulmonary:     Effort: Pulmonary effort is normal. No respiratory distress.     Breath sounds: Normal breath sounds. No decreased breath sounds, wheezing, rhonchi or rales.  Musculoskeletal:        General: No swelling.     Right knee: Normal.     Left knee: Normal.     Right lower leg: Tenderness (Early above the right ankle with some abrasion noted on the skin.  No weeping or discharge from  the abrasion.  The abrasion is 2 cm long and half centimeter wide) present. 3+ Edema present.     Left lower leg: 3+ Edema present.     Right ankle: Swelling present.     Left ankle: Swelling present.     Comments: Swelling bilaterally from knees to toe tips.  He has great range of motion of his feet and ankles.  He has some shiny skin on the feet and lower legs with loss of hair and hyperpigmentation secondary to chronic edema.  He is very tender along the right lower leg  laterally above the ankle with a small 2 cm by half centimeter wide abrasion with minimal erythema and no exudate.  Skin:    General: Skin is warm and dry.     Capillary Refill: Capillary refill takes less than 2 seconds.     Findings: No rash.  Neurological:     Mental Status: He is alert and oriented to person, place, and time.  Psychiatric:        Mood and Affect: Mood normal.      UC Treatments / Results  Labs (all labs ordered are listed, but only abnormal results are displayed) Basic metabolic panel with GFR: 03/06/2024:    Component Ref Range & Units (hover) 4 d ago (03/06/24)  Glucose 126 High   BUN 10  Creatinine, Ser 0.76  eGFR 91  BUN/Creatinine Ratio 13  Sodium 139  Potassium 4.0  Chloride 99  CO2 23  Calcium  9.1  Resulting Agency [facility]       Uric acid       Component Ref Range & Units (hover) 5 mo ago (10/12/23) 1 yr ago (03/04/23) 1 yr ago (05/26/22) 2 yr ago (08/05/21)  Uric Acid 5.1 4.8 CM 4.3 CM 4.6 CM  Comment:            Therapeutic target for gout patients: <6.0  Resulting Agency [facility]      EKG   Radiology DG Tibia/Fibula Right Result Date: 03/10/2024 CLINICAL DATA:  Right lower leg pain and swelling after injury last night. EXAM: RIGHT TIBIA AND FIBULA - 2 VIEW COMPARISON:  None Available. FINDINGS: There is no evidence of fracture or other focal bone lesions. Status post right total knee arthroplasty. Soft tissues are unremarkable. IMPRESSION: No acute abnormality seen. Electronically Signed   By: Rosalene Colon M.D.   On: 03/10/2024 11:15    Procedures Procedures (including critical care time)  Medications Ordered in UC Medications - No data to display  Initial Impression / Assessment and Plan / UC Course  I have reviewed the triage vital signs and the nursing notes.  Pertinent labs & imaging results that were available during my care of the patient were reviewed by me and considered in my medical decision  making (see chart for details).     Plan of Care: Right lower leg pain: X-ray was negative for any fractures.  Use OTC ibuprofen or acetaminophen  as needed for pain. Stasis dermatitis and chronic dependent edema of both lower legs: Patient has absolute changes in his skin that show stasis dermatitis.  He is on furosemide  for reduction of the edema.  Advised that this chronic edema and stasis dermatitis puts him at high risk for leg ulcerations.  Encouraged him to speak to his primary care for alternative or additional treatment options.  He may need compression hose or even to see a lymphedema specialist. Abrasion to right lower leg: Clean with warm  soapy water, rinse, pat dry, apply mupirocin ointment to the site and a bandage.  If any drainage/pus, worsening redness or pain, start on the cephalexin 500 mg 1 pill 4 times a day for 7 days.  Follow-up with primary care on 03/17/2024, as planned. Final Clinical Impressions(s) / UC Diagnoses   Final diagnoses:  Pain and swelling of right lower leg  Stasis dermatitis of both legs  Dependent edema  Abrasion of right lower leg, initial encounter     Discharge Instructions      Right lower leg pain: Negative for fracture.  May use OTC ibuprofen or acetaminophen  for pain.  Stasis dermatitis and chronic dependent edema of lower legs: Advised that he has significant swelling and is developing chronic stasis dermatitis.  He does take furosemide  to help the swelling in his legs.  He did not take it today because he was coming out of the house.  Encouraged to follow-up with family practice as this puts him at high risk for ulcerations on his legs.  He may need compression hose or other treatment options.  Abrasion to right lower leg: Clean with warm soapy fingers, rinse, pat dry, apply mupirocin ointment and a bandage.  If any redness, discharge, signs of infection, started on cephalexin 500 mg 1 pill 4 times a day for 7 days.  Follow-up with primary  care on 03/17/2024, as planned.   ED Prescriptions     Medication Sig Dispense Auth. Provider   mupirocin ointment (BACTROBAN) 2 % Apply 1 Application topically 2 (two) times daily. 22 g Guss Legacy, FNP   cephALEXin (KEFLEX) 500 MG capsule Take 1 capsule (500 mg total) by mouth 4 (four) times daily for 7 days. 28 capsule Guss Legacy, FNP      PDMP not reviewed this encounter.   Guss Legacy, FNP 03/10/24 [phone]

## 2024-03-10 NOTE — Discharge Instructions (Addendum)
 Right lower leg pain: Negative for fracture.  May use OTC ibuprofen or acetaminophen  for pain.  Stasis dermatitis and chronic dependent edema of lower legs: Advised that he has significant swelling and is developing chronic stasis dermatitis.  He does take furosemide  to help the swelling in his legs.  He did not take it today because he was coming out of the house.  Encouraged to follow-up with family practice as this puts him at high risk for ulcerations on his legs.  He may need compression hose or other treatment options.  Abrasion to right lower leg: Clean with warm soapy fingers, rinse, pat dry, apply mupirocin ointment and a bandage.  If any redness, discharge, signs of infection, started on cephalexin 500 mg 1 pill 4 times a day for 7 days.  Follow-up with primary care on 03/17/2024, as planned.

## 2024-03-17 DIAGNOSIS — R251 Tremor, unspecified: Secondary | ICD-10-CM | POA: Diagnosis not present

## 2024-04-03 ENCOUNTER — Ambulatory Visit (INDEPENDENT_AMBULATORY_CARE_PROVIDER_SITE_OTHER): Admitting: Physician Assistant

## 2024-04-03 ENCOUNTER — Encounter: Payer: Self-pay | Admitting: Physician Assistant

## 2024-04-03 VITALS — BP 136/82 | HR 71 | Temp 97.7°F | Resp 18 | Ht 74.0 in | Wt 224.0 lb

## 2024-04-03 DIAGNOSIS — J069 Acute upper respiratory infection, unspecified: Secondary | ICD-10-CM

## 2024-04-03 DIAGNOSIS — J060 Acute laryngopharyngitis: Secondary | ICD-10-CM

## 2024-04-03 MED ORDER — FLUTICASONE PROPIONATE 50 MCG/ACT NA SUSP: 2.0000 | Freq: Every day | NASAL | 6 refills | Status: AC

## 2024-04-03 MED ORDER — TRIAMCINOLONE ACETONIDE 40 MG/ML IJ SUSP
60.0000 mg | Freq: Once | INTRAMUSCULAR | Status: AC
  Administered 2024-04-03: 60 mg via INTRAMUSCULAR

## 2024-04-03 MED ORDER — HYDROCODONE BIT-HOMATROP MBR 5-1.5 MG/5ML PO SOLN: 5.0000 mL | Freq: Four times a day (QID) | ORAL | 0 refills | Status: DC | PRN

## 2024-04-03 MED ORDER — AZITHROMYCIN 250 MG PO TABS: ORAL_TABLET | ORAL | 0 refills | Status: AC

## 2024-04-03 NOTE — Progress Notes (Signed)
 Acute Office Visit  Subjective:    Patient ID: Adam Bennett, male    DOB: 1941/02/25, 83 y.o.   MRN: [id number]  Chief Complaint  Patient presents with   Allergic Reaction    HPI: Patient is in today for complaints of productive cough, sinus congestion and sore throat for the past several days - denies fever Has had sinus pressure and pnd Denies chest pain or dyspnea   Current Outpatient Medications:    acetaminophen  (TYLENOL ) 650 MG CR tablet, Take 650 mg by mouth as needed for pain., Disp: , Rfl:    allopurinol  (ZYLOPRIM ) 100 MG tablet, Take 1 tablet (100 mg total) by mouth daily., Disp: 90 tablet, Rfl: 1   aspirin  EC 81 MG tablet, Take 81 mg by mouth daily., Disp: , Rfl:    azithromycin  (ZITHROMAX ) 250 MG tablet, Take 2 tablets on day 1, then 1 tablet daily on days 2 through 5, Disp: 6 tablet, Rfl: 0   diltiazem  (TIAZAC ) 360 MG 24 hr capsule, Take 1 capsule (360 mg total) by mouth daily., Disp: 90 capsule, Rfl: 1   furosemide  (LASIX ) 20 MG tablet, Take 40 mg (2 tablets) daily for 3 days then take 20 mg (1 tablet) daily, Disp: 93 tablet, Rfl: 3   HYDROcodone  bit-homatropine (HYDROMET) 5-1.5 MG/5ML syrup, Take 5 mLs by mouth every 6 (six) hours as needed., Disp: 120 mL, Rfl: 0   lisinopril  (ZESTRIL ) 40 MG tablet, Take 20 mg by mouth daily., Disp: , Rfl:    loratadine (CLARITIN) 10 MG tablet, Take 10 mg by mouth as needed for allergies., Disp: , Rfl:    meloxicam  (MOBIC ) 15 MG tablet, Take 1 tablet (15 mg total) by mouth daily., Disp: 90 tablet, Rfl: 1   metoprolol  succinate (TOPROL -XL) 50 MG 24 hr tablet, Take 1 tablet (50 mg total) by mouth daily., Disp: 90 tablet, Rfl: 2   Multiple Vitamins-Minerals (ONE A DAY MEN 50 PLUS PO), Take 1 tablet by mouth daily., Disp: , Rfl:    Multiple Vitamins-Minerals (PRESERVISION AREDS 2 PO), Take 1 tablet by mouth daily., Disp: , Rfl:    mupirocin  ointment (BACTROBAN ) 2 %, Apply 1 Application topically 2 (two) times daily., Disp: 22 g, Rfl: 0    Olopatadine HCl (PATADAY OP), Apply 1 drop to eye daily. 1 drop in each eye daily, Disp: , Rfl:    pantoprazole  (PROTONIX ) 40 MG tablet, Take 1 tablet (40 mg total) by mouth daily., Disp: 90 tablet, Rfl: 1   polyethylene glycol (MIRALAX / GLYCOLAX) packet, Take 17 g by mouth daily as needed for mild constipation or moderate constipation., Disp: , Rfl:    primidone (MYSOLINE) 50 MG tablet, Take by mouth., Disp: , Rfl:    rosuvastatin  (CRESTOR ) 40 MG tablet, Take 1 tablet (40 mg total) by mouth daily., Disp: 90 tablet, Rfl: 1   fluticasone  (FLONASE ) 50 MCG/ACT nasal spray, Place 2 sprays into both nostrils daily., Disp: 16 g, Rfl: 6  No Known Allergies  ROS CONSTITUTIONAL: Negative for chills, fatigue, fever,  E/N/T: see HPI CARDIOVASCULAR: Negative for chest pain, dizziness, palpitations and pedal edema.  RESPIRATORY: see HPI      Objective:    PHYSICAL EXAM:   BP 136/82   Pulse 71   Temp 97.7 F (36.5 C) (Temporal)   Resp 18   Ht 6\' 2"  (1.88 m)   Wt 224 lb (101.6 kg)   SpO2 97%   BMI 28.76 kg/m    GEN: Well nourished, well developed, in  no acute distress  HEENT: normal external ears and nose - normal external auditory canals and TMS - Lips, Teeth and Gums - normal  Oropharynx - mild pnd  Cardiac: RRR; no murmurs, Respiratory:  normal respiratory rate and pattern with no distress - normal breath sounds with no rales, rhonchi, wheezes or rubs      Assessment & Plan:    Acute laryngopharyngitis -     Triamcinolone  Acetonide 60mg  given IM  Acute upper respiratory infection -     Azithromycin ; Take 2 tablets on day 1, then 1 tablet daily on days 2 through 5  Dispense: 6 tablet; Refill: 0 -     Fluticasone  Propionate; Place 2 sprays into both nostrils daily.  Dispense: 16 g; Refill: 6 -     HYDROcodone  Bit-Homatrop MBr; Take 5 mLs by mouth every 6 (six) hours as needed.  Dispense: 120 mL; Refill: 0     Follow-up: No follow-ups on file.  An After Visit Summary was  printed and given to the patient.  Anthonette Bastos [facility] [phone]

## 2024-04-04 ENCOUNTER — Other Ambulatory Visit: Payer: Self-pay | Admitting: Physician Assistant

## 2024-04-07 ENCOUNTER — Ambulatory Visit: Attending: Cardiology | Admitting: Cardiology

## 2024-04-07 ENCOUNTER — Encounter: Payer: Self-pay | Admitting: Cardiology

## 2024-04-07 VITALS — BP 142/80 | HR 62 | Ht 75.0 in | Wt 219.4 lb

## 2024-04-07 DIAGNOSIS — R0609 Other forms of dyspnea: Secondary | ICD-10-CM | POA: Diagnosis not present

## 2024-04-07 DIAGNOSIS — I420 Dilated cardiomyopathy: Secondary | ICD-10-CM

## 2024-04-07 DIAGNOSIS — I10 Essential (primary) hypertension: Secondary | ICD-10-CM | POA: Diagnosis not present

## 2024-04-07 DIAGNOSIS — R251 Tremor, unspecified: Secondary | ICD-10-CM

## 2024-04-07 DIAGNOSIS — E782 Mixed hyperlipidemia: Secondary | ICD-10-CM

## 2024-04-07 DIAGNOSIS — I480 Paroxysmal atrial fibrillation: Secondary | ICD-10-CM

## 2024-04-07 NOTE — Addendum Note (Signed)
 Addended by: Shawnee Dellen D on: 04/07/2024 01:31 PM   Modules accepted: Orders

## 2024-04-07 NOTE — Progress Notes (Signed)
 Cardiology Office Note:    Date:  04/07/2024   ID:  Adam Bennett, DOB Nov 06, 1940, MRN [id number]  PCP:  Cyndi Drain, PA-C  Cardiologist:  Ralene Burger, MD    Referring MD: Cyndi Drain, PA-C   Chief Complaint  Patient presents with   Results    Labs    History of Present Illness:    Adam Bennett is a 83 y.o. male past medical history significant for essential hypertension which was somewhat difficult to control, atrial fibrillation so far only 1 documented episode refused anticoagulation, COPD, obstructive sleep apnea, cardiomyopathy with last echocardiogram done in August last year showing ejection fraction 5055%.  Comes today 2 months for follow-up concern [place] swollen legs.  Worse at evening time denies have any chest pain tightness squeezing pressure burning chest no shortness of breath.  Past Medical History:  Diagnosis Date   A-fib [facility])    Anal bleeding 02/03/2021   At increased risk of exposure to COVID-19 virus 10/21/2020   Atrial fibrillation (HCC) 06/25/2014   Bilateral carpal tunnel syndrome 02/22/2018   Bronchial pneumonia 10/21/2020   Cervical radiculopathy at C7 02/22/2018   Left   Chronic sinus complaints    Dilated cardiomyopathy (HCC)    Essential hypertension, benign 06/25/2014   GERD (gastroesophageal reflux disease)    Hematochezia 02/05/2020   Hypertension    Hypotension, unspecified 06/25/2014   NSVT (nonsustained ventricular tachycardia) (HCC)    Obstructive sleep apnea 05/08/2015   Pulmonary hypertension (HCC) 05/08/2015   Special screening examination for viral disease 02/05/2020   Spondylolisthesis of lumbar region 06/22/2014   Syncope 06/25/2014   Ulnar neuropathy at elbow 02/22/2018   Bilateral    Past Surgical History:  Procedure Laterality Date   BACK SURGERY     CARDIAC CATHETERIZATION     DONE IN [place], 34YRS AGO   CHOLECYSTECTOMY     FOOT SURGERY     HEMORRHOID SURGERY     HERNIA REPAIR     JOINT REPLACEMENT     BILATER KNEE  REPLACEMENTS   TRANSURETHRAL RESECTION OF PROSTATE      Current Medications: Current Meds  Medication Sig   acetaminophen  (TYLENOL ) 650 MG CR tablet Take 650 mg by mouth as needed for pain.   allopurinol  (ZYLOPRIM ) 100 MG tablet Take 1 tablet (100 mg total) by mouth daily.   aspirin  EC 81 MG tablet Take 81 mg by mouth daily.   azithromycin  (ZITHROMAX ) 250 MG tablet Take 2 tablets on day 1, then 1 tablet daily on days 2 through 5   diltiazem  (TIAZAC ) 360 MG 24 hr capsule Take 1 capsule (360 mg total) by mouth daily.   fluticasone  (FLONASE ) 50 MCG/ACT nasal spray Place 2 sprays into both nostrils daily.   furosemide  (LASIX ) 20 MG tablet Take 40 mg (2 tablets) daily for 3 days then take 20 mg (1 tablet) daily   HYDROcodone  bit-homatropine (HYDROMET) 5-1.5 MG/5ML syrup Take 5 mLs by mouth every 6 (six) hours as needed.   lisinopril  (ZESTRIL ) 40 MG tablet Take 20 mg by mouth daily.   loratadine (CLARITIN) 10 MG tablet Take 10 mg by mouth as needed for allergies.   meloxicam  (MOBIC ) 15 MG tablet Take 1 tablet (15 mg total) by mouth daily.   metoprolol  succinate (TOPROL -XL) 50 MG 24 hr tablet Take 1 tablet (50 mg total) by mouth daily.   Multiple Vitamins-Minerals (ONE A DAY MEN 50 PLUS PO) Take 1 tablet by mouth daily.   Multiple Vitamins-Minerals (PRESERVISION AREDS  2 PO) Take 1 tablet by mouth daily.   mupirocin  ointment (BACTROBAN ) 2 % Apply 1 Application topically 2 (two) times daily.   Olopatadine HCl (PATADAY OP) Apply 1 drop to eye daily. 1 drop in each eye daily   pantoprazole  (PROTONIX ) 40 MG tablet TAKE 1 TABLET(40 MG) BY MOUTH DAILY   polyethylene glycol (MIRALAX / GLYCOLAX) packet Take 17 g by mouth daily as needed for mild constipation or moderate constipation.   primidone (MYSOLINE) 50 MG tablet Take 1 tablet by mouth 3 (three) times daily.   rosuvastatin  (CRESTOR ) 40 MG tablet Take 1 tablet (40 mg total) by mouth daily.   [DISCONTINUED] primidone (MYSOLINE) 50 MG tablet Take by  mouth.     Allergies:   Patient has no known allergies.   Social History   Socioeconomic History   Marital status: Married    Spouse name: Not on file   Number of children: Not on file   Years of education: Not on file   Highest education level: Not on file  Occupational History   Not on file  Tobacco Use   Smoking status: Former    Current packs/day: 0.00    Types: Cigarettes    Quit date: 09/20/2007    Years since quitting: 16.5   Smokeless tobacco: Never  Vaping Use   Vaping status: Never Used  Substance and Sexual Activity   Alcohol use: Yes    Comment: occasional/rare   Drug use: No   Sexual activity: Not Currently  Other Topics Concern   Not on file  Social History Narrative   Not on file   Social Drivers of Health   Financial Resource Strain: Low Risk  (04/21/2023)   Overall Financial Resource Strain (CARDIA)    Difficulty of Paying Living Expenses: Not hard at all  Food Insecurity: No Food Insecurity (04/21/2023)   Hunger Vital Sign    Worried About Running Out of Food in the Last Year: Never true    Ran Out of Food in the Last Year: Never true  Transportation Needs: No Transportation Needs (04/21/2023)   PRAPARE - Administrator, Civil Service (Medical): No    Lack of Transportation (Non-Medical): No  Physical Activity: Sufficiently Active (04/21/2023)   Exercise Vital Sign    Days of Exercise per Week: 5 days    Minutes of Exercise per Session: 40 min  Stress: No Stress Concern Present (04/21/2023)   [employer] of Occupational Health - Occupational Stress Questionnaire    Feeling of Stress : Not at all  Social Connections: Moderately Integrated (04/21/2023)   Social Connection and Isolation Panel    Frequency of Communication with Friends and Family: More than three times a week    Frequency of Social Gatherings with Friends and Family: More than three times a week    Attends Religious Services: More than 4 times per year    Active  Member of [employer] or Organizations: No    Attends Engineer, structural: Never    Marital Status: Married     Family History: The patient's family history includes CAD in his brother and father; Colon cancer in his mother; Congestive Heart Failure in his brother and father; Diabetes in an other family member; Hypertension in an other family member. ROS:   Please see the history of present illness.    All 14 point review of systems negative except as described per history of present illness  EKGs/Labs/Other Studies Reviewed:  Recent Labs: 10/12/2023: ALT 21; Hemoglobin 14.4; Platelets 255; TSH 1.040 11/24/2023: NT-Pro BNP 363 03/06/2024: BUN 10; Creatinine, Ser 0.76; Potassium 4.0; Sodium 139  Recent Lipid Panel    Component Value Date/Time   CHOL 212 (H) 10/12/2023 1114   TRIG 205 (H) 10/12/2023 1114   HDL 41 10/12/2023 1114   CHOLHDL 5.2 (H) 10/12/2023 1114   LDLCALC 134 (H) 10/12/2023 1114   LDLDIRECT 93 11/24/2023 1607    Physical Exam:    VS:  BP (!) 142/80 (BP Location: Right Arm, Patient Position: Sitting)   Pulse 62   Ht 6' 3 (1.905 m)   Wt 219 lb 6.4 oz (99.5 kg)   SpO2 94%   BMI 27.42 kg/m     Wt Readings from Last 3 Encounters:  04/07/24 219 lb 6.4 oz (99.5 kg)  04/03/24 224 lb (101.6 kg)  02/23/24 225 lb (102.1 kg)     GEN:  Well nourished, well developed in no acute distress HEENT: Normal NECK: No JVD; No carotid bruits LYMPHATICS: No lymphadenopathy CARDIAC: RRR, no murmurs, no rubs, no gallops RESPIRATORY:  Clear to auscultation without rales, wheezing or rhonchi  ABDOMEN: Soft, non-tender, non-distended MUSCULOSKELETAL:  No edema; No deformity  SKIN: Warm and dry LOWER EXTREMITIES: 1+ swelling NEUROLOGIC:  Alert and oriented x 3 PSYCHIATRIC:  Normal affect   ASSESSMENT:    1. Dilated cardiomyopathy (HCC)   2. Mixed hyperlipidemia   3. Paroxysmal atrial fibrillation (HCC)   4. Primary hypertension   5. Tremor of both hands     PLAN:    In order of problems listed above:  History of cardiomyopathy concern is swollen legs last proBNP was normal.  Will ask him to take extra Lasix  he takes 20 mg Lasix  in 2 weeks 40 for 2 days and see if there is any improvement.  He admits that he does not want to take Lasix  on the regular basis because it making my wet himself and he does not like it.  But I told him for 2 days he can try just simply stay at home. Mixed dyslipidemia I do not have any recent blood test the last results I got is from 2024 with LDL of 134 HDL 41.  He is on Crestor  40 reluctant to add additional medications. Tremor seems to be improving, paroxysmal atrial fibrillation over 1 episodes documented, does not want to take anticoagulation   Medication Adjustments/Labs and Tests Ordered: Current medicines are reviewed at length with the patient today.  Concerns regarding medicines are outlined above.  No orders of the defined types were placed in this encounter.  Medication changes: No orders of the defined types were placed in this encounter.   Signed, Manfred Seed, MD, [facility] 04/07/2024 1:20 PM    [facility]

## 2024-04-07 NOTE — Patient Instructions (Signed)
 Medication Instructions:  Your physician recommends that you continue on your current medications as directed. Please refer to the Current Medication list given to you today.  *If you need a refill on your cardiac medications before your next appointment, please call your pharmacy*   Lab Work: None Ordered If you have labs (blood work) drawn today and your tests are completely normal, you will receive your results only by: MyChart Message (if you have MyChart) OR A paper copy in the mail If you have any lab test that is abnormal or we need to change your treatment, we will call you to review the results.   Testing/Procedures: Your physician has requested that you have an echocardiogram. Echocardiography is a painless test that uses sound waves to create images of your heart. It provides your doctor with information about the size and shape of your heart and how well your heart's chambers and valves are working. This procedure takes approximately one hour. There are no restrictions for this procedure. Please do NOT wear cologne, perfume, aftershave, or lotions (deodorant is allowed). Please arrive 15 minutes prior to your appointment time.  Please note: We ask at that you not bring children with you during ultrasound (echo/ vascular) testing. Due to room size and safety concerns, children are not allowed in the ultrasound rooms during exams. Our front office staff cannot provide observation of children in our lobby area while testing is being conducted. An adult accompanying a patient to their appointment will only be allowed in the ultrasound room at the discretion of the ultrasound technician under special circumstances. We apologize for any inconvenience.    Follow-Up: At [facility], you and your health needs are our priority.  As part of our continuing mission to provide you with exceptional heart care, we have created designated Provider Care Teams.  These Care Teams include your  primary Cardiologist (physician) and Advanced Practice Providers (APPs -  Physician Assistants and Nurse Practitioners) who all work together to provide you with the care you need, when you need it.  We recommend signing up for the patient portal called "MyChart".  Sign up information is provided on this After Visit Summary.  MyChart is used to connect with patients for Virtual Visits (Telemedicine).  Patients are able to view lab/test results, encounter notes, upcoming appointments, etc.  Non-urgent messages can be sent to your provider as well.   To learn more about what you can do with MyChart, go to [email or web address].    Your next appointment:   1 month(s)  The format for your next appointment:   In Person  Provider:   Gypsy Balsam, MD    Other Instructions NA

## 2024-04-14 ENCOUNTER — Encounter: Payer: Self-pay | Admitting: Physician Assistant

## 2024-04-14 ENCOUNTER — Ambulatory Visit (INDEPENDENT_AMBULATORY_CARE_PROVIDER_SITE_OTHER): Admitting: Physician Assistant

## 2024-04-14 VITALS — BP 130/70 | HR 68 | Temp 98.5°F | Resp 18 | Ht 75.0 in | Wt 216.0 lb

## 2024-04-14 DIAGNOSIS — R251 Tremor, unspecified: Secondary | ICD-10-CM

## 2024-04-14 DIAGNOSIS — J069 Acute upper respiratory infection, unspecified: Secondary | ICD-10-CM

## 2024-04-14 DIAGNOSIS — I10 Essential (primary) hypertension: Secondary | ICD-10-CM | POA: Diagnosis not present

## 2024-04-14 DIAGNOSIS — M1A00X0 Idiopathic chronic gout, unspecified site, without tophus (tophi): Secondary | ICD-10-CM | POA: Diagnosis not present

## 2024-04-14 DIAGNOSIS — E782 Mixed hyperlipidemia: Secondary | ICD-10-CM | POA: Diagnosis not present

## 2024-04-14 DIAGNOSIS — R972 Elevated prostate specific antigen [PSA]: Secondary | ICD-10-CM

## 2024-04-14 DIAGNOSIS — R7303 Prediabetes: Secondary | ICD-10-CM

## 2024-04-14 DIAGNOSIS — K219 Gastro-esophageal reflux disease without esophagitis: Secondary | ICD-10-CM | POA: Diagnosis not present

## 2024-04-14 MED ORDER — ROSUVASTATIN CALCIUM 40 MG PO TABS: 40.0000 mg | ORAL_TABLET | Freq: Every day | ORAL | 1 refills | Status: DC

## 2024-04-14 MED ORDER — HYDROCODONE BIT-HOMATROP MBR 5-1.5 MG/5ML PO SOLN: 5.0000 mL | Freq: Four times a day (QID) | ORAL | 0 refills | Status: DC | PRN

## 2024-04-14 MED ORDER — ALLOPURINOL 100 MG PO TABS: 100.0000 mg | ORAL_TABLET | Freq: Every day | ORAL | 1 refills | Status: AC

## 2024-04-14 NOTE — Progress Notes (Signed)
 Subjective:  Patient ID: Adam Bennett, male    DOB: [birth year]  Age: 83 y.o. MRN: [id number]  No chief complaint on file.   HPI  Pt presents for follow up of hypertension. The patient is tolerating the medication well without side effects. Compliance with treatment has been good; including taking medication as directed , maintains a healthy diet and regular exercise regimen , and following up as directed.--Denies chest pain or dyspnea Currently on cardizem  CD 360, Toprol  XL 50 and zestril  40mg  qd  Pt with history of gout - stable on daily allopurinol  - uses indocin  prn but is having breakthrough pain with indocin  Pt is troubled with arthritis in hands and chronic low back pain --- currently on meloxicam   Pt with history of GERD - symptoms controlled with protonix  40mg    Mixed hyperlipidemia  Pt presents with hyperlipidemia. Compliance with treatment has been good - The patient is compliant with medications, maintains a low cholesterol diet , follows up as directed , and maintains an exercise regimen . The patient denies experiencing any hypercholesterolemia related symptoms. Pt currently on crestor  40mg  qd  Pt complains of tremors in both hands - mostly on right - has been going on for awhile Worse with stress and also after working - has improved with primidone  Pt with prediabetes - due to recheck labwork Has been watching diet  Pt with elevated PSA at last visit - had been referred to urology but they had not heard about referral until this week by letter from urology office - will repeat PSA today and then make appt  Current Outpatient Medications on File Prior to Visit  Medication Sig Dispense Refill   acetaminophen  (TYLENOL ) 650 MG CR tablet Take 650 mg by mouth as needed for pain.     allopurinol  (ZYLOPRIM ) 100 MG tablet Take 1 tablet (100 mg total) by mouth daily. 90 tablet 1   aspirin  EC 81 MG tablet Take 81 mg by mouth daily.     diltiazem  (TIAZAC ) 360 MG 24 hr capsule Take  1 capsule (360 mg total) by mouth daily. 90 capsule 1   fluticasone  (FLONASE ) 50 MCG/ACT nasal spray Place 2 sprays into both nostrils daily. 16 g 6   furosemide  (LASIX ) 20 MG tablet Take 40 mg (2 tablets) daily for 3 days then take 20 mg (1 tablet) daily 93 tablet 3   HYDROcodone  bit-homatropine (HYDROMET) 5-1.5 MG/5ML syrup Take 5 mLs by mouth every 6 (six) hours as needed. 120 mL 0   lisinopril  (ZESTRIL ) 40 MG tablet Take 20 mg by mouth daily.     loratadine (CLARITIN) 10 MG tablet Take 10 mg by mouth as needed for allergies.     meloxicam  (MOBIC ) 15 MG tablet Take 1 tablet (15 mg total) by mouth daily. 90 tablet 1   metoprolol  succinate (TOPROL -XL) 50 MG 24 hr tablet Take 1 tablet (50 mg total) by mouth daily. 90 tablet 2   Multiple Vitamins-Minerals (ONE A DAY MEN 50 PLUS PO) Take 1 tablet by mouth daily.     mupirocin  ointment (BACTROBAN ) 2 % Apply 1 Application topically 2 (two) times daily. 22 g 0   Olopatadine HCl (PATADAY OP) Apply 1 drop to eye daily. 1 drop in each eye daily     pantoprazole  (PROTONIX ) 40 MG tablet TAKE 1 TABLET(40 MG) BY MOUTH DAILY 90 tablet 1   polyethylene glycol (MIRALAX / GLYCOLAX) packet Take 17 g by mouth daily as needed for mild constipation or moderate constipation.  primidone (MYSOLINE) 50 MG tablet Take 1 tablet by mouth 3 (three) times daily.     rosuvastatin  (CRESTOR ) 40 MG tablet Take 1 tablet (40 mg total) by mouth daily. 90 tablet 1   No current facility-administered medications on file prior to visit.   Past Medical History:  Diagnosis Date   A-fib [facility])    Anal bleeding 02/03/2021   At increased risk of exposure to COVID-19 virus 10/21/2020   Atrial fibrillation (HCC) 06/25/2014   Bilateral carpal tunnel syndrome 02/22/2018   Bronchial pneumonia 10/21/2020   Cervical radiculopathy at C7 02/22/2018   Left   Chronic sinus complaints    Dilated cardiomyopathy (HCC)    Essential hypertension, benign 06/25/2014   GERD (gastroesophageal reflux  disease)    Hematochezia 02/05/2020   Hypertension    Hypotension, unspecified 06/25/2014   NSVT (nonsustained ventricular tachycardia) (HCC)    Obstructive sleep apnea 05/08/2015   Pulmonary hypertension (HCC) 05/08/2015   Special screening examination for viral disease 02/05/2020   Spondylolisthesis of lumbar region 06/22/2014   Syncope 06/25/2014   Ulnar neuropathy at elbow 02/22/2018   Bilateral   Past Surgical History:  Procedure Laterality Date   BACK SURGERY     CARDIAC CATHETERIZATION     DONE IN [place], 75YRS AGO   CHOLECYSTECTOMY     FOOT SURGERY     HEMORRHOID SURGERY     HERNIA REPAIR     JOINT REPLACEMENT     BILATER KNEE REPLACEMENTS   TRANSURETHRAL RESECTION OF PROSTATE      Family History  Problem Relation Age of Onset   Congestive Heart Failure Father    CAD Father    Congestive Heart Failure Brother    CAD Brother    Hypertension Other    Diabetes Other    Colon cancer Mother    Social History   Socioeconomic History   Marital status: Married    Spouse name: Not on file   Number of children: Not on file   Years of education: Not on file   Highest education level: Not on file  Occupational History   Not on file  Tobacco Use   Smoking status: Former    Current packs/day: 0.00    Types: Cigarettes    Quit date: 09/20/2007    Years since quitting: 16.5   Smokeless tobacco: Never  Vaping Use   Vaping status: Never Used  Substance and Sexual Activity   Alcohol use: Yes    Comment: occasional/rare   Drug use: No   Sexual activity: Not Currently  Other Topics Concern   Not on file  Social History Narrative   Not on file   Social Drivers of Health   Financial Resource Strain: Low Risk  (04/21/2023)   Overall Financial Resource Strain (CARDIA)    Difficulty of Paying Living Expenses: Not hard at all  Food Insecurity: No Food Insecurity (04/21/2023)   Hunger Vital Sign    Worried About Running Out of Food in the Last Year: Never true    Ran Out  of Food in the Last Year: Never true  Transportation Needs: No Transportation Needs (04/21/2023)   PRAPARE - Administrator, Civil Service (Medical): No    Lack of Transportation (Non-Medical): No  Physical Activity: Sufficiently Active (04/21/2023)   Exercise Vital Sign    Days of Exercise per Week: 5 days    Minutes of Exercise per Session: 40 min  Stress: No Stress Concern Present (04/21/2023)  [employer] of Occupational Health - Occupational Stress Questionnaire    Feeling of Stress : Not at all  Social Connections: Moderately Integrated (04/21/2023)   Social Connection and Isolation Panel    Frequency of Communication with Friends and Family: More than three times a week    Frequency of Social Gatherings with Friends and Family: More than three times a week    Attends Religious Services: More than 4 times per year    Active Member of [employer] or Organizations: No    Attends Banker Meetings: Never    Marital Status: Married   CONSTITUTIONAL: Negative for chills, fatigue, fever, unintentional weight gain and unintentional weight loss.  E/N/T: Negative for ear pain, nasal congestion and sore throat.  CARDIOVASCULAR: Negative for chest pain, dizziness, palpitations and pedal edema.  RESPIRATORY: Negative for recent cough and dyspnea.  GASTROINTESTINAL: Negative for abdominal pain, acid reflux symptoms, constipation, diarrhea, nausea and vomiting.  MSK: Negative for arthralgias and myalgias.  INTEGUMENTARY: Negative for rash.  NEUROLOGICAL: Negative for dizziness and headaches.  PSYCHIATRIC: Negative for sleep disturbance and to question depression screen.  Negative for depression, negative for anhedonia.       Objective:  PHYSICAL EXAM:   VS: BP 130/70   Pulse 68   Temp 98.5 F (36.9 C) (Temporal)   Resp 18   Ht 6' 3 (1.905 m)   Wt 216 lb (98 kg)   SpO2 98%   BMI 27.00 kg/m   GEN: Well nourished, well developed, in no acute distress    Cardiac: RRR; no murmurs, rubs, or gallops,no edema -  Respiratory:  normal respiratory rate and pattern with no distress - normal breath sounds with no rales, rhonchi, wheezes or rubs GI: normal bowel sounds, no masses or tenderness MS: no deformity or atrophy  Skin: warm and dry, no rash  Neuro:  Alert and Oriented x 3, - CN II-Xii grossly intact Psych: euthymic mood, appropriate affect and demeanor      10/12/2023   11:07 AM  MMSE - Mini Mental State Exam  Orientation to time 5  Orientation to Place 5  Registration 3  Attention/ Calculation 4  Recall 3  Language- name 2 objects 2  Language- repeat 1  Language- follow 3 step command 3  Language- read & follow direction 1  Write a sentence 1  Copy design 1  Total score 29     Lab Results  Component Value Date   WBC 4.9 10/12/2023   HGB 14.4 10/12/2023   HCT 43.0 10/12/2023   PLT 255 10/12/2023   GLUCOSE 126 (H) 03/06/2024   CHOL 212 (H) 10/12/2023   TRIG 205 (H) 10/12/2023   HDL 41 10/12/2023   LDLDIRECT 93 11/24/2023   LDLCALC 134 (H) 10/12/2023   ALT 21 10/12/2023   AST 18 10/12/2023   NA 139 03/06/2024   K 4.0 03/06/2024   CL 99 03/06/2024   CREATININE 0.76 03/06/2024   BUN 10 03/06/2024   CO2 23 03/06/2024   TSH 1.040 10/12/2023   INR 0.93 07/02/2010   HGBA1C 6.0 (H) 10/12/2023      Assessment & Plan:   Problem List Items Addressed This Visit       Cardiovascular and Mediastinum   Essential hypertension, benign - Primary   Relevant Orders   Labwork pending    Low salt diet   Continue current meds        Digestive   Gastroesophageal reflux disease without esophagitis Continue current meds  Other   Idiopathic chronic gout without tophus   Relevant Orders   Uric Acid Continue mobic  Continue allopurinol    Mixed hyperlipidemia   Relevant Orders   Lipid panel Watch diet Continue crestor   Chronic bilateral low back pain without sciatica Trial meloxicam  15mg   qd  Prediabetes Watch diet Hgb A1c pending  Enlarged prostate PSA      .  No orders of the defined types were placed in this encounter.   Orders Placed This Encounter  Procedures   CBC with Differential/Platelet   Comprehensive metabolic panel with GFR   TSH   Lipid panel   Hemoglobin A1c   PSA   Uric acid     Follow-up: Return in about 6 months (around 10/14/2024) for chronic fasting follow-up.  An After Visit Summary was printed and given to the patient.  Anthonette Bastos [facility] [phone]

## 2024-04-15 LAB — CBC WITH DIFFERENTIAL/PLATELET
Basophils Absolute: 0 10*3/uL (ref 0.0–0.2)
Basos: 1 %
EOS (ABSOLUTE): 0.1 10*3/uL (ref 0.0–0.4)
Eos: 1 %
Hematocrit: 43.9 % (ref 37.5–51.0)
Hemoglobin: 14.2 g/dL (ref 13.0–17.7)
Immature Grans (Abs): 0 10*3/uL (ref 0.0–0.1)
Immature Granulocytes: 0 %
Lymphocytes Absolute: 1.1 10*3/uL (ref 0.7–3.1)
Lymphs: 15 %
MCH: 30.9 pg (ref 26.6–33.0)
MCHC: 32.3 g/dL (ref 31.5–35.7)
MCV: 96 fL (ref 79–97)
Monocytes Absolute: 0.5 10*3/uL (ref 0.1–0.9)
Monocytes: 7 %
Neutrophils Absolute: 5.6 10*3/uL (ref 1.4–7.0)
Neutrophils: 76 %
Platelets: 208 10*3/uL (ref 150–450)
RBC: 4.59 x10E6/uL (ref 4.14–5.80)
RDW: 12.8 % (ref 11.6–15.4)
WBC: 7.4 10*3/uL (ref 3.4–10.8)

## 2024-04-15 LAB — COMPREHENSIVE METABOLIC PANEL WITH GFR
ALT: 105 IU/L — ABNORMAL HIGH (ref 0–44)
AST: 72 IU/L — ABNORMAL HIGH (ref 0–40)
Albumin: 4.4 g/dL (ref 3.8–4.8)
Alkaline Phosphatase: 107 IU/L (ref 44–121)
BUN/Creatinine Ratio: 16 (ref 10–24)
BUN: 14 mg/dL (ref 8–27)
Bilirubin Total: 0.5 mg/dL (ref 0.0–1.2)
CO2: 21 mmol/L (ref 20–29)
Calcium: 9.3 mg/dL (ref 8.6–10.2)
Chloride: 100 mmol/L (ref 96–106)
Creatinine, Ser: 0.85 mg/dL (ref 0.76–1.27)
Globulin, Total: 1.7 g/dL (ref 1.5–4.5)
Glucose: 103 mg/dL — ABNORMAL HIGH (ref 70–99)
Potassium: 4.3 mmol/L (ref 3.5–5.2)
Sodium: 138 mmol/L (ref 134–144)
Total Protein: 6.1 g/dL (ref 6.0–8.5)
eGFR: 88 mL/min/{1.73_m2} (ref >59)

## 2024-04-15 LAB — LIPID PANEL
Chol/HDL Ratio: 1.9 ratio (ref 0.0–5.0)
Cholesterol, Total: 91 mg/dL — ABNORMAL LOW (ref 100–199)
HDL: 49 mg/dL (ref >39)
LDL Chol Calc (NIH): 24 mg/dL (ref 0–99)
Triglycerides: 91 mg/dL (ref 0–149)
VLDL Cholesterol Cal: 18 mg/dL (ref 5–40)

## 2024-04-15 LAB — URIC ACID: Uric Acid: 4 mg/dL (ref 3.8–8.4)

## 2024-04-15 LAB — HEMOGLOBIN A1C
Est. average glucose Bld gHb Est-mCnc: 137 mg/dL
Hgb A1c MFr Bld: 6.4 % — ABNORMAL HIGH (ref 4.8–5.6)

## 2024-04-15 LAB — PSA: Prostate Specific Ag, Serum: 3.5 ng/mL (ref 0.0–4.0)

## 2024-04-15 LAB — TSH: TSH: 0.724 u[IU]/mL (ref 0.450–4.500)

## 2024-04-17 ENCOUNTER — Other Ambulatory Visit: Payer: Self-pay | Admitting: Physician Assistant

## 2024-04-17 ENCOUNTER — Ambulatory Visit: Payer: Self-pay | Admitting: Physician Assistant

## 2024-04-17 DIAGNOSIS — R899 Unspecified abnormal finding in specimens from other organs, systems and tissues: Secondary | ICD-10-CM

## 2024-05-01 ENCOUNTER — Ambulatory Visit: Attending: Cardiology

## 2024-05-01 ENCOUNTER — Other Ambulatory Visit

## 2024-05-01 DIAGNOSIS — R0609 Other forms of dyspnea: Secondary | ICD-10-CM | POA: Diagnosis not present

## 2024-05-01 DIAGNOSIS — R899 Unspecified abnormal finding in specimens from other organs, systems and tissues: Secondary | ICD-10-CM

## 2024-05-01 LAB — ECHOCARDIOGRAM COMPLETE
AR max vel: 4.34 cm2
AV Area VTI: 5.07 cm2
AV Area mean vel: 4.25 cm2
AV Mean grad: 2.5 mmHg
AV Peak grad: 5.9 mmHg
Ao pk vel: 1.21 m/s
Area-P 1/2: 1.88 cm2
Calc EF: 51.9 %
MV VTI: 3.17 cm2
S' Lateral: 4.1 cm
Single Plane A2C EF: 54.5 %
Single Plane A4C EF: 49.2 %

## 2024-05-02 ENCOUNTER — Other Ambulatory Visit

## 2024-05-02 ENCOUNTER — Ambulatory Visit: Payer: Self-pay | Admitting: Physician Assistant

## 2024-05-02 LAB — COMPREHENSIVE METABOLIC PANEL WITH GFR
ALT: 75 IU/L — ABNORMAL HIGH (ref 0–44)
AST: 41 IU/L — ABNORMAL HIGH (ref 0–40)
Albumin: 4.2 g/dL (ref 3.7–4.7)
Alkaline Phosphatase: 87 IU/L (ref 44–121)
BUN/Creatinine Ratio: 19 (ref 10–24)
BUN: 15 mg/dL (ref 8–27)
Bilirubin Total: 0.4 mg/dL (ref 0.0–1.2)
CO2: 22 mmol/L (ref 20–29)
Calcium: 9 mg/dL (ref 8.6–10.2)
Chloride: 103 mmol/L (ref 96–106)
Creatinine, Ser: 0.81 mg/dL (ref 0.76–1.27)
Globulin, Total: 1.4 g/dL — ABNORMAL LOW (ref 1.5–4.5)
Glucose: 167 mg/dL — ABNORMAL HIGH (ref 70–99)
Potassium: 4.3 mmol/L (ref 3.5–5.2)
Sodium: 138 mmol/L (ref 134–144)
Total Protein: 5.6 g/dL — ABNORMAL LOW (ref 6.0–8.5)
eGFR: 89 mL/min/1.73 (ref >59)

## 2024-05-02 LAB — HEPATIC FUNCTION PANEL: Bilirubin, Direct: 0.18 mg/dL (ref 0.00–0.40)

## 2024-05-04 ENCOUNTER — Other Ambulatory Visit: Payer: Self-pay | Admitting: Physician Assistant

## 2024-05-04 DIAGNOSIS — E782 Mixed hyperlipidemia: Secondary | ICD-10-CM

## 2024-05-09 ENCOUNTER — Ambulatory Visit: Payer: Self-pay | Admitting: Cardiology

## 2024-05-12 ENCOUNTER — Encounter: Payer: Self-pay | Admitting: Cardiology

## 2024-05-12 ENCOUNTER — Ambulatory Visit: Attending: Cardiology | Admitting: Cardiology

## 2024-05-12 VITALS — BP 152/76 | HR 58 | Ht 75.0 in | Wt 271.0 lb

## 2024-05-12 DIAGNOSIS — I420 Dilated cardiomyopathy: Secondary | ICD-10-CM | POA: Diagnosis not present

## 2024-05-12 DIAGNOSIS — G4733 Obstructive sleep apnea (adult) (pediatric): Secondary | ICD-10-CM

## 2024-05-12 DIAGNOSIS — I10 Essential (primary) hypertension: Secondary | ICD-10-CM

## 2024-05-12 DIAGNOSIS — I480 Paroxysmal atrial fibrillation: Secondary | ICD-10-CM

## 2024-05-12 NOTE — Patient Instructions (Signed)
Medication Instructions:  Your physician recommends that you continue on your current medications as directed. Please refer to the Current Medication list given to you today.  *If you need a refill on your cardiac medications before your next appointment, please call your pharmacy*   Lab Work: None Ordered If you have labs (blood work) drawn today and your tests are completely normal, you will receive your results only by: MyChart Message (if you have MyChart) OR A paper copy in the mail If you have any lab test that is abnormal or we need to change your treatment, we will call you to review the results.   Testing/Procedures: None Ordered   Follow-Up: At [facility], you and your health needs are our priority.  As part of our continuing mission to provide you with exceptional heart care, we have created designated Provider Care Teams.  These Care Teams include your primary Cardiologist (physician) and Advanced Practice Providers (APPs -  Physician Assistants and Nurse Practitioners) who all work together to provide you with the care you need, when you need it.  We recommend signing up for the patient portal called "MyChart".  Sign up information is provided on this After Visit Summary.  MyChart is used to connect with patients for Virtual Visits (Telemedicine).  Patients are able to view lab/test results, encounter notes, upcoming appointments, etc.  Non-urgent messages can be sent to your provider as well.   To learn more about what you can do with MyChart, go to [email or web address].    Your next appointment:   5 month(s)  The format for your next appointment:   In Person  Provider:   Robert Krasowski, MD    Other Instructions NA  

## 2024-05-12 NOTE — Progress Notes (Signed)
 Cardiology Office Note:    Date:  05/12/2024   ID:  BRAIN HONEYCUTT, DOB Dec 14, 1940, MRN [id number]  PCP:  Nicholaus Credit, PA-C  Cardiologist:  Lamar Fitch, MD    Referring MD: Nicholaus Credit, PA-C   Chief Complaint  Patient presents with   Follow-up    History of Present Illness:    Adam Bennett is a 83 y.o. male past medical history significant for essential hypertension which is somewhat difficult to control atrial fibrillation likely so far only 1 documented episode, refusing anticoagulation, COPD, obstructive sleep apnea, cardiomyopathy with ejection fraction lower limits of normal.  Comes today to my office for follow-up issues swollen leg.  He takes some Lasix  but does not like to do it take it only on as-needed basis.  Echocardiogram repeated showed preserved left ventricle ejection fraction on overall doing well.  Complaining of hot water  Past Medical History:  Diagnosis Date   A-fib (HCC)    Anal bleeding 02/03/2021   At increased risk of exposure to COVID-19 virus 10/21/2020   Atrial fibrillation (HCC) 06/25/2014   Bilateral carpal tunnel syndrome 02/22/2018   Bronchial pneumonia 10/21/2020   Cervical radiculopathy at C7 02/22/2018   Left   Chronic sinus complaints    Dilated cardiomyopathy (HCC)    Essential hypertension, benign 06/25/2014   GERD (gastroesophageal reflux disease)    Hematochezia 02/05/2020   Hypertension    Hypotension, unspecified 06/25/2014   NSVT (nonsustained ventricular tachycardia) (HCC)    Obstructive sleep apnea 05/08/2015   Pulmonary hypertension (HCC) 05/08/2015   Special screening examination for viral disease 02/05/2020   Spondylolisthesis of lumbar region 06/22/2014   Syncope 06/25/2014   Ulnar neuropathy at elbow 02/22/2018   Bilateral    Past Surgical History:  Procedure Laterality Date   BACK SURGERY     CARDIAC CATHETERIZATION     DONE IN [place], 23YRS AGO   CHOLECYSTECTOMY     FOOT SURGERY     HEMORRHOID SURGERY     HERNIA REPAIR      JOINT REPLACEMENT     BILATER KNEE REPLACEMENTS   TRANSURETHRAL RESECTION OF PROSTATE      Current Medications: Current Meds  Medication Sig   acetaminophen  (TYLENOL ) 650 MG CR tablet Take 650 mg by mouth as needed for pain.   allopurinol  (ZYLOPRIM ) 100 MG tablet Take 1 tablet (100 mg total) by mouth daily.   aspirin  EC 81 MG tablet Take 81 mg by mouth daily.   diltiazem  (TIAZAC ) 360 MG 24 hr capsule Take 1 capsule (360 mg total) by mouth daily.   fluticasone  (FLONASE ) 50 MCG/ACT nasal spray Place 2 sprays into both nostrils daily.   furosemide  (LASIX ) 20 MG tablet Take 40 mg (2 tablets) daily for 3 days then take 20 mg (1 tablet) daily   HYDROcodone  bit-homatropine (HYDROMET) 5-1.5 MG/5ML syrup Take 5 mLs by mouth every 6 (six) hours as needed.   lisinopril  (ZESTRIL ) 40 MG tablet TAKE 1 TABLET(40 MG) BY MOUTH DAILY   loratadine (CLARITIN) 10 MG tablet Take 10 mg by mouth as needed for allergies.   meloxicam  (MOBIC ) 15 MG tablet Take 1 tablet (15 mg total) by mouth daily.   metoprolol  succinate (TOPROL -XL) 50 MG 24 hr tablet Take 1 tablet (50 mg total) by mouth daily.   Multiple Vitamins-Minerals (ONE A DAY MEN 50 PLUS PO) Take 1 tablet by mouth daily.   mupirocin  ointment (BACTROBAN ) 2 % Apply 1 Application topically 2 (two) times daily.   Olopatadine  HCl (PATADAY OP) Apply 1 drop to eye daily. 1 drop in each eye daily   pantoprazole  (PROTONIX ) 40 MG tablet TAKE 1 TABLET(40 MG) BY MOUTH DAILY   polyethylene glycol (MIRALAX / GLYCOLAX) packet Take 17 g by mouth daily as needed for mild constipation or moderate constipation.   primidone (MYSOLINE) 50 MG tablet Take 1 tablet by mouth 3 (three) times daily.   rosuvastatin  (CRESTOR ) 40 MG tablet Take 1 tablet (40 mg total) by mouth daily.     Allergies:   Patient has no known allergies.   Social History   Socioeconomic History   Marital status: Married    Spouse name: Not on file   Number of children: Not on file   Years of  education: Not on file   Highest education level: Not on file  Occupational History   Not on file  Tobacco Use   Smoking status: Former    Current packs/day: 0.00    Types: Cigarettes    Quit date: 09/20/2007    Years since quitting: 16.6   Smokeless tobacco: Never  Vaping Use   Vaping status: Never Used  Substance and Sexual Activity   Alcohol use: Yes    Comment: occasional/rare   Drug use: No   Sexual activity: Not Currently  Other Topics Concern   Not on file  Social History Narrative   Not on file   Social Drivers of Health   Financial Resource Strain: Low Risk  (04/21/2023)   Overall Financial Resource Strain (CARDIA)    Difficulty of Paying Living Expenses: Not hard at all  Food Insecurity: No Food Insecurity (04/21/2023)   Hunger Vital Sign    Worried About Running Out of Food in the Last Year: Never true    Ran Out of Food in the Last Year: Never true  Transportation Needs: No Transportation Needs (04/21/2023)   PRAPARE - Administrator, Civil Service (Medical): No    Lack of Transportation (Non-Medical): No  Physical Activity: Sufficiently Active (04/21/2023)   Exercise Vital Sign    Days of Exercise per Week: 5 days    Minutes of Exercise per Session: 40 min  Stress: No Stress Concern Present (04/21/2023)   [employer] of Occupational Health - Occupational Stress Questionnaire    Feeling of Stress : Not at all  Social Connections: Moderately Integrated (04/21/2023)   Social Connection and Isolation Panel    Frequency of Communication with Friends and Family: More than three times a week    Frequency of Social Gatherings with Friends and Family: More than three times a week    Attends Religious Services: More than 4 times per year    Active Member of [employer] or Organizations: No    Attends Engineer, structural: Never    Marital Status: Married     Family History: The patient's family history includes CAD in his brother and father;  Colon cancer in his mother; Congestive Heart Failure in his brother and father; Diabetes in an other family member; Hypertension in an other family member. ROS:   Please see the history of present illness.    All 14 point review of systems negative except as described per history of present illness  EKGs/Labs/Other Studies Reviewed:         Recent Labs: 11/24/2023: NT-Pro BNP 363 04/14/2024: Hemoglobin 14.2; Platelets 208; TSH 0.724 05/01/2024: ALT 75; BUN 15; Creatinine, Ser 0.81; Potassium 4.3; Sodium 138  Recent Lipid Panel    Component Value  Date/Time   CHOL 91 (L) 04/14/2024 1124   TRIG 91 04/14/2024 1124   HDL 49 04/14/2024 1124   CHOLHDL 1.9 04/14/2024 1124   LDLCALC 24 04/14/2024 1124   LDLDIRECT 93 11/24/2023 1607    Physical Exam:    VS:  BP (!) 152/76 (BP Location: Left Arm, Patient Position: Sitting)   Pulse (!) 58   Ht 6' 3 (1.905 m)   Wt 271 lb (122.9 kg)   SpO2 97%   BMI 33.87 kg/m     Wt Readings from Last 3 Encounters:  05/12/24 271 lb (122.9 kg)  04/14/24 216 lb (98 kg)  04/07/24 219 lb 6.4 oz (99.5 kg)     GEN:  Well nourished, well developed in no acute distress HEENT: Normal NECK: No JVD; No carotid bruits LYMPHATICS: No lymphadenopathy CARDIAC: RRR, no murmurs, no rubs, no gallops RESPIRATORY:  Clear to auscultation without rales, wheezing or rhonchi  ABDOMEN: Soft, non-tender, non-distended MUSCULOSKELETAL:  No edema; No deformity  SKIN: Warm and dry LOWER EXTREMITIES: no swelling NEUROLOGIC:  Alert and oriented x 3 PSYCHIATRIC:  Normal affect   ASSESSMENT:    1. Paroxysmal atrial fibrillation (HCC)   2. Dilated cardiomyopathy (HCC)   3. Essential hypertension, benign   4. Obstructive sleep apnea    PLAN:    In order of problems listed above:  Paroxysmal atrial fibrillation maintained sinus rhythm refused anticoagulation likely only 1 documented episode. Dilated cardiomyopathy, ejection fraction 5055%. As directed medical therapy  with talk about potential augmenting medications he does not want to do that he said he is happy the way he is. Essential hypertension blood pressure well-controlled continue present management. obstructive sleep apnea that we have followed by internal medicine team   Medication Adjustments/Labs and Tests Ordered: Current medicines are reviewed at length with the patient today.  Concerns regarding medicines are outlined above.  No orders of the defined types were placed in this encounter.  Medication changes: No orders of the defined types were placed in this encounter.   Signed, Lamar DOROTHA Fitch, MD, [facility] 05/12/2024 8:21 AM    [facility]

## 2024-05-15 ENCOUNTER — Other Ambulatory Visit: Payer: Self-pay | Admitting: Physician Assistant

## 2024-05-15 DIAGNOSIS — E782 Mixed hyperlipidemia: Secondary | ICD-10-CM

## 2024-05-16 ENCOUNTER — Other Ambulatory Visit: Payer: Self-pay | Admitting: Physician Assistant

## 2024-05-16 DIAGNOSIS — E782 Mixed hyperlipidemia: Secondary | ICD-10-CM

## 2024-05-16 MED ORDER — EZETIMIBE 10 MG PO TABS: 10.0000 mg | ORAL_TABLET | Freq: Every day | ORAL | 3 refills | Status: AC

## 2024-06-01 NOTE — Telephone Encounter (Signed)
 First attempt: LVM for patient to return call to [phone]  UTI.. frequency visits to bathroom

## 2024-06-01 NOTE — Telephone Encounter (Signed)
 No triage needed, already spoke to provider and scheduled appointment  Message from [facility] sent at 06/01/2024  8:32 AM EDT  UTI.. frequency visits to bathroom   This encounter was created in error - please disregard.

## 2024-06-02 ENCOUNTER — Encounter: Payer: Self-pay | Admitting: Physician Assistant

## 2024-06-02 ENCOUNTER — Ambulatory Visit (INDEPENDENT_AMBULATORY_CARE_PROVIDER_SITE_OTHER): Admitting: Physician Assistant

## 2024-06-02 VITALS — BP 126/86 | HR 60 | Temp 97.5°F | Ht 75.0 in | Wt 219.0 lb

## 2024-06-02 DIAGNOSIS — J060 Acute laryngopharyngitis: Secondary | ICD-10-CM | POA: Diagnosis not present

## 2024-06-02 DIAGNOSIS — R350 Frequency of micturition: Secondary | ICD-10-CM | POA: Diagnosis not present

## 2024-06-02 LAB — POCT URINALYSIS DIP (CLINITEK)
Bilirubin, UA: NEGATIVE
Blood, UA: NEGATIVE
Glucose, UA: NEGATIVE mg/dL
Ketones, POC UA: NEGATIVE mg/dL
Leukocytes, UA: NEGATIVE
Nitrite, UA: NEGATIVE
POC PROTEIN,UA: NEGATIVE
Spec Grav, UA: 1.01 (ref 1.010–1.025)
Urobilinogen, UA: 0.2 U/dL
pH, UA: 6 (ref 5.0–8.0)

## 2024-06-02 LAB — POCT GLUCOSE FINGERSTICK: Glucose: 130 — AB (ref 70–99)

## 2024-06-02 MED ORDER — CIPROFLOXACIN HCL 500 MG PO TABS: 500.0000 mg | ORAL_TABLET | Freq: Two times a day (BID) | ORAL | 0 refills | Status: AC

## 2024-06-02 NOTE — Progress Notes (Signed)
 Subjective:  Patient ID: Adam Bennett, male    DOB: February 08, 1941  Age: 83 y.o. MRN: [id number]  Chief Complaint  Patient presents with   Urinary Frequency    HPI Pt states that he has had more urine frequency than normal - he does drink a lot of tea, water and coffee but for the past several weeks he has been getting up multiple times at night to urinate.  Denies fever, malaise, back pain, abdominal pain Denies dysuria, hematuria  Pt complains of sinus pressure and pnd - wife with similar symptoms Minimal cough - nonproductive     06/02/2024   10:41 AM 01/11/2024    3:02 PM 04/21/2023    1:24 PM 08/06/2022    2:30 PM 03/02/2022    9:54 AM  Depression screen PHQ 2/9  Decreased Interest 0 0 0 0 0  Down, Depressed, Hopeless 0 0 0 0 0  PHQ - 2 Score 0 0 0 0 0  Altered sleeping    0   Tired, decreased energy    0   Change in appetite    0   Feeling bad or failure about yourself     0   Trouble concentrating    0   Moving slowly or fidgety/restless    0   Suicidal thoughts    0   PHQ-9 Score    0   Difficult doing work/chores    Not difficult at all         08/06/2022    2:30 PM 03/04/2023    9:45 AM 04/21/2023    1:24 PM 01/11/2024    3:02 PM 06/02/2024   10:41 AM  Fall Risk  Falls in the past year? 1 0 1 0 1  Was there an injury with Fall? 0 0 0 0 0  Fall Risk Category Calculator 2 0 1 0 2  Fall Risk Category (Retired) Moderate       (RETIRED) Patient Fall Risk Level Moderate fall risk       Patient at Risk for Falls Due to Impaired balance/gait No Fall Risks History of fall(s);No Fall Risks No Fall Risks History of fall(s)  Fall risk Follow up Falls evaluation completed;Falls prevention discussed  Falls evaluation completed Follow up appointment Falls evaluation completed Falls evaluation completed     Data saved with a previous flowsheet row definition     ROS CONSTITUTIONAL: Negative for chills, fatigue, fever, E/N/T: see HPI CARDIOVASCULAR: Negative for chest pain,  dizziness, palpitations and pedal edema.  RESPIRATORY: see HPI GASTROINTESTINAL: Negative for abdominal pain, acid reflux symptoms, constipation, diarrhea, nausea and vomiting.  GU - see HPI MSK: Negative for arthralgias and myalgias.  INTEGUMENTARY: Negative for rash.     Current Outpatient Medications:    acetaminophen  (TYLENOL ) 650 MG CR tablet, Take 650 mg by mouth as needed for pain., Disp: , Rfl:    allopurinol  (ZYLOPRIM ) 100 MG tablet, Take 1 tablet (100 mg total) by mouth daily., Disp: 90 tablet, Rfl: 1   aspirin  EC 81 MG tablet, Take 81 mg by mouth daily., Disp: , Rfl:    ciprofloxacin  (CIPRO ) 500 MG tablet, Take 1 tablet (500 mg total) by mouth 2 (two) times daily for 10 days., Disp: 20 tablet, Rfl: 0   diltiazem  (TIAZAC ) 360 MG 24 hr capsule, Take 1 capsule (360 mg total) by mouth daily., Disp: 90 capsule, Rfl: 1   ezetimibe  (ZETIA ) 10 MG tablet, Take 1 tablet (10 mg total) by mouth daily., Disp:  90 tablet, Rfl: 3   fluticasone  (FLONASE ) 50 MCG/ACT nasal spray, Place 2 sprays into both nostrils daily., Disp: 16 g, Rfl: 6   furosemide  (LASIX ) 20 MG tablet, Take 40 mg (2 tablets) daily for 3 days then take 20 mg (1 tablet) daily, Disp: 93 tablet, Rfl: 3   lisinopril  (ZESTRIL ) 40 MG tablet, TAKE 1 TABLET(40 MG) BY MOUTH DAILY, Disp: 90 tablet, Rfl: 1   loratadine (CLARITIN) 10 MG tablet, Take 10 mg by mouth as needed for allergies., Disp: , Rfl:    meloxicam  (MOBIC ) 15 MG tablet, Take 1 tablet (15 mg total) by mouth daily., Disp: 90 tablet, Rfl: 1   metoprolol  succinate (TOPROL -XL) 50 MG 24 hr tablet, Take 1 tablet (50 mg total) by mouth daily., Disp: 90 tablet, Rfl: 2   Multiple Vitamins-Minerals (ONE A DAY MEN 50 PLUS PO), Take 1 tablet by mouth daily., Disp: , Rfl:    mupirocin  ointment (BACTROBAN ) 2 %, Apply 1 Application topically 2 (two) times daily., Disp: 22 g, Rfl: 0   Olopatadine HCl (PATADAY OP), Apply 1 drop to eye daily. 1 drop in each eye daily, Disp: , Rfl:     pantoprazole  (PROTONIX ) 40 MG tablet, TAKE 1 TABLET(40 MG) BY MOUTH DAILY, Disp: 90 tablet, Rfl: 1   polyethylene glycol (MIRALAX / GLYCOLAX) packet, Take 17 g by mouth daily as needed for mild constipation or moderate constipation., Disp: , Rfl:    primidone (MYSOLINE) 50 MG tablet, Take 1 tablet by mouth 3 (three) times daily., Disp: , Rfl:    rosuvastatin  (CRESTOR ) 40 MG tablet, Take 1 tablet (40 mg total) by mouth daily., Disp: 90 tablet, Rfl: 1  Past Medical History:  Diagnosis Date   A-fib (HCC)    Anal bleeding 02/03/2021   At increased risk of exposure to COVID-19 virus 10/21/2020   Atrial fibrillation (HCC) 06/25/2014   Bilateral carpal tunnel syndrome 02/22/2018   Bronchial pneumonia 10/21/2020   Cervical radiculopathy at C7 02/22/2018   Left   Chronic sinus complaints    Dilated cardiomyopathy (HCC)    Essential hypertension, benign 06/25/2014   GERD (gastroesophageal reflux disease)    Hematochezia 02/05/2020   Hypertension    Hypotension, unspecified 06/25/2014   NSVT (nonsustained ventricular tachycardia) (HCC)    Obstructive sleep apnea 05/08/2015   Pulmonary hypertension (HCC) 05/08/2015   Special screening examination for viral disease 02/05/2020   Spondylolisthesis of lumbar region 06/22/2014   Syncope 06/25/2014   Ulnar neuropathy at elbow 02/22/2018   Bilateral   Objective:  PHYSICAL EXAM:   BP 126/86   Pulse 60   Temp (!) 97.5 F (36.4 C)   Ht 6' 3 (1.905 m)   Wt 219 lb (99.3 kg)   SpO2 97%   BMI 27.37 kg/m    GEN: Well nourished, well developed, in no acute distress  Oropharynx - normal mucosa, palate, and posterior pharynx Cardiac: RRR; no murmurs, rubs,  Respiratory:  normal respiratory rate and pattern with no distress - normal breath sounds with no rales, rhonchi, wheezes or rubs Skin: warm and dry, no rash  Neuro:  Alert and Oriented x 3,  CN II-Xii grossly intact  Office Visit on 06/02/2024  Component Date Value Ref Range Status   Color, UA  06/02/2024 yellow  yellow Final   Clarity, UA 06/02/2024 clear  clear Final   Glucose, UA 06/02/2024 negative  negative mg/dL Final   Bilirubin, UA 91/91/7974 negative  negative Final   Ketones, POC UA 06/02/2024 negative  negative mg/dL Final   Spec Grav, UA 91/91/7974 1.010  1.010 - 1.025 Final   Blood, UA 06/02/2024 negative  negative Final   pH, UA 06/02/2024 6.0  5.0 - 8.0 Final   POC PROTEIN,UA 06/02/2024 negative  negative, trace Final   Urobilinogen, UA 06/02/2024 0.2  0.2 or 1.0 E.U./dL Final   Nitrite, UA 91/91/7974 Negative  Negative Final   Leukocytes, UA 06/02/2024 Negative  Negative Final   Glucose 06/02/2024 130 (A)  70 - 99 Final    Assessment & Plan:    Urine frequency -     POCT URINALYSIS DIP (CLINITEK) -     POCT Glucose Fingerstick Limit night time beverages Follow up with urology as scheduled Acute laryngopharyngitis -     Ciprofloxacin  HCl; Take 1 tablet (500 mg total) by mouth 2 (two) times daily for 10 days.  Dispense: 20 tablet; Refill: 0 Mucinex as needed    Follow-up: Return for as scheduled for next chronic visit.  An After Visit Summary was printed and given to the patient.  CAMIE JONELLE NICHOLAUS DEVONNA [facility] [phone]

## 2024-06-19 DIAGNOSIS — R251 Tremor, unspecified: Secondary | ICD-10-CM | POA: Diagnosis not present

## 2024-08-01 ENCOUNTER — Ambulatory Visit (INDEPENDENT_AMBULATORY_CARE_PROVIDER_SITE_OTHER): Admitting: Physician Assistant

## 2024-08-01 VITALS — BP 132/70 | HR 65 | Temp 98.0°F | Resp 18 | Ht 75.0 in | Wt 223.6 lb

## 2024-08-01 DIAGNOSIS — R42 Dizziness and giddiness: Secondary | ICD-10-CM | POA: Diagnosis not present

## 2024-08-01 DIAGNOSIS — R7303 Prediabetes: Secondary | ICD-10-CM | POA: Diagnosis not present

## 2024-08-01 DIAGNOSIS — G4452 New daily persistent headache (NDPH): Secondary | ICD-10-CM

## 2024-08-01 DIAGNOSIS — I10 Essential (primary) hypertension: Secondary | ICD-10-CM

## 2024-08-01 DIAGNOSIS — R9431 Abnormal electrocardiogram [ECG] [EKG]: Secondary | ICD-10-CM

## 2024-08-01 DIAGNOSIS — I420 Dilated cardiomyopathy: Secondary | ICD-10-CM | POA: Diagnosis not present

## 2024-08-01 MED ORDER — LISINOPRIL 40 MG PO TABS: 40.0000 mg | ORAL_TABLET | Freq: Every day | ORAL | 1 refills | Status: AC

## 2024-08-01 MED ORDER — DILTIAZEM HCL ER BEADS 360 MG PO CP24: 360.0000 mg | ORAL_CAPSULE | Freq: Every day | ORAL | 1 refills | Status: DC

## 2024-08-01 MED ORDER — PRIMIDONE 50 MG PO TABS: 50.0000 mg | ORAL_TABLET | Freq: Two times a day (BID) | ORAL | Status: AC

## 2024-08-01 NOTE — Progress Notes (Unsigned)
 Subjective:  Patient ID: Adam Bennett, male    DOB: Jan 20, 1941  Age: 83 y.o. MRN: [id number]  Chief Complaint  Patient presents with   Dizziness    HPI Pt in today with wife to discuss he has been having intermittent episodes of dizziness for the past several months.  He states the episodes can last a few seconds to a few minutes.  He has the episodes with getting up and down at times but mostly with mild exertion.  He can feel at times some mild palpitations as well.  The episodes do cause him to feel staggery and has stumbled in past.  He has had associated intermittent fatigue as well Pt denies chest pain or dyspnea.  He is bothered with mild pedal edema Pt does see cardiologist Dr Bernie.  He had a myocardial perfusion stress test 3/25 which was stable and an echocardiogram 04/2024 that showed low normal ejection fraction and mildly dilated left atrium.his last event monitor was in 2021 which showed several runs of SVT but was not symptomatic at that time and was not treated.  Pt does have history of one episode of afib about 15 years ago after having back surgery.     06/02/2024   10:41 AM 01/11/2024    3:02 PM 04/21/2023    1:24 PM 08/06/2022    2:30 PM 03/02/2022    9:54 AM  Depression screen PHQ 2/9  Decreased Interest 0 0 0 0 0  Down, Depressed, Hopeless 0 0 0 0 0  PHQ - 2 Score 0 0 0 0 0  Altered sleeping    0   Tired, decreased energy    0   Change in appetite    0   Feeling bad or failure about yourself     0   Trouble concentrating    0   Moving slowly or fidgety/restless    0   Suicidal thoughts    0   PHQ-9 Score    0   Difficult doing work/chores    Not difficult at all         08/06/2022    2:30 PM 03/04/2023    9:45 AM 04/21/2023    1:24 PM 01/11/2024    3:02 PM 06/02/2024   10:41 AM  Fall Risk  Falls in the past year? 1 0 1 0 1  Was there an injury with Fall? 0 0 0 0 0  Fall Risk Category Calculator 2 0 1 0 2  Fall Risk Category (Retired) Moderate        (RETIRED) Patient Fall Risk Level Moderate fall risk       Patient at Risk for Falls Due to Impaired balance/gait No Fall Risks History of fall(s);No Fall Risks No Fall Risks History of fall(s)  Fall risk Follow up Falls evaluation completed;Falls prevention discussed  Falls evaluation completed Follow up appointment Falls evaluation completed Falls evaluation completed     Data saved with a previous flowsheet row definition     ROS CONSTITUTIONAL: Negative for chills, fatigue, fever, unintentional weight gain and unintentional weight loss.  E/N/T: Negative for ear pain, nasal congestion and sore throat.  CARDIOVASCULAR: Negative for chest pain, dizziness, palpitations and pedal edema.  RESPIRATORY: Negative for recent cough and dyspnea.  GASTROINTESTINAL: Negative for abdominal pain, acid reflux symptoms, constipation, diarrhea, nausea and vomiting.  MSK: Negative for arthralgias and myalgias.  INTEGUMENTARY: Negative for rash.  NEUROLOGICAL: Negative for dizziness and headaches.  PSYCHIATRIC: Negative for sleep  disturbance and to question depression screen.  Negative for depression, negative for anhedonia.    Current Outpatient Medications:    acetaminophen  (TYLENOL ) 650 MG CR tablet, Take 650 mg by mouth as needed for pain., Disp: , Rfl:    allopurinol  (ZYLOPRIM ) 100 MG tablet, Take 1 tablet (100 mg total) by mouth daily., Disp: 90 tablet, Rfl: 1   aspirin  EC 81 MG tablet, Take 81 mg by mouth daily., Disp: , Rfl:    ezetimibe  (ZETIA ) 10 MG tablet, Take 1 tablet (10 mg total) by mouth daily., Disp: 90 tablet, Rfl: 3   fluticasone  (FLONASE ) 50 MCG/ACT nasal spray, Place 2 sprays into both nostrils daily., Disp: 16 g, Rfl: 6   furosemide  (LASIX ) 20 MG tablet, Take 40 mg (2 tablets) daily for 3 days then take 20 mg (1 tablet) daily, Disp: 93 tablet, Rfl: 3   loratadine (CLARITIN) 10 MG tablet, Take 10 mg by mouth as needed for allergies., Disp: , Rfl:    meloxicam  (MOBIC ) 15 MG tablet,  Take 1 tablet (15 mg total) by mouth daily., Disp: 90 tablet, Rfl: 1   metoprolol  succinate (TOPROL -XL) 50 MG 24 hr tablet, Take 1 tablet (50 mg total) by mouth daily., Disp: 90 tablet, Rfl: 2   Multiple Vitamins-Minerals (ONE A DAY MEN 50 PLUS PO), Take 1 tablet by mouth daily., Disp: , Rfl:    mupirocin  ointment (BACTROBAN ) 2 %, Apply 1 Application topically 2 (two) times daily., Disp: 22 g, Rfl: 0   Olopatadine HCl (PATADAY OP), Apply 1 drop to eye daily. 1 drop in each eye daily, Disp: , Rfl:    pantoprazole  (PROTONIX ) 40 MG tablet, TAKE 1 TABLET(40 MG) BY MOUTH DAILY, Disp: 90 tablet, Rfl: 1   polyethylene glycol (MIRALAX / GLYCOLAX) packet, Take 17 g by mouth daily as needed for mild constipation or moderate constipation., Disp: , Rfl:    rosuvastatin  (CRESTOR ) 40 MG tablet, Take 1 tablet (40 mg total) by mouth daily., Disp: 90 tablet, Rfl: 1   tamsulosin  (FLOMAX ) 0.4 MG CAPS capsule, SMARTSIG:1 Capsule(s) By Mouth Every Evening, Disp: , Rfl:    diltiazem  (TIAZAC ) 360 MG 24 hr capsule, Take 1 capsule (360 mg total) by mouth daily., Disp: 90 capsule, Rfl: 1   lisinopril  (ZESTRIL ) 40 MG tablet, Take 1 tablet (40 mg total) by mouth daily., Disp: 90 tablet, Rfl: 1   primidone (MYSOLINE) 50 MG tablet, Take 1 tablet (50 mg total) by mouth 2 (two) times daily., Disp: , Rfl:   Past Medical History:  Diagnosis Date   A-fib (HCC)    Anal bleeding 02/03/2021   At increased risk of exposure to COVID-19 virus 10/21/2020   Atrial fibrillation (HCC) 06/25/2014   Bilateral carpal tunnel syndrome 02/22/2018   Bronchial pneumonia 10/21/2020   Cervical radiculopathy at C7 02/22/2018   Left   Chronic sinus complaints    Dilated cardiomyopathy (HCC)    Essential hypertension, benign 06/25/2014   GERD (gastroesophageal reflux disease)    Hematochezia 02/05/2020   Hypertension    Hypotension, unspecified 06/25/2014   NSVT (nonsustained ventricular tachycardia) (HCC)    Obstructive sleep apnea 05/08/2015    Pulmonary hypertension (HCC) 05/08/2015   Special screening examination for viral disease 02/05/2020   Spondylolisthesis of lumbar region 06/22/2014   Syncope 06/25/2014   Ulnar neuropathy at elbow 02/22/2018   Bilateral   Objective:  PHYSICAL EXAM:   BP 132/70   Pulse 65   Temp 98 F (36.7 C) (Temporal)   Resp 18  Ht 6' 3 (1.905 m)   Wt 223 lb 9.6 oz (101.4 kg)   SpO2 98%   BMI 27.95 kg/m  {Vitals History (Optional):23777}  GEN: Well nourished, well developed, in no acute distress  HEENT: normal external ears and nose - normal external auditory canals and TMS - hearing grossly normal - normal nasal mucosa and septum - Lips, Teeth and Gums - normal  Oropharynx - normal mucosa, palate, and posterior pharynx Neck: no JVD or masses - no thyromegaly Cardiac: RRR; no murmurs, rubs, or gallops,no edema - no significant varicosities Respiratory:  normal respiratory rate and pattern with no distress - normal breath sounds with no rales, rhonchi, wheezes or rubs GI: normal bowel sounds, no masses or tenderness MS: no deformity or atrophy  Skin: warm and dry, no rash  Neuro:  Alert and Oriented x 3, Strength and sensation are intact - CN II-Xii grossly intact Psych: euthymic mood, appropriate affect and demeanor  Assessment & Plan:  *** Dizziness -     EKG 12-Lead -     CBC with Differential/Platelet -     Comprehensive metabolic panel with GFR -     TSH -     Magnesium  -     Hemoglobin A1c  Prediabetes -     Hemoglobin A1c  Abnormal EKG -     CBC with Differential/Platelet -     Comprehensive metabolic panel with GFR -     Magnesium   Dilated cardiomyopathy (HCC) -     CBC with Differential/Platelet -     Comprehensive metabolic panel with GFR  Primary hypertension -     Lisinopril ; Take 1 tablet (40 mg total) by mouth daily.  Dispense: 90 tablet; Refill: 1 -     dilTIAZem  HCl ER Beads; Take 1 capsule (360 mg total) by mouth daily.  Dispense: 90 capsule; Refill:  1  Other orders -     Primidone; Take 1 tablet (50 mg total) by mouth 2 (two) times daily.     Follow-up: No follow-ups on file.  An After Visit Summary was printed and given to the patient.  CAMIE JONELLE NICHOLAUS DEVONNA [facility] [phone]

## 2024-08-02 ENCOUNTER — Encounter: Payer: Self-pay | Admitting: Physician Assistant

## 2024-08-02 LAB — COMPREHENSIVE METABOLIC PANEL WITH GFR
ALT: 46 IU/L — ABNORMAL HIGH (ref 0–44)
AST: 33 IU/L (ref 0–40)
Albumin: 4 g/dL (ref 3.7–4.7)
Alkaline Phosphatase: 90 IU/L (ref 48–129)
BUN/Creatinine Ratio: 11 (ref 10–24)
BUN: 11 mg/dL (ref 8–27)
Bilirubin Total: 0.4 mg/dL (ref 0.0–1.2)
CO2: 23 mmol/L (ref 20–29)
Calcium: 8.7 mg/dL (ref 8.6–10.2)
Chloride: 105 mmol/L (ref 96–106)
Creatinine, Ser: 0.96 mg/dL (ref 0.76–1.27)
Globulin, Total: 1.7 g/dL (ref 1.5–4.5)
Glucose: 173 mg/dL — ABNORMAL HIGH (ref 70–99)
Potassium: 4.3 mmol/L (ref 3.5–5.2)
Sodium: 141 mmol/L (ref 134–144)
Total Protein: 5.7 g/dL — ABNORMAL LOW (ref 6.0–8.5)
eGFR: 79 mL/min/1.73 (ref >59)

## 2024-08-02 LAB — HEMOGLOBIN A1C
Est. average glucose Bld gHb Est-mCnc: 151 mg/dL
Hgb A1c MFr Bld: 6.9 % — ABNORMAL HIGH (ref 4.8–5.6)

## 2024-08-02 LAB — CBC WITH DIFFERENTIAL/PLATELET
Basophils Absolute: 0.1 x10E3/uL (ref 0.0–0.2)
Basos: 1 %
EOS (ABSOLUTE): 0.5 x10E3/uL — ABNORMAL HIGH (ref 0.0–0.4)
Eos: 8 %
Hematocrit: 41.5 % (ref 37.5–51.0)
Hemoglobin: 13.8 g/dL (ref 13.0–17.7)
Immature Grans (Abs): 0 x10E3/uL (ref 0.0–0.1)
Immature Granulocytes: 0 %
Lymphocytes Absolute: 1.3 x10E3/uL (ref 0.7–3.1)
Lymphs: 23 %
MCH: 32.5 pg (ref 26.6–33.0)
MCHC: 33.3 g/dL (ref 31.5–35.7)
MCV: 98 fL — ABNORMAL HIGH (ref 79–97)
Monocytes Absolute: 0.6 x10E3/uL (ref 0.1–0.9)
Monocytes: 10 %
Neutrophils Absolute: 3.3 x10E3/uL (ref 1.4–7.0)
Neutrophils: 58 %
Platelets: 186 x10E3/uL (ref 150–450)
RBC: 4.24 x10E6/uL (ref 4.14–5.80)
RDW: 13.4 % (ref 11.6–15.4)
WBC: 5.7 x10E3/uL (ref 3.4–10.8)

## 2024-08-02 LAB — TSH: TSH: 1.31 u[IU]/mL (ref 0.450–4.500)

## 2024-08-02 LAB — MAGNESIUM: Magnesium: 2.1 mg/dL (ref 1.6–2.3)

## 2024-08-03 ENCOUNTER — Other Ambulatory Visit: Payer: Self-pay | Admitting: Physician Assistant

## 2024-08-03 DIAGNOSIS — R42 Dizziness and giddiness: Secondary | ICD-10-CM

## 2024-08-03 DIAGNOSIS — R9431 Abnormal electrocardiogram [ECG] [EKG]: Secondary | ICD-10-CM

## 2024-08-04 ENCOUNTER — Ambulatory Visit (INDEPENDENT_AMBULATORY_CARE_PROVIDER_SITE_OTHER)
Admission: RE | Admit: 2024-08-04 | Discharge: 2024-08-04 | Disposition: A | Discharge status: Home / Self Care | Source: Ambulatory Visit | Attending: Physician Assistant | Admitting: Physician Assistant

## 2024-08-04 DIAGNOSIS — I6782 Cerebral ischemia: Secondary | ICD-10-CM | POA: Diagnosis not present

## 2024-08-04 DIAGNOSIS — G4452 New daily persistent headache (NDPH): Secondary | ICD-10-CM

## 2024-08-04 DIAGNOSIS — R519 Headache, unspecified: Secondary | ICD-10-CM | POA: Diagnosis not present

## 2024-08-08 ENCOUNTER — Ambulatory Visit: Payer: Self-pay | Admitting: Physician Assistant

## 2024-08-10 ENCOUNTER — Telehealth: Payer: Self-pay | Admitting: Cardiology

## 2024-08-10 NOTE — Telephone Encounter (Signed)
 Pt calling in regards to 14 day monitor.

## 2024-08-10 NOTE — Telephone Encounter (Signed)
 Message sent to front desk to check on Zio ordered by PCP and appt to be put on.

## 2024-08-24 ENCOUNTER — Other Ambulatory Visit (HOSPITAL_BASED_OUTPATIENT_CLINIC_OR_DEPARTMENT_OTHER): Payer: Self-pay

## 2024-08-24 MED ORDER — FLUZONE HIGH-DOSE 0.5 ML IM SUSY
0.5000 mL | PREFILLED_SYRINGE | Freq: Once | INTRAMUSCULAR | 0 refills | Status: AC
  Filled 2024-08-24: qty 0.5, 1d supply, fill #0

## 2024-08-26 ENCOUNTER — Other Ambulatory Visit: Payer: Self-pay | Admitting: Cardiology

## 2024-09-08 ENCOUNTER — Telehealth: Payer: Self-pay

## 2024-09-08 NOTE — Telephone Encounter (Signed)
 Please advice:   Copied from CRM #[id number]. Topic: Clinical - Order For Equipment >> Sep 08, 2024  3:58 PM Tysheama G wrote: Reason for CRM: Clayborne from [facility] is calling regarding patient 14 day heart monitor that Dr.Davis ordered the location needs to be changed/updated to where they are sending the heart monitor to. Are they sending it to patient home? If so it needs to be changed to [facility]io home and if they are sending it to the office, then location needs to be changed to [facility] [address]  Callback number [phone]

## 2024-09-11 NOTE — Telephone Encounter (Signed)
 This order was done on 08/03/24 ---- why has it taken 6 weeks to get back to us  about the order? Also it was ordered to be done at [facility] [place] ----- what is the issue?

## 2024-09-27 ENCOUNTER — Ambulatory Visit: Admitting: Cardiology

## 2024-09-27 ENCOUNTER — Ambulatory Visit: Attending: Physician Assistant

## 2024-09-27 DIAGNOSIS — R42 Dizziness and giddiness: Secondary | ICD-10-CM

## 2024-09-27 DIAGNOSIS — R9431 Abnormal electrocardiogram [ECG] [EKG]: Secondary | ICD-10-CM

## 2024-09-28 ENCOUNTER — Ambulatory Visit: Attending: Physician Assistant

## 2024-10-02 ENCOUNTER — Ambulatory Visit: Admitting: Physician Assistant

## 2024-10-02 ENCOUNTER — Encounter: Payer: Self-pay | Admitting: Physician Assistant

## 2024-10-02 VITALS — BP 138/64 | HR 68 | Temp 97.7°F | Resp 16 | Ht 75.0 in | Wt 221.0 lb

## 2024-10-02 DIAGNOSIS — J40 Bronchitis, not specified as acute or chronic: Secondary | ICD-10-CM | POA: Diagnosis not present

## 2024-10-02 DIAGNOSIS — J060 Acute laryngopharyngitis: Secondary | ICD-10-CM

## 2024-10-02 DIAGNOSIS — E782 Mixed hyperlipidemia: Secondary | ICD-10-CM | POA: Diagnosis not present

## 2024-10-02 LAB — POC COVID19 BINAXNOW: SARS Coronavirus 2 Ag: NEGATIVE

## 2024-10-02 LAB — POCT INFLUENZA A/B
Influenza A, POC: NEGATIVE
Influenza B, POC: NEGATIVE

## 2024-10-02 MED ORDER — HYDROCODONE BIT-HOMATROP MBR 5-1.5 MG/5ML PO SOLN: 5.0000 mL | Freq: Four times a day (QID) | ORAL | 0 refills | Status: AC | PRN

## 2024-10-02 MED ORDER — PREDNISONE 20 MG PO TABS: 20.0000 mg | ORAL_TABLET | Freq: Two times a day (BID) | ORAL | 0 refills | Status: DC

## 2024-10-02 MED ORDER — TRIAMCINOLONE ACETONIDE 40 MG/ML IJ SUSP
60.0000 mg | Freq: Once | INTRAMUSCULAR | Status: AC
  Administered 2024-10-02: 60 mg via INTRAMUSCULAR

## 2024-10-02 MED ORDER — ROSUVASTATIN CALCIUM 40 MG PO TABS: 40.0000 mg | ORAL_TABLET | Freq: Every day | ORAL | 1 refills | Status: AC

## 2024-10-02 MED ORDER — AMOXICILLIN-POT CLAVULANATE 875-125 MG PO TABS: 1.0000 | ORAL_TABLET | Freq: Two times a day (BID) | ORAL | 0 refills | Status: DC

## 2024-10-02 NOTE — Progress Notes (Signed)
 Acute Office Visit  Subjective:    Patient ID: TEDD COTTRILL, male    DOB: 10/29/1940, 83 y.o.   MRN: [id number]  Chief Complaint  Patient presents with   URI    HPI: Patient is in today for complaints of cough, congestion, low grade fever, malaise and ear ache since last week.  Cough at times productive.  Has had sore throat as well.  Using mucinex but not helping symptoms Denies chest pain or dyspnea   Current Outpatient Medications:    acetaminophen  (TYLENOL ) 650 MG CR tablet, Take 650 mg by mouth as needed for pain., Disp: , Rfl:    allopurinol  (ZYLOPRIM ) 100 MG tablet, Take 1 tablet (100 mg total) by mouth daily., Disp: 90 tablet, Rfl: 1   amoxicillin -clavulanate (AUGMENTIN ) 875-125 MG tablet, Take 1 tablet by mouth 2 (two) times daily., Disp: 20 tablet, Rfl: 0   aspirin  EC 81 MG tablet, Take 81 mg by mouth daily., Disp: , Rfl:    diltiazem  (TIAZAC ) 360 MG 24 hr capsule, Take 1 capsule (360 mg total) by mouth daily., Disp: 90 capsule, Rfl: 1   ezetimibe  (ZETIA ) 10 MG tablet, Take 1 tablet (10 mg total) by mouth daily., Disp: 90 tablet, Rfl: 3   fluticasone  (FLONASE ) 50 MCG/ACT nasal spray, Place 2 sprays into both nostrils daily., Disp: 16 g, Rfl: 6   furosemide  (LASIX ) 20 MG tablet, Take 40 mg (2 tablets) daily for 3 days then take 20 mg (1 tablet) daily, Disp: 93 tablet, Rfl: 3   HYDROcodone  bit-homatropine (HYDROMET) 5-1.5 MG/5ML syrup, Take 5 mLs by mouth every 6 (six) hours as needed., Disp: 120 mL, Rfl: 0   lisinopril  (ZESTRIL ) 40 MG tablet, Take 1 tablet (40 mg total) by mouth daily., Disp: 90 tablet, Rfl: 1   loratadine (CLARITIN) 10 MG tablet, Take 10 mg by mouth as needed for allergies., Disp: , Rfl:    meloxicam  (MOBIC ) 15 MG tablet, Take 1 tablet (15 mg total) by mouth daily., Disp: 90 tablet, Rfl: 1   metoprolol  succinate (TOPROL -XL) 50 MG 24 hr tablet, Take 1 tablet (50 mg total) by mouth daily., Disp: 90 tablet, Rfl: 2   Multiple Vitamins-Minerals (ONE A DAY MEN 50  PLUS PO), Take 1 tablet by mouth daily., Disp: , Rfl:    mupirocin  ointment (BACTROBAN ) 2 %, Apply 1 Application topically 2 (two) times daily., Disp: 22 g, Rfl: 0   Olopatadine HCl (PATADAY OP), Apply 1 drop to eye daily. 1 drop in each eye daily, Disp: , Rfl:    pantoprazole  (PROTONIX ) 40 MG tablet, TAKE 1 TABLET(40 MG) BY MOUTH DAILY, Disp: 90 tablet, Rfl: 1   polyethylene glycol (MIRALAX / GLYCOLAX) packet, Take 17 g by mouth daily as needed for mild constipation or moderate constipation., Disp: , Rfl:    predniSONE  (DELTASONE ) 20 MG tablet, Take 1 tablet (20 mg total) by mouth 2 (two) times daily with a meal., Disp: 10 tablet, Rfl: 0   primidone  (MYSOLINE ) 50 MG tablet, Take 1 tablet (50 mg total) by mouth 2 (two) times daily., Disp: , Rfl:    rosuvastatin  (CRESTOR ) 40 MG tablet, Take 1 tablet (40 mg total) by mouth daily., Disp: 90 tablet, Rfl: 1   tamsulosin  (FLOMAX ) 0.4 MG CAPS capsule, SMARTSIG:1 Capsule(s) By Mouth Every Evening, Disp: , Rfl:   Current Facility-Administered Medications:    triamcinolone  acetonide (KENALOG -40) injection 60 mg, 60 mg, Intramuscular, Once,   No Known Allergies  ROS CONSTITUTIONAL: see HPI E/N/T: see HPI CARDIOVASCULAR:  Negative for chest pain, dizziness, palpitations and pedal edema.  RESPIRATORY: see HPI GASTROINTESTINAL: Negative for abdominal pain, acid reflux symptoms, constipation, diarrhea, nausea and vomiting.  INTEGUMENTARY: Negative for rash.       Objective:    PHYSICAL EXAM:   BP 138/64   Pulse 68   Temp 97.7 F (36.5 C)   Resp 16   Ht 6' 3 (1.905 m)   Wt 221 lb (100.2 kg)   SpO2 97%   BMI 27.62 kg/m    GEN: Well nourished, well developed, in no acute distress  HEENT: normal external ears and nose - normal external auditory canals and TMS - - Lips, Teeth and Gums - normal  Oropharynx - erythema/pnd Neck: no JVD or masses -  Cardiac: RRR; no murmurs, rubs, Respiratory:  faint scattered rhonchi - clears with  cough  Office Visit on 10/02/2024  Component Date Value Ref Range Status   SARS Coronavirus 2 Ag 10/02/2024 Negative  Negative Final   Influenza A, POC 10/02/2024 Negative  Negative Final   Influenza B, POC 10/02/2024 Negative  Negative Final        Assessment & Plan:    Acute laryngopharyngitis -     POC COVID-19 BinaxNow -     POCT Influenza A/B -     Triamcinolone  Acetonide 60mg  IM -     Amoxicillin -Pot Clavulanate; Take 1 tablet by mouth 2 (two) times daily.  Dispense: 20 tablet; Refill: 0 -     predniSONE ; Take 1 tablet (20 mg total) by mouth 2 (two) times daily with a meal.  Dispense: 10 tablet; Refill: 0 -     HYDROcodone  Bit-Homatrop MBr; Take 5 mLs by mouth every 6 (six) hours as needed.  Dispense: 120 mL; Refill: 0  Bronchitis -     Triamcinolone  Acetonide 60mg  IM -     Amoxicillin -Pot Clavulanate; Take 1 tablet by mouth 2 (two) times daily.  Dispense: 20 tablet; Refill: 0 -     predniSONE ; Take 1 tablet (20 mg total) by mouth 2 (two) times daily with a meal.  Dispense: 10 tablet; Refill: 0 -     HYDROcodone  Bit-Homatrop MBr; Take 5 mLs by mouth every 6 (six) hours as needed.  Dispense: 120 mL; Refill: 0     Follow-up: Return if symptoms worsen or fail to improve.  An After Visit Summary was printed and given to the patient.  CAMIE JONELLE NICHOLAUS DEVONNA [facility] [phone]

## 2024-10-12 ENCOUNTER — Ambulatory Visit: Attending: Cardiology | Admitting: Cardiology

## 2024-10-12 ENCOUNTER — Encounter: Payer: Self-pay | Admitting: Cardiology

## 2024-10-12 VITALS — BP 138/80 | HR 62 | Ht 75.0 in | Wt 212.0 lb

## 2024-10-12 DIAGNOSIS — I480 Paroxysmal atrial fibrillation: Secondary | ICD-10-CM

## 2024-10-12 DIAGNOSIS — G4733 Obstructive sleep apnea (adult) (pediatric): Secondary | ICD-10-CM | POA: Diagnosis not present

## 2024-10-12 DIAGNOSIS — I420 Dilated cardiomyopathy: Secondary | ICD-10-CM | POA: Diagnosis not present

## 2024-10-12 DIAGNOSIS — R251 Tremor, unspecified: Secondary | ICD-10-CM | POA: Diagnosis not present

## 2024-10-12 DIAGNOSIS — I10 Essential (primary) hypertension: Secondary | ICD-10-CM | POA: Diagnosis not present

## 2024-10-12 NOTE — Patient Instructions (Signed)
 Medication Instructions:  Your physician recommends that you continue on your current medications as directed. Please refer to the Current Medication list given to you today.  *If you need a refill on your cardiac medications before your next appointment, please call your pharmacy*   Lab Work: None Ordered If you have labs (blood work) drawn today and your tests are completely normal, you will receive your results only by: MyChart Message (if you have MyChart) OR A paper copy in the mail If you have any lab test that is abnormal or we need to change your treatment, we will call you to review the results.   Testing/Procedures: None Ordered   Follow-Up: At [facility], you and your health needs are our priority.  As part of our continuing mission to provide you with exceptional heart care, we have created designated Provider Care Teams.  These Care Teams include your primary Cardiologist (physician) and Advanced Practice Providers (APPs -  Physician Assistants and Nurse Practitioners) who all work together to provide you with the care you need, when you need it.  We recommend signing up for the patient portal called MyChart.  Sign up information is provided on this After Visit Summary.  MyChart is used to connect with patients for Virtual Visits (Telemedicine).  Patients are able to view lab/test results, encounter notes, upcoming appointments, etc.  Non-urgent messages can be sent to your provider as well.   To learn more about what you can do with MyChart, go to [email or web address].    Your next appointment:   4 month(s)  The format for your next appointment:   In Person  Provider:   Lamar Fitch, MD    Other Instructions NA

## 2024-10-16 ENCOUNTER — Ambulatory Visit: Admitting: Physician Assistant

## 2024-10-16 NOTE — Progress Notes (Signed)
 " Cardiology Office Note:    Date:  10/16/2024   ID:  TRENTEN WATCHMAN, DOB 1940/12/09, MRN [id number]  PCP:  Nicholaus Credit, PA-C  Cardiologist:  Lamar Fitch, MD    Referring MD: Nicholaus Credit, PA-C   Chief Complaint  Patient presents with   Follow-up  Doing fine  History of Present Illness:    Adam Bennett is a 83 y.o. male past medical history significant for essential hypertension which somewhat difficult to control, atrial fibrillation seen for 1 documented episode, refusing anticoagulation, COPD, obstructive sleep apnea, cardiomyopathy ejection fraction lower limits of normal.  Comes today to months for follow-up overall doing well.  He denies of any chest pain tightness squeezing pressure burning chest he is excited about Christmas.  Past Medical History:  Diagnosis Date   A-fib [facility])    Anal bleeding 02/03/2021   At increased risk of exposure to COVID-19 virus 10/21/2020   Atrial fibrillation (HCC) 06/25/2014   Bilateral carpal tunnel syndrome 02/22/2018   Bronchial pneumonia 10/21/2020   Cervical radiculopathy at C7 02/22/2018   Left   Chronic sinus complaints    Dilated cardiomyopathy (HCC)    Essential hypertension, benign 06/25/2014   GERD (gastroesophageal reflux disease)    Hematochezia 02/05/2020   Hypertension    Hypotension, unspecified 06/25/2014   NSVT (nonsustained ventricular tachycardia) (HCC)    Obstructive sleep apnea 05/08/2015   Pulmonary hypertension (HCC) 05/08/2015   Special screening examination for viral disease 02/05/2020   Spondylolisthesis of lumbar region 06/22/2014   Syncope 06/25/2014   Ulnar neuropathy at elbow 02/22/2018   Bilateral    Past Surgical History:  Procedure Laterality Date   BACK SURGERY     CARDIAC CATHETERIZATION     DONE IN [place], 65YRS AGO   CHOLECYSTECTOMY     FOOT SURGERY     HEMORRHOID SURGERY     HERNIA REPAIR     JOINT REPLACEMENT     BILATER KNEE REPLACEMENTS   TRANSURETHRAL RESECTION OF PROSTATE      Current  Medications: Active Medications[1]   Allergies:   Patient has no known allergies.   Social History   Socioeconomic History   Marital status: Married    Spouse name: Not on file   Number of children: Not on file   Years of education: Not on file   Highest education level: Not on file  Occupational History   Not on file  Tobacco Use   Smoking status: Never   Smokeless tobacco: Never  Vaping Use   Vaping status: Never Used  Substance and Sexual Activity   Alcohol use: Yes    Comment: occasional/rare   Drug use: No   Sexual activity: Not Currently  Other Topics Concern   Not on file  Social History Narrative   Not on file   Social Drivers of Health   Tobacco Use: Low Risk (10/12/2024)   Patient History    Smoking Tobacco Use: Never    Smokeless Tobacco Use: Never    Passive Exposure: Not on file  Recent Concern: Tobacco Use - Medium Risk (10/02/2024)   Patient History    Smoking Tobacco Use: Former    Smokeless Tobacco Use: Never    Passive Exposure: Not on file  Financial Resource Strain: Low Risk (04/21/2023)   Overall Financial Resource Strain (CARDIA)    Difficulty of Paying Living Expenses: Not hard at all  Food Insecurity: No Food Insecurity (04/21/2023)   Hunger Vital Sign    Worried About  Running Out of Food in the Last Year: Never true    Ran Out of Food in the Last Year: Never true  Transportation Needs: No Transportation Needs (04/21/2023)   PRAPARE - Administrator, Civil Service (Medical): No    Lack of Transportation (Non-Medical): No  Physical Activity: Sufficiently Active (04/21/2023)   Exercise Vital Sign    Days of Exercise per Week: 5 days    Minutes of Exercise per Session: 40 min  Stress: No Stress Concern Present (04/21/2023)   [employer] of Occupational Health - Occupational Stress Questionnaire    Feeling of Stress : Not at all  Social Connections: Moderately Integrated (04/21/2023)   Social Connection and Isolation Panel     Frequency of Communication with Friends and Family: More than three times a week    Frequency of Social Gatherings with Friends and Family: More than three times a week    Attends Religious Services: More than 4 times per year    Active Member of Clubs or Organizations: No    Attends Banker Meetings: Never    Marital Status: Married  Depression (PHQ2-9): Low Risk (06/02/2024)   Depression (PHQ2-9)    PHQ-2 Score: 0  Alcohol Screen: Low Risk (04/21/2023)   Alcohol Screen    Last Alcohol Screening Score (AUDIT): 0  Housing: Low Risk (04/21/2023)   Housing    Last Housing Risk Score: 0  Utilities: Not At Risk (04/21/2023)   AHC Utilities    Threatened with loss of utilities: No  Health Literacy: Adequate Health Literacy (05/18/2023)   B1300 Health Literacy    Frequency of need for help with medical instructions: Never     Family History: The patient's family history includes CAD in his brother and father; Colon cancer in his mother; Congestive Heart Failure in his brother and father; Diabetes in an other family member; Hypertension in an other family member. ROS:   Please see the history of present illness.    All 14 point review of systems negative except as described per history of present illness  EKGs/Labs/Other Studies Reviewed:         Recent Labs: 11/24/2023: NT-Pro BNP 363 08/01/2024: ALT 46; BUN 11; Creatinine, Ser 0.96; Hemoglobin 13.8; Magnesium  2.1; Platelets 186; Potassium 4.3; Sodium 141; TSH 1.310  Recent Lipid Panel    Component Value Date/Time   CHOL 91 (L) 04/14/2024 1124   TRIG 91 04/14/2024 1124   HDL 49 04/14/2024 1124   CHOLHDL 1.9 04/14/2024 1124   LDLCALC 24 04/14/2024 1124   LDLDIRECT 93 11/24/2023 1607    Physical Exam:    VS:  BP 138/80   Pulse 62   Ht 6' 3 (1.905 m)   Wt 212 lb (96.2 kg)   SpO2 98%   BMI 26.50 kg/m     Wt Readings from Last 3 Encounters:  10/12/24 212 lb (96.2 kg)  10/02/24 221 lb (100.2 kg)  08/01/24 223 lb  9.6 oz (101.4 kg)     GEN:  Well nourished, well developed in no acute distress HEENT: Normal NECK: No JVD; No carotid bruits LYMPHATICS: No lymphadenopathy CARDIAC: RRR, no murmurs, no rubs, no gallops RESPIRATORY:  Clear to auscultation without rales, wheezing or rhonchi  ABDOMEN: Soft, non-tender, non-distended MUSCULOSKELETAL:  No edema; No deformity  SKIN: Warm and dry LOWER EXTREMITIES: no swelling NEUROLOGIC:  Alert and oriented x 3 PSYCHIATRIC:  Normal affect   ASSESSMENT:    1. Tremor of both hands  2. Dilated cardiomyopathy (HCC)   3. Paroxysmal atrial fibrillation (HCC)   4. Primary hypertension   5. Obstructive sleep apnea    PLAN:    In order of problems listed above:  Tremor both hands.  Noted.  Will be referred to neurologist, Dilated cardiomyopathy will plan in the future to do echocardiogram in the meantime continue guideline directed medical therapy. Paroxysmal atrial fibrillation likely only 1 documented episode, refusing anticoagulation continue monitoring. Essential hypertension decently controlled   Medication Adjustments/Labs and Tests Ordered: Current medicines are reviewed at length with the patient today.  Concerns regarding medicines are outlined above.  Orders Placed This Encounter  Procedures   Ambulatory referral to Neurology   Medication changes: No orders of the defined types were placed in this encounter.   Signed, Lamar DOROTHA Fitch, MD, [facility] 10/16/2024 8:08 AM    [facility]    [1]  Current Meds  Medication Sig   acetaminophen  (TYLENOL ) 650 MG CR tablet Take 650 mg by mouth as needed for pain.   allopurinol  (ZYLOPRIM ) 100 MG tablet Take 1 tablet (100 mg total) by mouth daily.   aspirin  EC 81 MG tablet Take 81 mg by mouth daily.   diltiazem  (CARDIZEM  CD) 180 MG 24 hr capsule Take 1 capsule by mouth daily.   Docusate Sodium  100 MG capsule Take 100 mg by mouth at bedtime as needed for constipation.    ezetimibe  (ZETIA ) 10 MG tablet Take 1 tablet (10 mg total) by mouth daily.   fluticasone  (FLONASE ) 50 MCG/ACT nasal spray Place 2 sprays into both nostrils daily.   HYDROcodone  bit-homatropine (HYDROMET) 5-1.5 MG/5ML syrup Take 5 mLs by mouth every 6 (six) hours as needed.   lisinopril  (ZESTRIL ) 40 MG tablet Take 1 tablet (40 mg total) by mouth daily.   loratadine (CLARITIN) 10 MG tablet Take 10 mg by mouth as needed for allergies.   meloxicam  (MOBIC ) 15 MG tablet Take 1 tablet (15 mg total) by mouth daily.   metoprolol  succinate (TOPROL -XL) 50 MG 24 hr tablet Take 1 tablet (50 mg total) by mouth daily.   Multiple Vitamins-Minerals (ONE A DAY MEN 50 PLUS PO) Take 1 tablet by mouth daily.   Olopatadine HCl (PATADAY OP) Apply 1 drop to eye daily. 1 drop in each eye daily   pantoprazole  (PROTONIX ) 40 MG tablet TAKE 1 TABLET(40 MG) BY MOUTH DAILY   polyethylene glycol (MIRALAX / GLYCOLAX) packet Take 17 g by mouth daily as needed for mild constipation or moderate constipation.   primidone  (MYSOLINE ) 50 MG tablet Take 1 tablet (50 mg total) by mouth 2 (two) times daily.   rosuvastatin  (CRESTOR ) 40 MG tablet Take 1 tablet (40 mg total) by mouth daily.   [DISCONTINUED] diltiazem  (TIAZAC ) 360 MG 24 hr capsule Take 1 capsule (360 mg total) by mouth daily.   "

## 2024-10-23 ENCOUNTER — Other Ambulatory Visit: Payer: Self-pay | Admitting: Physician Assistant

## 2024-10-24 ENCOUNTER — Ambulatory Visit: Payer: Self-pay | Admitting: Physician Assistant

## 2024-10-24 DIAGNOSIS — R42 Dizziness and giddiness: Secondary | ICD-10-CM

## 2024-10-25 ENCOUNTER — Ambulatory Visit: Attending: Cardiology | Admitting: Cardiology

## 2024-10-25 ENCOUNTER — Other Ambulatory Visit (HOSPITAL_COMMUNITY): Payer: Self-pay

## 2024-10-25 ENCOUNTER — Encounter: Payer: Self-pay | Admitting: Cardiology

## 2024-10-25 VITALS — BP 134/80 | HR 66 | Ht 75.0 in | Wt 212.6 lb

## 2024-10-25 DIAGNOSIS — I4729 Other ventricular tachycardia: Secondary | ICD-10-CM

## 2024-10-25 DIAGNOSIS — I420 Dilated cardiomyopathy: Secondary | ICD-10-CM

## 2024-10-25 DIAGNOSIS — R7303 Prediabetes: Secondary | ICD-10-CM | POA: Diagnosis not present

## 2024-10-25 DIAGNOSIS — I480 Paroxysmal atrial fibrillation: Secondary | ICD-10-CM

## 2024-10-25 MED ORDER — APIXABAN 5 MG PO TABS: 5.0000 mg | ORAL_TABLET | Freq: Two times a day (BID) | ORAL | Status: AC

## 2024-10-25 MED ORDER — METOPROLOL SUCCINATE ER 50 MG PO TB24: 75.0000 mg | ORAL_TABLET | Freq: Every day | ORAL | 3 refills | Status: AC

## 2024-10-25 MED ORDER — DILTIAZEM HCL ER COATED BEADS 120 MG PO CP24: 120.0000 mg | ORAL_CAPSULE | Freq: Every day | ORAL | 3 refills | Status: AC

## 2024-10-25 MED ORDER — APIXABAN 5 MG PO TABS: 5.0000 mg | ORAL_TABLET | Freq: Two times a day (BID) | ORAL | 6 refills | Status: AC

## 2024-10-25 NOTE — Addendum Note (Signed)
 Addended by: ARLOA PLANAS D on: 10/25/2024 03:40 PM   Modules accepted: Orders

## 2024-10-25 NOTE — Addendum Note (Signed)
 Addended by: ARLOA PLANAS D on: 10/25/2024 03:36 PM   Modules accepted: Orders

## 2024-10-25 NOTE — Progress Notes (Signed)
 " Cardiology Office Note:    Date:  10/25/2024   ID:  Adam Bennett, DOB [birth year], MRN [id number]  PCP:  Nicholaus Credit, PA-C  Cardiologist:  Lamar Fitch, MD    Referring MD: Nicholaus Credit, PA-C   No chief complaint on file. I am here to follow-up monitor  History of Present Illness:    Adam Bennett is a 83 y.o. male past medical history significant for essential hypertension, paroxysmal atrial fibrillation so for 1 documented episode he was refusing anticoagulation, COPD, obstructive sleep apnea, cardiomyopathy ejection fraction lower limits of normal.  Recently he wore a monitor because of dizziness and unsteady gait.  Monitor shows some runs of ventricular tachycardia but short lasting, also supraventricular tachycardia as well as again episode of atrial fibrillation so I brought him to my office and talk about need for anticoagulation Long discussion explanation for reasoning and he agreed to get started.  Denies have any chest pain tightness squeezing pressure burning chest.  Still waiting for call from neurologist to be seen for his tremor I suspect Parkinson  Past Medical History:  Diagnosis Date   A-fib (HCC)    Anal bleeding 02/03/2021   At increased risk of exposure to COVID-19 virus 10/21/2020   Atrial fibrillation (HCC) 06/25/2014   Bilateral carpal tunnel syndrome 02/22/2018   Bronchial pneumonia 10/21/2020   Cervical radiculopathy at C7 02/22/2018   Left   Chronic sinus complaints    Dilated cardiomyopathy (HCC)    Essential hypertension, benign 06/25/2014   GERD (gastroesophageal reflux disease)    Hematochezia 02/05/2020   Hypertension    Hypotension, unspecified 06/25/2014   NSVT (nonsustained ventricular tachycardia) (HCC)    Obstructive sleep apnea 05/08/2015   Pulmonary hypertension (HCC) 05/08/2015   Special screening examination for viral disease 02/05/2020   Spondylolisthesis of lumbar region 06/22/2014   Syncope 06/25/2014   Ulnar neuropathy at elbow 02/22/2018    Bilateral    Past Surgical History:  Procedure Laterality Date   BACK SURGERY     CARDIAC CATHETERIZATION     DONE IN [place], 65YRS AGO   CHOLECYSTECTOMY     FOOT SURGERY     HEMORRHOID SURGERY     HERNIA REPAIR     JOINT REPLACEMENT     BILATER KNEE REPLACEMENTS   TRANSURETHRAL RESECTION OF PROSTATE      Current Medications: Active Medications[1]   Allergies:   Patient has no known allergies.   Social History   Socioeconomic History   Marital status: Married    Spouse name: Not on file   Number of children: Not on file   Years of education: Not on file   Highest education level: Not on file  Occupational History   Not on file  Tobacco Use   Smoking status: Never   Smokeless tobacco: Never  Vaping Use   Vaping status: Never Used  Substance and Sexual Activity   Alcohol use: Yes    Comment: occasional/rare   Drug use: No   Sexual activity: Not Currently  Other Topics Concern   Not on file  Social History Narrative   Not on file   Social Drivers of Health   Tobacco Use: Low Risk (10/25/2024)   Patient History    Smoking Tobacco Use: Never    Smokeless Tobacco Use: Never    Passive Exposure: Not on file  Recent Concern: Tobacco Use - Medium Risk (10/02/2024)   Patient History    Smoking Tobacco Use: Former  Smokeless Tobacco Use: Never    Passive Exposure: Not on file  Financial Resource Strain: Low Risk (04/21/2023)   Overall Financial Resource Strain (CARDIA)    Difficulty of Paying Living Expenses: Not hard at all  Food Insecurity: No Food Insecurity (04/21/2023)   Hunger Vital Sign    Worried About Running Out of Food in the Last Year: Never true    Ran Out of Food in the Last Year: Never true  Transportation Needs: No Transportation Needs (04/21/2023)   PRAPARE - Administrator, Civil Service (Medical): No    Lack of Transportation (Non-Medical): No  Physical Activity: Sufficiently Active (04/21/2023)   Exercise Vital Sign    Days  of Exercise per Week: 5 days    Minutes of Exercise per Session: 40 min  Stress: No Stress Concern Present (04/21/2023)   [employer] of Occupational Health - Occupational Stress Questionnaire    Feeling of Stress : Not at all  Social Connections: Moderately Integrated (04/21/2023)   Social Connection and Isolation Panel    Frequency of Communication with Friends and Family: More than three times a week    Frequency of Social Gatherings with Friends and Family: More than three times a week    Attends Religious Services: More than 4 times per year    Active Member of Clubs or Organizations: No    Attends Banker Meetings: Never    Marital Status: Married  Depression (PHQ2-9): Low Risk (06/02/2024)   Depression (PHQ2-9)    PHQ-2 Score: 0  Alcohol Screen: Low Risk (04/21/2023)   Alcohol Screen    Last Alcohol Screening Score (AUDIT): 0  Housing: Low Risk (04/21/2023)   Housing    Last Housing Risk Score: 0  Utilities: Not At Risk (04/21/2023)   AHC Utilities    Threatened with loss of utilities: No  Health Literacy: Adequate Health Literacy (05/18/2023)   B1300 Health Literacy    Frequency of need for help with medical instructions: Never     Family History: The patient's family history includes CAD in his brother and father; Colon cancer in his mother; Congestive Heart Failure in his brother and father; Diabetes in an other family member; Hypertension in an other family member. ROS:   Please see the history of present illness.    All 14 point review of systems negative except as described per history of present illness  EKGs/Labs/Other Studies Reviewed:         Recent Labs: 11/24/2023: NT-Pro BNP 363 08/01/2024: ALT 46; BUN 11; Creatinine, Ser 0.96; Hemoglobin 13.8; Magnesium  2.1; Platelets 186; Potassium 4.3; Sodium 141; TSH 1.310  Recent Lipid Panel    Component Value Date/Time   CHOL 91 (L) 04/14/2024 1124   TRIG 91 04/14/2024 1124   HDL 49 04/14/2024 1124    CHOLHDL 1.9 04/14/2024 1124   LDLCALC 24 04/14/2024 1124   LDLDIRECT 93 11/24/2023 1607    Physical Exam:    VS:  BP 134/80   Pulse 66   Ht 6' 3 (1.905 m)   Wt 212 lb 9.6 oz (96.4 kg)   SpO2 98%   BMI 26.57 kg/m     Wt Readings from Last 3 Encounters:  10/25/24 212 lb 9.6 oz (96.4 kg)  10/12/24 212 lb (96.2 kg)  10/02/24 221 lb (100.2 kg)     GEN:  Well nourished, well developed in no acute distress HEENT: Normal NECK: No JVD; No carotid bruits LYMPHATICS: No lymphadenopathy CARDIAC: RRR, no murmurs, no  rubs, no gallops RESPIRATORY:  Clear to auscultation without rales, wheezing or rhonchi  ABDOMEN: Soft, non-tender, non-distended MUSCULOSKELETAL:  No edema; No deformity  SKIN: Warm and dry LOWER EXTREMITIES: no swelling NEUROLOGIC:  Alert and oriented x 3 PSYCHIATRIC:  Normal affect   ASSESSMENT:    1. NSVT (nonsustained ventricular tachycardia) (HCC)   2. Paroxysmal atrial fibrillation (HCC)   3. Dilated cardiomyopathy (HCC)   4. Prediabetes    PLAN:    In order of problems listed above:  Nonsustained ventricular tachycardia will increase dose of metoprolol  from 50 mg to 75 mg daily. Paroxysmal atrial fibrillation I had long discussion he agreed to try anticoagulation we will try to put him on Eliquis 5 mg twice daily.  Will check stool for guaiac CBC now and in 10 days. History of dilated cardiomyopathy will increase dose of metoprolol . Supraventricular tachycardia again hopeful be better suppressed with metoprolol  dose increased   Medication Adjustments/Labs and Tests Ordered: Current medicines are reviewed at length with the patient today.  Concerns regarding medicines are outlined above.  No orders of the defined types were placed in this encounter.  Medication changes: No orders of the defined types were placed in this encounter.   Signed, Lamar DOROTHA Fitch, MD, [facility] 10/25/2024 3:18 PM     [facility]     [1]   Current Meds  Medication Sig   acetaminophen  (TYLENOL ) 650 MG CR tablet Take 650 mg by mouth as needed for pain.   allopurinol  (ZYLOPRIM ) 100 MG tablet Take 1 tablet (100 mg total) by mouth daily.   aspirin  EC 81 MG tablet Take 81 mg by mouth daily.   diltiazem  (CARDIZEM  CD) 180 MG 24 hr capsule Take 1 capsule by mouth daily.   Docusate Sodium  100 MG capsule Take 100 mg by mouth at bedtime as needed for constipation.   ezetimibe  (ZETIA ) 10 MG tablet Take 1 tablet (10 mg total) by mouth daily.   fluticasone  (FLONASE ) 50 MCG/ACT nasal spray Place 2 sprays into both nostrils daily.   HYDROcodone  bit-homatropine (HYDROMET) 5-1.5 MG/5ML syrup Take 5 mLs by mouth every 6 (six) hours as needed.   lisinopril  (ZESTRIL ) 40 MG tablet Take 1 tablet (40 mg total) by mouth daily.   loratadine (CLARITIN) 10 MG tablet Take 10 mg by mouth as needed for allergies.   meloxicam  (MOBIC ) 15 MG tablet Take 1 tablet (15 mg total) by mouth daily.   metoprolol  succinate (TOPROL -XL) 50 MG 24 hr tablet Take 1 tablet (50 mg total) by mouth daily.   Multiple Vitamins-Minerals (ONE A DAY MEN 50 PLUS PO) Take 1 tablet by mouth daily.   Olopatadine HCl (PATADAY OP) Apply 1 drop to eye daily. 1 drop in each eye daily   pantoprazole  (PROTONIX ) 40 MG tablet TAKE 1 TABLET(40 MG) BY MOUTH DAILY   polyethylene glycol (MIRALAX / GLYCOLAX) packet Take 17 g by mouth daily as needed for mild constipation or moderate constipation.   primidone  (MYSOLINE ) 50 MG tablet Take 1 tablet (50 mg total) by mouth 2 (two) times daily.   rosuvastatin  (CRESTOR ) 40 MG tablet Take 1 tablet (40 mg total) by mouth daily.   tamsulosin  (FLOMAX ) 0.4 MG CAPS capsule Take 0.4 mg by mouth daily.   "

## 2024-10-25 NOTE — Patient Instructions (Signed)
 Medication Instructions:   STOP: Aspirin    START: Eliquis 5mg  1 tablet twice daily  INCREASE: Metoprolol  Succinate to 75mg  daily  DECREASE: Cardizem  CD to 120mg  daily   Lab Work: CBC, Stool for blood- today Your physician recommends that you return for lab work in: 10 days repeat CBC  You can come Monday through Friday 8:30 am to 12:00 pm and 1:15 to 4:30. You do not need to make an appointment as the order has already been placed.     Testing/Procedures: None Ordered   Follow-Up: At [facility], you and your health needs are our priority.  As part of our continuing mission to provide you with exceptional heart care, we have created designated Provider Care Teams.  These Care Teams include your primary Cardiologist (physician) and Advanced Practice Providers (APPs -  Physician Assistants and Nurse Practitioners) who all work together to provide you with the care you need, when you need it.  We recommend signing up for the patient portal called MyChart.  Sign up information is provided on this After Visit Summary.  MyChart is used to connect with patients for Virtual Visits (Telemedicine).  Patients are able to view lab/test results, encounter notes, upcoming appointments, etc.  Non-urgent messages can be sent to your provider as well.   To learn more about what you can do with MyChart, go to [email or web address].    Your next appointment:   3 month(s)  The format for your next appointment:   In Person  Provider:   Lamar Fitch, MD    Other Instructions NA

## 2024-10-26 LAB — CBC
Hematocrit: 45.8 % (ref 37.5–51.0)
Hemoglobin: 15.2 g/dL (ref 13.0–17.7)
MCH: 31.3 pg (ref 26.6–33.0)
MCHC: 33.2 g/dL (ref 31.5–35.7)
MCV: 94 fL (ref 79–97)
Platelets: 175 x10E3/uL (ref 150–450)
RBC: 4.85 x10E6/uL (ref 4.14–5.80)
RDW: 12.5 % (ref 11.6–15.4)
WBC: 6.6 x10E3/uL (ref 3.4–10.8)

## 2024-10-28 LAB — FECAL OCCULT BLOOD, IMMUNOCHEMICAL: Fecal Occult Bld: POSITIVE — AB

## 2024-10-30 ENCOUNTER — Other Ambulatory Visit: Payer: Self-pay | Admitting: Physician Assistant

## 2024-10-30 ENCOUNTER — Ambulatory Visit: Payer: Self-pay | Admitting: Cardiology

## 2024-10-30 ENCOUNTER — Telehealth: Payer: Self-pay

## 2024-10-30 DIAGNOSIS — R899 Unspecified abnormal finding in specimens from other organs, systems and tissues: Secondary | ICD-10-CM

## 2024-10-30 NOTE — Telephone Encounter (Signed)
 Lab Results reviewed with pt's spouse as per Dr. Karry note.  Pt verbalized understanding and had no additional questions. Routed to PCP

## 2024-11-03 ENCOUNTER — Telehealth: Payer: Self-pay | Admitting: Cardiology

## 2024-11-03 NOTE — Telephone Encounter (Signed)
 Pt wife states she is taking pt to ED. Pt has blood in urine after starting Eliquis , Please advise.

## 2024-11-06 ENCOUNTER — Other Ambulatory Visit

## 2024-11-07 ENCOUNTER — Other Ambulatory Visit: Payer: Self-pay | Admitting: Physician Assistant

## 2024-11-07 DIAGNOSIS — N200 Calculus of kidney: Secondary | ICD-10-CM

## 2024-11-07 DIAGNOSIS — R310 Gross hematuria: Secondary | ICD-10-CM

## 2024-11-21 ENCOUNTER — Other Ambulatory Visit: Payer: Self-pay | Admitting: Physician Assistant

## 2024-11-21 DIAGNOSIS — M545 Low back pain, unspecified: Secondary | ICD-10-CM

## 2025-01-15 ENCOUNTER — Ambulatory Visit: Admitting: Cardiology

## 2025-03-14 ENCOUNTER — Ambulatory Visit: Admitting: Diagnostic Neuroimaging
